# Patient Record
Sex: Female | Born: 1935
Health system: Southern US, Community
[De-identification: ages and names within clinical notes are randomized; demographics above are authoritative.]

## PROBLEM LIST (undated history)

## (undated) DIAGNOSIS — Z803 Family history of malignant neoplasm of breast: Secondary | ICD-10-CM

## (undated) DIAGNOSIS — C50919 Malignant neoplasm of unspecified site of unspecified female breast: Secondary | ICD-10-CM

## (undated) DIAGNOSIS — R011 Cardiac murmur, unspecified: Secondary | ICD-10-CM

## (undated) DIAGNOSIS — H269 Unspecified cataract: Secondary | ICD-10-CM

## (undated) DIAGNOSIS — K253 Acute gastric ulcer without hemorrhage or perforation: Secondary | ICD-10-CM

## (undated) DIAGNOSIS — M25569 Pain in unspecified knee: Secondary | ICD-10-CM

## (undated) DIAGNOSIS — K2960 Other gastritis without bleeding: Secondary | ICD-10-CM

## (undated) DIAGNOSIS — K649 Unspecified hemorrhoids: Secondary | ICD-10-CM

## (undated) DIAGNOSIS — Z800 Family history of malignant neoplasm of digestive organs: Secondary | ICD-10-CM

## (undated) HISTORY — DX: Family history of malignant neoplasm of breast: Z80.3

## (undated) HISTORY — DX: Family history of malignant neoplasm of digestive organs: Z80.0

## (undated) HISTORY — PX: EYE SURGERY: SHX253

## (undated) HISTORY — DX: Unspecified cataract: H26.9

---
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
## 2002-03-02 ENCOUNTER — Emergency Department (HOSPITAL_COMMUNITY): Admission: EM | Admit: 2002-03-02 | Discharge: 2002-03-02 | Payer: Self-pay | Admitting: Emergency Medicine

## 2002-03-02 ENCOUNTER — Encounter: Payer: Self-pay | Admitting: Emergency Medicine

## 2012-06-09 ENCOUNTER — Encounter (HOSPITAL_BASED_OUTPATIENT_CLINIC_OR_DEPARTMENT_OTHER): Payer: Self-pay | Admitting: Emergency Medicine

## 2012-06-09 ENCOUNTER — Emergency Department (HOSPITAL_BASED_OUTPATIENT_CLINIC_OR_DEPARTMENT_OTHER)
Admission: EM | Admit: 2012-06-09 | Discharge: 2012-06-09 | Disposition: A | Discharge status: Home / Self Care | Payer: Medicare Other | Attending: Emergency Medicine | Admitting: Emergency Medicine

## 2012-06-09 ENCOUNTER — Emergency Department (HOSPITAL_BASED_OUTPATIENT_CLINIC_OR_DEPARTMENT_OTHER): Payer: Medicare Other

## 2012-06-09 DIAGNOSIS — S60229A Contusion of unspecified hand, initial encounter: Secondary | ICD-10-CM | POA: Insufficient documentation

## 2012-06-09 DIAGNOSIS — W010XXA Fall on same level from slipping, tripping and stumbling without subsequent striking against object, initial encounter: Secondary | ICD-10-CM | POA: Insufficient documentation

## 2012-06-09 DIAGNOSIS — Y92009 Unspecified place in unspecified non-institutional (private) residence as the place of occurrence of the external cause: Secondary | ICD-10-CM | POA: Insufficient documentation

## 2012-06-09 HISTORY — DX: Pain in unspecified knee: M25.569

## 2012-06-09 MED ORDER — TETANUS-DIPHTH-ACELL PERTUSSIS 5-2.5-18.5 LF-MCG/0.5 IM SUSP
0.5000 mL | Freq: Once | INTRAMUSCULAR | Status: AC
  Administered 2012-06-09: 0.5 mL via INTRAMUSCULAR

## 2012-06-09 MED ORDER — HYDROCODONE-ACETAMINOPHEN 5-325 MG PO TABS
ORAL_TABLET | ORAL | Status: AC
  Administered 2012-06-09: 2 via ORAL
  Filled 2012-06-09: qty 2

## 2012-06-09 MED ORDER — HYDROCODONE-ACETAMINOPHEN 5-325 MG PO TABS: 2.0000 | ORAL_TABLET | ORAL | Status: AC | PRN

## 2012-06-09 MED ORDER — TETANUS-DIPHTH-ACELL PERTUSSIS 5-2.5-18.5 LF-MCG/0.5 IM SUSP
INTRAMUSCULAR | Status: AC
  Administered 2012-06-09: 0.5 mL via INTRAMUSCULAR
  Filled 2012-06-09: qty 0.5

## 2012-06-09 MED ORDER — HYDROCODONE-ACETAMINOPHEN 5-325 MG PO TABS
2.0000 | ORAL_TABLET | Freq: Once | ORAL | Status: AC
  Administered 2012-06-09: 2 via ORAL

## 2012-06-09 NOTE — ED Notes (Addendum)
Per EMS:  Pt fell in garden today.  Pt lost balance while standing in mud.  Pt fell face forward, hit face, caught herself with hands, no LOC.  Pt relates she had some numbness and tingling in lower extremities.  Husband helped to sitting position, numbness was relieved some.  Upon arrival of EMS:  Pt C&A x 4. Pt stated that she has burning sensation in her hands.  No neuro deficits noted.  Pt had some neck soreness, pt has c collar applied.

## 2012-06-09 NOTE — ED Provider Notes (Signed)
History     CSN: [id number]  Arrival date & time 06/09/12  1152   First MD Initiated Contact with Patient 06/09/12 1310      Chief Complaint  Patient presents with  . Fall    (Consider location/radiation/quality/duration/timing/severity/associated sxs/prior treatment) Patient is a 78 y.o. female presenting with fall. The history is provided by the patient. No language interpreter was used.  Fall The accident occurred 3 to 5 hours ago. Incident: pt slipped in mud in her garder. She fell from a height of 3 to 5 ft. She landed on dirt. There was no blood loss. Point of impact: bilat hands. The pain is present in the neck, right wrist and left wrist. The pain is at a severity of 5/10. The pain is moderate. She was not ambulatory at the scene. There was no entrapment after the fall. There was no drug use involved in the accident. The symptoms are aggravated by pressure on the injury. She has tried nothing for the symptoms.   Pt complains of pain in both forearms and hands.  Pt reports she felt numbness from neck down full body. Pt reports she felt like she could not move her extremities. Past Medical History  Diagnosis Date  . Knee pain     Past Surgical History  Procedure Date  . Eye surgery     No family history on file.  History  Substance Use Topics  . Smoking status: Never Smoker   . Smokeless tobacco: Not on file  . Alcohol Use: No    OB History    Grav Para Term Preterm Abortions TAB SAB Ect Mult Living                  Review of Systems  Musculoskeletal: Positive for myalgias. Negative for joint swelling.  All other systems reviewed and are negative.    Allergies  Sulfa antibiotics  Home Medications  No current outpatient prescriptions on file.  BP 157/56  Pulse 68  Temp 98 F (36.7 C) (Oral)  Resp 18  SpO2 96%  Physical Exam  Nursing note and vitals reviewed. Constitutional: She is oriented to person, place, and time. She appears well-developed and  well-nourished.  HENT:  Head: Normocephalic.  Right Ear: External ear normal.  Left Ear: External ear normal.  Eyes: Conjunctivae are normal. Pupils are equal, round, and reactive to light.  Neck: Normal range of motion. Neck supple.  Pulmonary/Chest: Effort normal and breath sounds normal.  Abdominal: Soft. Bowel sounds are normal.  Musculoskeletal: She exhibits tenderness.  Neurological: She is alert and oriented to person, place, and time. She has normal reflexes.  Skin: Skin is warm.  Psychiatric: She has a normal mood and affect.    ED Course  Procedures (including critical care time)  Labs Reviewed - No data to display No results found.   1. Contusion of hand(s)       MDM  xrays no fractures,  Pt removed from collar.   I advised pt to see her MD for recheck in 2-3 days.  Ice to areas of pain.   Pt given rx for hydrocodone        Lonia Skinner [place], [place] 06/09/12 1534  Lonia Skinner [place], [place] 06/09/12 1538

## 2012-06-09 NOTE — ED Notes (Signed)
Cervical collar removed per verbal order of Langston Masker.

## 2012-06-10 NOTE — ED Provider Notes (Signed)
Medical screening examination/treatment/procedure(s) were performed by non-physician practitioner and as supervising physician I was immediately available for consultation/collaboration.   Adriene Padula, MD 06/10/12 0815 

## 2014-01-27 ENCOUNTER — Ambulatory Visit (INDEPENDENT_AMBULATORY_CARE_PROVIDER_SITE_OTHER): Payer: Medicare Other | Admitting: Family Medicine

## 2014-01-27 VITALS — BP 142/80 | HR 84 | Temp 97.0°F | Resp 18 | Ht 61.0 in | Wt 170.2 lb

## 2014-01-27 DIAGNOSIS — H02823 Cysts of right eye, unspecified eyelid: Secondary | ICD-10-CM

## 2014-01-27 DIAGNOSIS — H44009 Unspecified purulent endophthalmitis, unspecified eye: Secondary | ICD-10-CM

## 2014-01-27 DIAGNOSIS — H02829 Cysts of unspecified eye, unspecified eyelid: Secondary | ICD-10-CM

## 2014-01-27 MED ORDER — OFLOXACIN 0.3 % OP SOLN: 2.0000 [drp] | Freq: Four times a day (QID) | OPHTHALMIC | Status: DC

## 2014-01-27 NOTE — Progress Notes (Signed)
Fax copy of OV for referral

## 2014-01-27 NOTE — Progress Notes (Signed)
Chief Complaint:  Chief Complaint  Patient presents with  . Eye Problem    right eye    HPI: Kathy Thornton is a 80 y.o. female who is here for  Right  Eye swelling x 1 week, she started having rednes on the lid and itchiness, but swelling has worsened, she has used otc eye cream. She though it was a stye and has used warm compresses and used visine drops 2-3 times a day. But the last 2-3 days days it started getting more swollen. She denies any fevers or chills. She has had some fuzziness in her right eye since this occurred. Has had some clear drainage. No facial rashes, HA, light sensitivity. No pain. She has had bilateral cataract surgery from 2012 with Dr Darleen Crocker  Past Medical History  Diagnosis Date  . Knee pain   . Cataract    Past Surgical History  Procedure Laterality Date  . Eye surgery     History   Social History  . Marital Status: Married    Spouse Name: N/A    Number of Children: N/A  . Years of Education: N/A   Social History Main Topics  . Smoking status: Never Smoker   . Smokeless tobacco: None  . Alcohol Use: No  . Drug Use: None  . Sexual Activity: None   Other Topics Concern  . None   Social History Narrative  . None   Family History  Problem Relation Age of Onset  . Heart disease Father   . Heart disease Brother    Allergies  Allergen Reactions  . Sulfa Antibiotics Hives   Prior to Admission medications   Medication Sig Start Date End Date Taking? Authorizing Provider  ibuprofen (ADVIL,MOTRIN) 200 MG tablet Take 400 mg by mouth every 6 (six) hours as needed. Patient used this medication for pain.   Yes Historical Provider, MD  aspirin 325 MG tablet Take 325 mg by mouth daily.    Historical Provider, MD     ROS: The patient denies fevers, chills, night sweats, unintentional weight loss, chest pain, palpitations, wheezing, dyspnea on exertion, nausea, vomiting, abdominal pain, dysuria, hematuria, melena, numbness, weakness,  or tingling.   All other systems have been reviewed and were otherwise negative with the exception of those mentioned in the HPI and as above.    PHYSICAL EXAM: Filed Vitals:   01/27/14 0931  BP: 142/80  Pulse: 84  Temp: 97 F (36.1 C)  Resp: 18   Filed Vitals:   01/27/14 0931  Height: 5\' 1"  (1.549 m)  Weight: 170 lb 3.2 oz (77.202 kg)   Body mass index is 32.18 kg/(m^2).  General: Alert, no acute distress HEENT:  Normocephalic, atraumatic, oropharynx patent. EOMI, PERRLA, +right upper eyelid swelling, Fundo exam nl. + 1.5 by 1.5 cm erythematous cyst. ? stye Cardiovascular:  Regular rate and rhythm, no rubs murmurs or gallops.  No Carotid bruits, radial pulse intact. No pedal edema.  Respiratory: Clear to auscultation bilaterally.  No wheezes, rales, or rhonchi.  No cyanosis, no use of accessory musculature GI: No organomegaly, abdomen is soft and non-tender, positive bowel sounds.  No masses. Skin: No rashes. Neurologic: Facial musculature symmetric. Psychiatric: Patient is appropriate throughout our interaction. Lymphatic: No cervical lymphadenopathy Musculoskeletal: Gait intact.   LABS: No results found for this or any previous visit.   EKG/XRAY:   Primary read interpreted by Dr. Marin Comment at [facility].   ASSESSMENT/PLAN: Encounter Diagnoses  Name Primary?  Marland Kitchen  Cyst of right eyelid Yes  . Eye infection    Urgent referral to Dr Talbert Forest Attempted to call but no one picked up in his office She would like to return to see him and not another optho Rx Ocuflox for ppx against infection If she does not hear from [facility] office in 1-2 days then she needs to let me know so I can do referral somewhere else F/u prn  Gross sideeffects, risk and benefits, and alternatives of medications d/w patient. Patient is aware that all medications have potential sideeffects and we are unable to predict every sideeffect or drug-drug interaction that may occur.  LE, [place], DO 01/27/2014 10:17  AM

## 2015-08-12 ENCOUNTER — Ambulatory Visit: Payer: Self-pay | Admitting: Family Medicine

## 2015-09-09 ENCOUNTER — Ambulatory Visit: Payer: Self-pay | Admitting: Family Medicine

## 2015-11-04 ENCOUNTER — Ambulatory Visit (INDEPENDENT_AMBULATORY_CARE_PROVIDER_SITE_OTHER): Payer: Medicare Other | Admitting: Family Medicine

## 2015-11-04 ENCOUNTER — Observation Stay (HOSPITAL_COMMUNITY)
Admission: EM | Admit: 2015-11-04 | Discharge: 2015-11-06 | Disposition: A | Discharge status: Home / Self Care | Payer: Medicare Other | Attending: Internal Medicine | Admitting: Internal Medicine

## 2015-11-04 ENCOUNTER — Ambulatory Visit (INDEPENDENT_AMBULATORY_CARE_PROVIDER_SITE_OTHER): Payer: Medicare Other

## 2015-11-04 ENCOUNTER — Encounter (HOSPITAL_COMMUNITY): Payer: Self-pay | Admitting: Emergency Medicine

## 2015-11-04 VITALS — BP 138/62 | HR 65 | Temp 98.6°F | Resp 16 | Ht 60.5 in | Wt 173.0 lb

## 2015-11-04 DIAGNOSIS — R3915 Urgency of urination: Secondary | ICD-10-CM | POA: Diagnosis not present

## 2015-11-04 DIAGNOSIS — R011 Cardiac murmur, unspecified: Secondary | ICD-10-CM | POA: Diagnosis not present

## 2015-11-04 DIAGNOSIS — N390 Urinary tract infection, site not specified: Secondary | ICD-10-CM | POA: Diagnosis not present

## 2015-11-04 DIAGNOSIS — K640 First degree hemorrhoids: Secondary | ICD-10-CM | POA: Diagnosis not present

## 2015-11-04 DIAGNOSIS — K449 Diaphragmatic hernia without obstruction or gangrene: Secondary | ICD-10-CM | POA: Diagnosis not present

## 2015-11-04 DIAGNOSIS — R351 Nocturia: Secondary | ICD-10-CM | POA: Diagnosis not present

## 2015-11-04 DIAGNOSIS — Z791 Long term (current) use of non-steroidal anti-inflammatories (NSAID): Secondary | ICD-10-CM | POA: Insufficient documentation

## 2015-11-04 DIAGNOSIS — M25562 Pain in left knee: Secondary | ICD-10-CM

## 2015-11-04 DIAGNOSIS — Z79899 Other long term (current) drug therapy: Secondary | ICD-10-CM | POA: Insufficient documentation

## 2015-11-04 DIAGNOSIS — H269 Unspecified cataract: Secondary | ICD-10-CM | POA: Diagnosis not present

## 2015-11-04 DIAGNOSIS — K259 Gastric ulcer, unspecified as acute or chronic, without hemorrhage or perforation: Secondary | ICD-10-CM | POA: Insufficient documentation

## 2015-11-04 DIAGNOSIS — D509 Iron deficiency anemia, unspecified: Secondary | ICD-10-CM | POA: Diagnosis not present

## 2015-11-04 DIAGNOSIS — Z130 Encounter for screening for diseases of the blood and blood-forming organs and certain disorders involving the immune mechanism: Secondary | ICD-10-CM

## 2015-11-04 DIAGNOSIS — M25561 Pain in right knee: Secondary | ICD-10-CM | POA: Diagnosis not present

## 2015-11-04 DIAGNOSIS — K2950 Unspecified chronic gastritis without bleeding: Secondary | ICD-10-CM | POA: Insufficient documentation

## 2015-11-04 DIAGNOSIS — Z23 Encounter for immunization: Secondary | ICD-10-CM | POA: Diagnosis not present

## 2015-11-04 DIAGNOSIS — K2980 Duodenitis without bleeding: Secondary | ICD-10-CM | POA: Insufficient documentation

## 2015-11-04 DIAGNOSIS — M170 Bilateral primary osteoarthritis of knee: Secondary | ICD-10-CM

## 2015-11-04 DIAGNOSIS — Z131 Encounter for screening for diabetes mellitus: Secondary | ICD-10-CM | POA: Diagnosis not present

## 2015-11-04 DIAGNOSIS — M129 Arthropathy, unspecified: Secondary | ICD-10-CM

## 2015-11-04 DIAGNOSIS — R739 Hyperglycemia, unspecified: Secondary | ICD-10-CM | POA: Diagnosis not present

## 2015-11-04 DIAGNOSIS — N3000 Acute cystitis without hematuria: Secondary | ICD-10-CM

## 2015-11-04 DIAGNOSIS — K2960 Other gastritis without bleeding: Secondary | ICD-10-CM | POA: Diagnosis present

## 2015-11-04 DIAGNOSIS — K649 Unspecified hemorrhoids: Secondary | ICD-10-CM | POA: Diagnosis present

## 2015-11-04 DIAGNOSIS — D649 Anemia, unspecified: Secondary | ICD-10-CM | POA: Diagnosis not present

## 2015-11-04 DIAGNOSIS — R600 Localized edema: Secondary | ICD-10-CM | POA: Insufficient documentation

## 2015-11-04 DIAGNOSIS — R0989 Other specified symptoms and signs involving the circulatory and respiratory systems: Secondary | ICD-10-CM | POA: Diagnosis present

## 2015-11-04 DIAGNOSIS — K253 Acute gastric ulcer without hemorrhage or perforation: Secondary | ICD-10-CM | POA: Diagnosis present

## 2015-11-04 HISTORY — DX: Other gastritis without bleeding: K29.60

## 2015-11-04 HISTORY — DX: Unspecified hemorrhoids: K64.9

## 2015-11-04 HISTORY — DX: Acute gastric ulcer without hemorrhage or perforation: K25.3

## 2015-11-04 LAB — CBC
HCT: 17.9 % — ABNORMAL LOW (ref 36.0–46.0)
Hemoglobin: 4.9 g/dL — CL (ref 12.0–15.0)
MCH: 17.9 pg — AB (ref 26.0–34.0)
MCHC: 27.4 g/dL — AB (ref 30.0–36.0)
MCV: 65.3 fL — AB (ref 78.0–100.0)
MPV: 9.5 fL (ref 8.6–12.4)
PLATELETS: 247 10*3/uL (ref 150–400)
RBC: 2.74 MIL/uL — ABNORMAL LOW (ref 3.87–5.11)
RDW: 19.3 % — ABNORMAL HIGH (ref 11.5–15.5)
WBC: 5.1 10*3/uL (ref 4.0–10.5)

## 2015-11-04 LAB — POCT URINALYSIS DIP (MANUAL ENTRY)
BILIRUBIN UA: NEGATIVE
BILIRUBIN UA: NEGATIVE
Glucose, UA: NEGATIVE
Nitrite, UA: POSITIVE — AB
PH UA: 5
PROTEIN UA: NEGATIVE
SPEC GRAV UA: 1.02
Urobilinogen, UA: 0.2

## 2015-11-04 LAB — COMPREHENSIVE METABOLIC PANEL
ALK PHOS: 80 U/L (ref 33–130)
ALT: 8 U/L (ref 6–29)
AST: 10 U/L (ref 10–35)
Albumin: 3.9 g/dL (ref 3.6–5.1)
BILIRUBIN TOTAL: 0.3 mg/dL (ref 0.2–1.2)
BUN: 16 mg/dL (ref 7–25)
CO2: 19 mmol/L — ABNORMAL LOW (ref 20–31)
CREATININE: 1.18 mg/dL — AB (ref 0.60–0.93)
Calcium: 8.7 mg/dL (ref 8.6–10.4)
Chloride: 107 mmol/L (ref 98–110)
Glucose, Bld: 87 mg/dL (ref 65–99)
Potassium: 4.2 mmol/L (ref 3.5–5.3)
SODIUM: 137 mmol/L (ref 135–146)
TOTAL PROTEIN: 6.7 g/dL (ref 6.1–8.1)

## 2015-11-04 LAB — POC MICROSCOPIC URINALYSIS (UMFC): Mucus: ABSENT

## 2015-11-04 LAB — GLUCOSE, POCT (MANUAL RESULT ENTRY): POC GLUCOSE: 128 mg/dL — AB (ref 70–99)

## 2015-11-04 MED ORDER — MELOXICAM 7.5 MG PO TABS: 7.5000 mg | ORAL_TABLET | Freq: Every day | ORAL | Status: DC

## 2015-11-04 MED ORDER — TRAMADOL HCL 50 MG PO TABS: 50.0000 mg | ORAL_TABLET | Freq: Three times a day (TID) | ORAL | Status: DC | PRN

## 2015-11-04 MED ORDER — CEPHALEXIN 500 MG PO CAPS: 500.0000 mg | ORAL_CAPSULE | Freq: Two times a day (BID) | ORAL | Status: DC

## 2015-11-04 NOTE — ED Notes (Signed)
Pt sent by MD Edilia Bo for low hemoglobin. Was told tonight that "she needed blood right now and could not wait until the morning." Denies bleeding from rectum or hematuria. Has had intermittent dizzy spells recently. Pt reports increased fatigue recently. No other c/c. A&Ox4.

## 2015-11-04 NOTE — Progress Notes (Addendum)
[facility] [address], [place] [address] [phone]  Date:  11/04/2015   Name:  Kathy Thornton   DOB:  20-Jan-1934   MRN:  [id number]  PCP:  No primary care provider on file.    Chief Complaint: Follow-up; Diabetes; knee pain; and urination   History of Present Illness:  Kathy Thornton is a 82 y.o. very pleasant female patient who presents with the following:  Here today with a few concerns. They are concerned that she could have hyperglycemia and would like [place] to check her glucose She has noted nocturia- over the last 3 months she has noted 1-2 episodes of nocturia nightly.  She feels like her urine does not come out enough- like she has to go again shortly after voiding.   No dysuria No hematuria.    Also she has noted some knee pain- both knees hurt, the right is worse than the left She also has some sciatica on the right.  Her knees have hurt "for a long time."   She feels certain that she has arthritis.  She uses a cane but is able to get around well still.    She does not take any medications.  She has been generally healthy and does not get regular medical care.    There are no active problems to display for this patient.   Past Medical History  Diagnosis Date  . Knee pain   . Cataract     Past Surgical History  Procedure Laterality Date  . Eye surgery      Social History  Substance Use Topics  . Smoking status: Never Smoker   . Smokeless tobacco: Not on file  . Alcohol Use: No    Family History  Problem Relation Age of Onset  . Heart disease Father   . Heart disease Brother     Allergies  Allergen Reactions  . Sulfa Antibiotics Hives    Medication list has been reviewed and updated.  Current Outpatient Prescriptions on File Prior to Visit  Medication Sig Dispense Refill  . ibuprofen (ADVIL,MOTRIN) 200 MG tablet Take 400 mg by mouth every 6 (six) hours as needed. Patient used this medication for pain.    Marland Kitchen aspirin  325 MG tablet Take 325 mg by mouth daily.     No current facility-administered medications on file prior to visit.    Review of Systems:  As per HPI- otherwise negative.   Physical Examination: Filed Vitals:   11/04/15 1312  BP: 138/62  Pulse: 65  Temp: 98.6 F (37 C)  Resp: 16   Filed Vitals:   11/04/15 1312  Height: 5' 0.5" (1.537 m)  Weight: 173 lb (78.472 kg)   Body mass index is 33.22 kg/(m^2). Ideal Body Weight: Weight in (lb) to have BMI = 25: 129.9  GEN: WDWN, NAD, Non-toxic, A & O x 3, overweight, looks well HEENT: Atraumatic, Normocephalic. Neck supple. No masses, No LAD.  Bilateral TM wnl, oropharynx normal.  PEERL,EOMI.   Ears and Nose: No external deformity. CV: RRR, No M/G/R. No JVD. No thrill. No extra heart sounds. PULM: CTA B, no wheezes, crackles, rhonchi. No retractions. No resp. distress. No accessory muscle use. EXTR: No c/c/e NEURO uses a cane, no visible limp but she walks somewhat slowly PSYCH: Normally interactive. Conversant. Not depressed or anxious appearing.  Calm demeanor.  Right knee: crepitus, knee is stable, no effusion.  Negative SLR Left knee:crepitus, stable, no effusion  [facility] reading (  PRIMARY) by  Dr. Lorelei Pont. Right knee: severe medial compartment OA, moderate OA in other compartments Left knee: severe OA in all compartments   Results for orders placed or performed in visit on 11/04/15  POCT glucose (manual entry)  Result Value Ref Range   POC Glucose 128 (A) 70 - 99 mg/dl  POCT urinalysis dipstick  Result Value Ref Range   Color, UA yellow yellow   Clarity, UA clear clear   Glucose, UA negative negative   Bilirubin, UA negative negative   Ketones, POC UA negative negative   Spec Grav, UA 1.020    Blood, UA small (A) negative   pH, UA 5.0    Protein Ur, POC negative negative   Urobilinogen, UA 0.2    Nitrite, UA Positive (A) Negative   Leukocytes, UA large (3+) (A) Negative  POCT Microscopic Urinalysis ([facility])  Result  Value Ref Range   WBC,UR,HPF,POC Moderate (A) None WBC/hpf   RBC,UR,HPF,POC Moderate (A) None RBC/hpf   Bacteria Few (A) None, Too numerous to count   Mucus Absent Absent   Epithelial Cells, UR Per Microscopy Few (A) None, Too numerous to count cells/hpf    Assessment and Plan: Arthralgia of both knees - Plan: DG Knee Complete 4 Views Right, DG Knee Complete 4 Views Left, meloxicam (MOBIC) 7.5 MG tablet, traMADol (ULTRAM) 50 MG tablet, Ambulatory referral to Orthopedic Surgery  Nocturia - Plan: Urine culture  Urinary urgency - Plan: POCT urinalysis dipstick, POCT Microscopic Urinalysis ([facility]), Urine culture, cephALEXin (KEFLEX) 500 MG capsule  Screening for diabetes mellitus - Plan: Comprehensive metabolic panel, POCT glucose (manual entry), Hemoglobin A1c, CANCELED: POCT glycosylated hemoglobin (Hb A1C)  Screening for deficiency anemia - Plan: CBC  Immunization due - Plan: Flu Vaccine QUAD 36+ mos IM, Pneumococcal conjugate vaccine 13-valent IM  Severe OA of her knees. She might be interested in surgery, ?synvisc.  Will refer to ortho No history of CV or GI problems- will rx mobic for prn use and tramadol for more severe pain DM seems unlikely but await A1c Treat for a UTI with keflex, await culture Immunizations and labs today  Signed Lamar Blinks, MD  Received a call from [facility] around 9:30 pm- critically low hg of 4.9. Called the only number that we have for pt- it is her home number.  No answer- LMOM that she needs to proceed to the ER for treatment right away.   Called twice more- no answer.  Then was able to reach her at approx 10pm- discussed. Her anemia is quite unexpected.  I recommended that she go to the ER now to have this rechecked and evaluated/ treated if real.  Advised her NOT to use any further mobic until this is sorted out.  She states understanding  Lab Results  Component Value Date   WBC 5.1 11/04/2015   HGB 4.9* 11/04/2015   HCT 17.9* 11/04/2015   MCV  65.3* 11/04/2015   PLT 247 11/04/2015   12/3-  Received her culture- she has been admitted for the last few days for anemia Her urine culture does not specify if her particular organism is sensitive to keflex- will change her to augmentin to be sure.  LMOM- stop the keflex and start augmentin for 5 days

## 2015-11-04 NOTE — Patient Instructions (Addendum)
I will be in touch with the rest of your labs. Your random glucose looks ok but I will check an A1c test to get more information and make sure that you do not have diabetes.  It does appear that you have a urinary tract infection which may be causing your urinary symptoms.   You have severe arthritis in your knees.  We will refer you to orthopedics to look at your knees In the meantime use the mobic as needed for everyday pain, and the tramadol as needed for more severe pain Try to keep moving- this will help you knees

## 2015-11-04 NOTE — ED Provider Notes (Signed)
CSN: [id number]     Arrival date & time 11/04/15  2254 History  By signing my name below, I, Meriel Pica, attest that this documentation has been prepared under the direction and in the presence of Everlene Balls, MD. Electronically Signed: Meriel Pica, ED Scribe. 11/04/2015. 11:23 PM.  Chief Complaint  Patient presents with  . Low Hemoglobin   . Sent by MD    The history is provided by the patient. No language interpreter was used.   HPI Comments: Kathy Thornton is a 81 y.o. female who presents to the Emergency Department sent by her PCP Dr. Lorelei Pont for evaluation of low hemoglobin that resulted at 4.9 from labs obtained at PCP visit today. Pt reports she was evaluated at Dr. Lillie Fragmin office today for a yearly physical when blood work was drawn and she received a call from her PCP approximately 1 hour ago to present to the ED for recheck of hg lab and possible treatment. Daughter also notes the pt was found to have a mild UTI but her A1C was within normal limits. Pt reports associated waxing and waning fatigue recently but has been able to go about her daily routine. She is not on blood thinning medication. Denies melena, hematochezia, hematuria, or any other associated symptoms. The pt has arthralgias in bilateral knees at baseline due to arthritis. Denies any recent SOB or light-headedness.   Past Medical History  Diagnosis Date  . Knee pain   . Cataract    Past Surgical History  Procedure Laterality Date  . Eye surgery     Family History  Problem Relation Age of Onset  . Heart disease Father   . Heart disease Brother    Social History  Substance Use Topics  . Smoking status: Never Smoker   . Smokeless tobacco: None  . Alcohol Use: No   OB History    No data available     Review of Systems  Constitutional: Positive for fatigue. Negative for fever.  Respiratory: Negative for shortness of breath.   Gastrointestinal: Negative for blood in stool.  Genitourinary:  Negative for hematuria.  Musculoskeletal: Positive for arthralgias ( B/L kness).  Neurological: Negative for light-headedness.  Hematological: Does not bruise/bleed easily.   10 Systems reviewed and all are negative for acute change except as noted in the HPI.  Allergies  Sulfa antibiotics  Home Medications   Prior to Admission medications   Medication Sig Start Date End Date Taking? Authorizing Provider  Aspirin-Acetaminophen-Caffeine (GOODY HEADACHE PO) Take 2 Packages by mouth 2 (two) times daily as needed (pain).   Yes Historical Provider, MD  cephALEXin (KEFLEX) 500 MG capsule Take 1 capsule (500 mg total) by mouth 2 (two) times daily. Patient taking differently: Take 500 mg by mouth 2 (two) times daily. For 7 days 11-30 to 12-7 11/04/15  Yes Jessica C Copland, MD  Cyanocobalamin (VITAMIN B 12 PO) Take 1 tablet by mouth daily.    Yes Historical Provider, MD  glucosamine-chondroitin 500-400 MG tablet Take 1 tablet by mouth daily.   Yes Historical Provider, MD  ibuprofen (ADVIL,MOTRIN) 200 MG tablet Take 400 mg by mouth every 6 (six) hours as needed for moderate pain.   Yes Historical Provider, MD  meloxicam (MOBIC) 7.5 MG tablet Take 1 tablet (7.5 mg total) by mouth daily. Use as needed for pain 11/04/15  Yes Gay Filler Copland, MD  Multiple Vitamins-Minerals (HAIR/SKIN/NAILS PO) Take 1 tablet by mouth 3 (three) times daily.   Yes Historical Provider, MD  traMADol (ULTRAM) 50 MG tablet Take 1 tablet (50 mg total) by mouth every 8 (eight) hours as needed. Use for more severe knee pain Patient taking differently: Take 50 mg by mouth every 8 (eight) hours as needed for moderate pain.  11/04/15   Gay Filler Copland, MD   BP 162/48 mmHg  Pulse 85  Temp(Src) 98.1 F (36.7 C) (Oral)  Resp 16  SpO2 100% Physical Exam  Constitutional: She is oriented to person, place, and time. She appears well-developed and well-nourished. No distress.  HENT:  Head: Normocephalic and atraumatic.  Nose:  Nose normal.  Mouth/Throat: Oropharynx is clear and moist. No oropharyngeal exudate.  Eyes: EOM are normal. Pupils are equal, round, and reactive to light. No scleral icterus.  Pale conjunctiva bilaterally.   Neck: Normal range of motion. Neck supple. No JVD present. No tracheal deviation present. No thyromegaly present.  Cardiovascular: Normal rate, regular rhythm and normal heart sounds.  Exam reveals no gallop and no friction rub.   No murmur heard. Pulmonary/Chest: Effort normal and breath sounds normal. No respiratory distress. She has no wheezes. She exhibits no tenderness.  Abdominal: Soft. Bowel sounds are normal. She exhibits no distension and no mass. There is no tenderness. There is no rebound and no guarding.  Genitourinary: Guaiac negative stool.  Hemoccult negative.   Musculoskeletal: Normal range of motion. She exhibits no edema or tenderness.  Lymphadenopathy:    She has no cervical adenopathy.  Neurological: She is alert and oriented to person, place, and time. No cranial nerve deficit. She exhibits normal muscle tone.  Skin: Skin is warm and dry. No rash noted. No erythema. No pallor.  Nursing note and vitals reviewed.   ED Course  Procedures  DIAGNOSTIC STUDIES: Oxygen Saturation is 100% on RA, normal by my interpretation.    COORDINATION OF CARE: 11:20 PM Discussed treatment plan which includes to order diagnostic labs with pt. Pt acknowledges and agrees to plan.  12:17 AM Labs resulted. Hemoglobin at 5.1. Will order blood transfusion and consult with medicine.   Labs Review Labs Reviewed  CBC WITH DIFFERENTIAL/PLATELET - Abnormal; Notable for the following:    RBC 2.82 (*)    Hemoglobin 5.1 (*)    HCT 19.5 (*)    MCV 69.1 (*)    MCH 18.1 (*)    MCHC 26.2 (*)    RDW 19.6 (*)    All other components within normal limits  COMPREHENSIVE METABOLIC PANEL  MAGNESIUM  PHOSPHORUS  IRON AND TIBC  RETICULOCYTES  VITAMIN B12  FOLATE  FERRITIN  POC OCCULT BLOOD,  ED  TYPE AND SCREEN  ABO/RH  PREPARE RBC (CROSSMATCH)    Imaging Review Dg Knee Complete 4 Views Left  11/04/2015  CLINICAL DATA:  Left knee pain and arthralgia.  No known injury. EXAM: LEFT KNEE - COMPLETE 4+ VIEW COMPARISON:  None. FINDINGS: There is no evidence of fracture, dislocation, or joint effusion. Severe tricompartmental osteoarthritis is seen. No other bone lesions identified. IMPRESSION: No acute findings. Severe tricompartmental osteoarthritis. Electronically Signed   By: Earle Gell M.D.   On: 11/04/2015 16:36   Dg Knee Complete 4 Views Right  11/04/2015  CLINICAL DATA:  Right knee pain. Right knee arthralgia. No known injury. EXAM: RIGHT KNEE - COMPLETE 4+ VIEW COMPARISON:  None. FINDINGS: There is no evidence of fracture, dislocation, or joint effusion. Severe tricompartmental osteoarthritis is seen. No other bone lesions identified. IMPRESSION: No acute findings. Severe tricompartmental osteoarthritis. Electronically Signed   By: Jenny Reichmann  Kris Hartmann M.D.   On: 11/04/2015 16:35   I have personally reviewed and evaluated these images and lab results as part of my medical decision-making.   MDM   Final diagnoses:  None   Patient presents to the ED for low hgb at PCP office.  It was repeated here and found to be 5.1.  2 units have been ordered and patient will be admitted to the hospitalist for further care.   CRITICAL CARE Performed by: Everlene Balls   Total critical care time: 40 minutes - anemia with transfusion  Critical care time was exclusive of separately billable procedures and treating other patients.  Critical care was necessary to treat or prevent imminent or life-threatening deterioration.  Critical care was time spent personally by me on the following activities: development of treatment plan with patient and/or surrogate as well as nursing, discussions with consultants, evaluation of patient's response to treatment, examination of patient, obtaining history from  patient or surrogate, ordering and performing treatments and interventions, ordering and review of laboratory studies, ordering and review of radiographic studies, pulse oximetry and re-evaluation of patient's condition.   I personally performed the services described in this documentation, which was scribed in my presence. The recorded information has been reviewed and is accurate.     Everlene Balls, MD 11/05/15 [phone]

## 2015-11-05 ENCOUNTER — Observation Stay (HOSPITAL_COMMUNITY): Payer: Medicare Other

## 2015-11-05 ENCOUNTER — Encounter (HOSPITAL_COMMUNITY): Payer: Self-pay | Admitting: Internal Medicine

## 2015-11-05 DIAGNOSIS — K259 Gastric ulcer, unspecified as acute or chronic, without hemorrhage or perforation: Secondary | ICD-10-CM | POA: Diagnosis not present

## 2015-11-05 DIAGNOSIS — R011 Cardiac murmur, unspecified: Secondary | ICD-10-CM | POA: Diagnosis present

## 2015-11-05 DIAGNOSIS — N390 Urinary tract infection, site not specified: Secondary | ICD-10-CM

## 2015-11-05 DIAGNOSIS — D509 Iron deficiency anemia, unspecified: Secondary | ICD-10-CM | POA: Diagnosis not present

## 2015-11-05 DIAGNOSIS — D649 Anemia, unspecified: Secondary | ICD-10-CM | POA: Diagnosis present

## 2015-11-05 DIAGNOSIS — K449 Diaphragmatic hernia without obstruction or gangrene: Secondary | ICD-10-CM | POA: Diagnosis not present

## 2015-11-05 DIAGNOSIS — K640 First degree hemorrhoids: Secondary | ICD-10-CM | POA: Diagnosis not present

## 2015-11-05 DIAGNOSIS — R600 Localized edema: Secondary | ICD-10-CM | POA: Diagnosis present

## 2015-11-05 DIAGNOSIS — R0989 Other specified symptoms and signs involving the circulatory and respiratory systems: Secondary | ICD-10-CM | POA: Diagnosis present

## 2015-11-05 LAB — CBC WITH DIFFERENTIAL/PLATELET
BASOS ABS: 0.1 10*3/uL (ref 0.0–0.1)
Basophils Absolute: 0.1 10*3/uL (ref 0.0–0.1)
Basophils Relative: 1 %
Basophils Relative: 1 %
EOS PCT: 3 %
Eosinophils Absolute: 0.2 10*3/uL (ref 0.0–0.7)
Eosinophils Absolute: 0.2 10*3/uL (ref 0.0–0.7)
Eosinophils Relative: 4 %
HEMATOCRIT: 19.5 % — AB (ref 36.0–46.0)
HEMATOCRIT: 26.9 % — AB (ref 36.0–46.0)
Hemoglobin: 5.1 g/dL — CL (ref 12.0–15.0)
Hemoglobin: 7.7 g/dL — ABNORMAL LOW (ref 12.0–15.0)
LYMPHS ABS: 1.8 10*3/uL (ref 0.7–4.0)
LYMPHS ABS: 1.1 10*3/uL (ref 0.7–4.0)
LYMPHS PCT: 30 %
LYMPHS PCT: 18 %
MCH: 18.1 pg — AB (ref 26.0–34.0)
MCH: 20.7 pg — ABNORMAL LOW (ref 26.0–34.0)
MCHC: 26.2 g/dL — AB (ref 30.0–36.0)
MCHC: 28.6 g/dL — ABNORMAL LOW (ref 30.0–36.0)
MCV: 69.1 fL — AB (ref 78.0–100.0)
MCV: 72.3 fL — ABNORMAL LOW (ref 78.0–100.0)
MONOS PCT: 9 %
MONOS PCT: 10 %
Monocytes Absolute: 0.5 10*3/uL (ref 0.1–1.0)
Monocytes Absolute: 0.6 10*3/uL (ref 0.1–1.0)
NEUTROS PCT: 67 %
Neutro Abs: 3.5 10*3/uL (ref 1.7–7.7)
Neutro Abs: 4.1 10*3/uL (ref 1.7–7.7)
Neutrophils Relative %: 57 %
Platelets: 222 10*3/uL (ref 150–400)
Platelets: 198 10*3/uL (ref 150–400)
RBC: 2.82 MIL/uL — AB (ref 3.87–5.11)
RBC: 3.72 MIL/uL — AB (ref 3.87–5.11)
RDW: 19.6 % — AB (ref 11.5–15.5)
RDW: 20.1 % — AB (ref 11.5–15.5)
WBC: 6.1 10*3/uL (ref 4.0–10.5)
WBC: 6.1 10*3/uL (ref 4.0–10.5)

## 2015-11-05 LAB — URINE MICROSCOPIC-ADD ON

## 2015-11-05 LAB — URINALYSIS, ROUTINE W REFLEX MICROSCOPIC
Bilirubin Urine: NEGATIVE
Glucose, UA: NEGATIVE mg/dL
KETONES UR: NEGATIVE mg/dL
NITRITE: NEGATIVE
PH: 5.5 (ref 5.0–8.0)
PROTEIN: NEGATIVE mg/dL
Specific Gravity, Urine: 1.007 (ref 1.005–1.030)

## 2015-11-05 LAB — RETICULOCYTES
RBC.: 2.59 MIL/uL — AB (ref 3.87–5.11)
RETIC CT PCT: 1.7 % (ref 0.4–3.1)
Retic Count, Absolute: 44 10*3/uL (ref 19.0–186.0)

## 2015-11-05 LAB — COMPREHENSIVE METABOLIC PANEL
ALBUMIN: 3.8 g/dL (ref 3.5–5.0)
ALT: 10 U/L — ABNORMAL LOW (ref 14–54)
ALT: 11 U/L — ABNORMAL LOW (ref 14–54)
ANION GAP: 6 (ref 5–15)
ANION GAP: 8 (ref 5–15)
AST: 14 U/L — AB (ref 15–41)
AST: 15 U/L (ref 15–41)
Albumin: 4 g/dL (ref 3.5–5.0)
Alkaline Phosphatase: 77 U/L (ref 38–126)
Alkaline Phosphatase: 84 U/L (ref 38–126)
BILIRUBIN TOTAL: 0.9 mg/dL (ref 0.3–1.2)
BUN: 15 mg/dL (ref 6–20)
BUN: 21 mg/dL — AB (ref 6–20)
CHLORIDE: 111 mmol/L (ref 101–111)
CO2: 20 mmol/L — AB (ref 22–32)
CO2: 22 mmol/L (ref 22–32)
Calcium: 8.8 mg/dL — ABNORMAL LOW (ref 8.9–10.3)
Calcium: 9 mg/dL (ref 8.9–10.3)
Chloride: 112 mmol/L — ABNORMAL HIGH (ref 101–111)
Creatinine, Ser: 1.12 mg/dL — ABNORMAL HIGH (ref 0.44–1.00)
Creatinine, Ser: 0.99 mg/dL (ref 0.44–1.00)
GFR calc Af Amer: 53 mL/min — ABNORMAL LOW (ref >60)
GFR calc non Af Amer: 45 mL/min — ABNORMAL LOW (ref >60)
GFR, EST NON AFRICAN AMERICAN: 53 mL/min — AB (ref >60)
GLUCOSE: 101 mg/dL — AB (ref 65–99)
Glucose, Bld: 101 mg/dL — ABNORMAL HIGH (ref 65–99)
POTASSIUM: 4 mmol/L (ref 3.5–5.1)
POTASSIUM: 4 mmol/L (ref 3.5–5.1)
SODIUM: 139 mmol/L (ref 135–145)
Sodium: 140 mmol/L (ref 135–145)
TOTAL PROTEIN: 6.8 g/dL (ref 6.5–8.1)
TOTAL PROTEIN: 7 g/dL (ref 6.5–8.1)
Total Bilirubin: 0.5 mg/dL (ref 0.3–1.2)

## 2015-11-05 LAB — VITAMIN B12: Vitamin B-12: 469 pg/mL (ref 180–914)

## 2015-11-05 LAB — PHOSPHORUS: Phosphorus: 2.9 mg/dL (ref 2.5–4.6)

## 2015-11-05 LAB — FERRITIN: Ferritin: 3 ng/mL — ABNORMAL LOW (ref 11–307)

## 2015-11-05 LAB — FOLATE: FOLATE: 21.1 ng/mL (ref >5.9)

## 2015-11-05 LAB — MAGNESIUM: Magnesium: 2.2 mg/dL (ref 1.7–2.4)

## 2015-11-05 LAB — IRON AND TIBC
IRON: 14 ug/dL — AB (ref 28–170)
SATURATION RATIOS: 3 % — AB (ref 10.4–31.8)
TIBC: 545 ug/dL — AB (ref 250–450)
UIBC: 531 ug/dL

## 2015-11-05 LAB — ABO/RH: ABO/RH(D): A POS

## 2015-11-05 LAB — PREPARE RBC (CROSSMATCH)

## 2015-11-05 MED ORDER — PANTOPRAZOLE SODIUM 40 MG IV SOLR
40.0000 mg | Freq: Two times a day (BID) | INTRAVENOUS | Status: DC
  Administered 2015-11-05 (×2): 40 mg via INTRAVENOUS
  Filled 2015-11-05 (×4): qty 40

## 2015-11-05 MED ORDER — CIPROFLOXACIN HCL 250 MG PO TABS
250.0000 mg | ORAL_TABLET | Freq: Two times a day (BID) | ORAL | Status: DC
  Administered 2015-11-05: 250 mg via ORAL
  Filled 2015-11-05 (×4): qty 1

## 2015-11-05 MED ORDER — TRAMADOL HCL 50 MG PO TABS: 50.0000 mg | ORAL_TABLET | Freq: Three times a day (TID) | ORAL | Status: DC | PRN

## 2015-11-05 MED ORDER — SODIUM CHLORIDE 0.9 % IV SOLN: 250.0000 mg | INTRAVENOUS | Status: DC

## 2015-11-05 MED ORDER — SODIUM CHLORIDE 0.9 % IV SOLN
INTRAVENOUS | Status: DC
  Administered 2015-11-05 (×2): via INTRAVENOUS

## 2015-11-05 MED ORDER — ACETAMINOPHEN 325 MG PO TABS
650.0000 mg | ORAL_TABLET | Freq: Once | ORAL | Status: AC
Start: 2015-11-05 — End: 2015-11-05
  Administered 2015-11-05: 650 mg via ORAL
  Filled 2015-11-05: qty 2

## 2015-11-05 MED ORDER — DIPHENHYDRAMINE HCL 25 MG PO CAPS: 25.0000 mg | ORAL_CAPSULE | Freq: Once | ORAL | Status: DC

## 2015-11-05 MED ORDER — SODIUM CHLORIDE 0.9 % IV SOLN
250.0000 mg | Freq: Once | INTRAVENOUS | Status: AC
  Administered 2015-11-05: 250 mg via INTRAVENOUS
  Filled 2015-11-05: qty 20

## 2015-11-05 MED ORDER — FUROSEMIDE 10 MG/ML IJ SOLN
20.0000 mg | Freq: Once | INTRAMUSCULAR | Status: AC
  Administered 2015-11-05: 20 mg via INTRAVENOUS
  Filled 2015-11-05: qty 2

## 2015-11-05 MED ORDER — DEXTROSE 5 % IV SOLN
1.0000 g | INTRAVENOUS | Status: DC
  Administered 2015-11-05: 1 g via INTRAVENOUS
  Filled 2015-11-05 (×2): qty 10

## 2015-11-05 MED ORDER — PEG-KCL-NACL-NASULF-NA ASC-C 100 G PO SOLR
0.5000 | Freq: Once | ORAL | Status: AC
  Administered 2015-11-06: 100 g via ORAL

## 2015-11-05 MED ORDER — ONDANSETRON HCL 4 MG PO TABS: 4.0000 mg | ORAL_TABLET | Freq: Four times a day (QID) | ORAL | Status: DC | PRN

## 2015-11-05 MED ORDER — PANTOPRAZOLE SODIUM 40 MG IV SOLR
40.0000 mg | INTRAVENOUS | Status: DC
  Administered 2015-11-05: 40 mg via INTRAVENOUS
  Filled 2015-11-05 (×2): qty 40

## 2015-11-05 MED ORDER — PEG-KCL-NACL-NASULF-NA ASC-C 100 G PO SOLR
0.5000 | Freq: Once | ORAL | Status: AC
  Administered 2015-11-05: 100 g via ORAL
  Filled 2015-11-05: qty 1

## 2015-11-05 MED ORDER — SODIUM CHLORIDE 0.9 % IV SOLN
Freq: Once | INTRAVENOUS | Status: AC
  Administered 2015-11-05: 13:00:00 via INTRAVENOUS

## 2015-11-05 MED ORDER — ONDANSETRON HCL 4 MG/2ML IJ SOLN: 4.0000 mg | Freq: Four times a day (QID) | INTRAMUSCULAR | Status: DC | PRN

## 2015-11-05 MED ORDER — PEG-KCL-NACL-NASULF-NA ASC-C 100 G PO SOLR: 1.0000 | Freq: Once | ORAL | Status: DC

## 2015-11-05 MED ORDER — SODIUM CHLORIDE 0.9 % IV SOLN
10.0000 mL/h | Freq: Once | INTRAVENOUS | Status: AC
  Administered 2015-11-05: 10 mL/h via INTRAVENOUS

## 2015-11-05 NOTE — Progress Notes (Signed)
I was seen and assessed patient and agree with Dr Olevia Bowens assessment and plan. Patient presented with symptomatic microcytic iron deficiency anemia. Hemoglobin on admission was 5.1 from 4.9 from PCPs office. Patient also noted to have a UTI. Anemia panel consistent with iron deficiency anemia with a iron level of 14 and ferritin of 3. Patient is status post 2 units packed red blood cells hemoglobin currently at 7.7. Will transfuse another 2 units of packed red blood cells. Will also place on IV iron. Will likely need oral iron supplementation on discharge. Consult with GI for further evaluation and management.

## 2015-11-05 NOTE — Anesthesia Preprocedure Evaluation (Addendum)
Anesthesia Evaluation  Patient identified by MRN, date of birth, ID band Patient awake    Reviewed: Allergy & Precautions, NPO status , Patient's Chart, lab work & pertinent test results  Airway Mallampati: II  TM Distance: >3 FB Neck ROM: Full    Dental   Pulmonary neg pulmonary ROS,    breath sounds clear to auscultation       Cardiovascular negative cardio ROS   Rhythm:Regular Rate:Normal     Neuro/Psych negative neurological ROS     GI/Hepatic negative GI ROS, Neg liver ROS,   Endo/Other  negative endocrine ROS  Renal/GU negative Renal ROS     Musculoskeletal  (+) Arthritis ,   Abdominal   Peds  Hematology  (+) anemia ,   Anesthesia Other Findings   Reproductive/Obstetrics                            Lab Results  Component Value Date   WBC 5.7 11/06/2015   HGB 10.3* 11/06/2015   HCT 33.8* 11/06/2015   MCV 76.1* 11/06/2015   PLT 182 11/06/2015   Lab Results  Component Value Date   CREATININE 1.03* 11/06/2015   BUN 13 11/06/2015   NA 142 11/06/2015   K 3.8 11/06/2015   CL 113* 11/06/2015   CO2 20* 11/06/2015    Anesthesia Physical Anesthesia Plan  ASA: III  Anesthesia Plan: MAC   Post-op Pain Management:    Induction: Intravenous  Airway Management Planned: Natural Airway and Simple Face Mask  Additional Equipment:   Intra-op Plan:   Post-operative Plan:   Informed Consent: I have reviewed the patients History and Physical, chart, labs and discussed the procedure including the risks, benefits and alternatives for the proposed anesthesia with the patient or authorized representative who has indicated his/her understanding and acceptance.     Plan Discussed with: CRNA  Anesthesia Plan Comments:         Anesthesia Quick Evaluation

## 2015-11-05 NOTE — Progress Notes (Signed)
MEDICATION RELATED CONSULT NOTE - INITIAL   Pharmacy Consult for IV Iron Indication: Iron deficiency anemia  Allergies  Allergen Reactions  . Sulfa Antibiotics Hives    Patient Measurements: Height: 5' 0.5" (153.7 cm) Weight: 173 lb (78.472 kg) IBW/kg (Calculated) : 46.65  Vital Signs: Temp: 98.1 F (36.7 C) (12/01 1447) Temp Source: Oral (12/01 1447) BP: 154/74 mmHg (12/01 1447) Pulse Rate: 67 (12/01 1447) Intake/Output from previous day: 11/30 0701 - 12/01 0700 In: 538.8 [P.O.:240; Blood:298.8] Out: -  Intake/Output from this shift: Total I/O In: 1048 [P.O.:600; Blood:448] Out: -   Labs:  Recent Labs  11/04/15 1347 11/04/15 2320 11/05/15 0026 11/05/15 1040  WBC 5.1 6.1  --  6.1  HGB 4.9* 5.1*  --  7.7*  HCT 17.9* 19.5*  --  26.9*  PLT 247 222  --  198  CREATININE 1.18*  --  1.12* 0.99  MG  --   --  2.2  --   PHOS  --   --  2.9  --   ALBUMIN 3.9  --  3.8 4.0  PROT 6.7  --  6.8 7.0  AST 10  --  14* 15  ALT 8  --  10* 11*  ALKPHOS 80  --  77 84  BILITOT 0.3  --  0.5 0.9   Estimated Creatinine Clearance: 43.2 mL/min (by C-G formula based on Cr of 0.99).   Microbiology: No results found for this or any previous visit (from the past 720 hour(s)).  Medical History: Past Medical History  Diagnosis Date  . Knee pain   . Cataract     Assessment: 47 y/oF with PMH of OA of knees who was found to have Hemoglobin of 4.9 g/dL at MD office and was therefore sent to the ED. Patient has been using NSAIDs in the form of Goody powder and ibuprofen regularly for quite some time at home. Pt was heme negative Patient received 2 units PRBCs and Hgb increased to 7.7. Now has order for 2 more units PRBCs. Iron panel shows low iron stores (see below). Pharmacy asked to assist with dosing of IV iron for this patient. GI consulted and plans for colonscopy/EGD tomorrow.    Ref. Range 11/05/2015 00:26  Iron Latest Ref Range: 28-170 ug/dL 14 (L)  UIBC Latest Units: ug/dL 531   TIBC Latest Ref Range: 250-450 ug/dL 545 (H)  Saturation Ratios Latest Ref Range: 10.4-31.8 % 3 (L)  Ferritin Latest Ref Range: 11-307 ng/mL 3 (L)  Folate Latest Ref Range: >5.9 ng/mL 21.1    Goal of Therapy:  Repletion of iron stores  Plan:   Ferric Gluconate 250 mg IV x 1. Repeat q48h up to a maximum of 4 doses (1g total).    Thank you for the consult.   Lindell Spar, PharmD, BCPS Pager: [phone] 11/05/2015 3:02 PM

## 2015-11-05 NOTE — Progress Notes (Signed)
  Echocardiogram 2D Echocardiogram has been performed.  Kathy Thornton 11/05/2015, 3:43 PM

## 2015-11-05 NOTE — Consult Note (Signed)
 Referring Provider: No ref. provider found Primary Care Physician:  COPLAND,JESSICA, MD Primary Gastroenterologist:  Unassigned   Reason for Consultation:  Symptomatic anemia  HPI: Kathy Thornton is a 81 y.o. female with a past medical history of osteoarthritis of the knees and cataracts who was referred by Dr. Jessica Copland to come to the emergency department last night after routine labs came back with the patient having a hemoglobin level of 4.9 g/dL. The patient states that she has been feeling more tired than usual over the past few months.  Never had colonoscopy in the past.  Was apparently scheduled with Dr. Mann on a couple of occasions some years ago but never had it performed and has now been discharged from the practice.  Was using Goodie powders, 2 or 3 daily, for several months until a few weeks ago when she discontinued those, but started using Ibuprofen instead, but not as frequently.    She denies any rectal bleeding or black stools.  Was actually heme negative in the ED.  Denies any GI complaints.  Has received 2 units PRBC's and Hgb is up to 7.7 grams so is going to receive 2 more.  Iron studies also very low with ferritin of 3 and % sat of 3%.   Past Medical History  Diagnosis Date  . Knee pain   . Cataract     Past Surgical History  Procedure Laterality Date  . Eye surgery      Prior to Admission medications   Medication Sig Start Date End Date Taking? Authorizing Provider  Aspirin-Acetaminophen-Caffeine (GOODY HEADACHE PO) Take 2 Packages by mouth 2 (two) times daily as needed (pain).   Yes Historical Provider, MD  cephALEXin (KEFLEX) 500 MG capsule Take 1 capsule (500 mg total) by mouth 2 (two) times daily. Patient taking differently: Take 500 mg by mouth 2 (two) times daily. For 7 days 11-30 to 12-7 11/04/15  Yes Jessica C Copland, MD  Cyanocobalamin (VITAMIN B 12 PO) Take 1 tablet by mouth daily.    Yes Historical Provider, MD  glucosamine-chondroitin  500-400 MG tablet Take 1 tablet by mouth daily.   Yes Historical Provider, MD  ibuprofen (ADVIL,MOTRIN) 200 MG tablet Take 400 mg by mouth every 6 (six) hours as needed for moderate pain.   Yes Historical Provider, MD  meloxicam (MOBIC) 7.5 MG tablet Take 1 tablet (7.5 mg total) by mouth daily. Use as needed for pain 11/04/15  Yes Jessica C Copland, MD  Multiple Vitamins-Minerals (HAIR/SKIN/NAILS PO) Take 1 tablet by mouth 3 (three) times daily.   Yes Historical Provider, MD  traMADol (ULTRAM) 50 MG tablet Take 1 tablet (50 mg total) by mouth every 8 (eight) hours as needed. Use for more severe knee pain Patient taking differently: Take 50 mg by mouth every 8 (eight) hours as needed for moderate pain.  11/04/15   Jessica C Copland, MD    Current Facility-Administered Medications  Medication Dose Route Frequency Provider Last Rate Last Dose  . 0.9 %  sodium chloride infusion   Intravenous Continuous David Manuel Ortiz, MD 25 mL/hr at 11/05/15 1104    . cefTRIAXone (ROCEPHIN) 1 g in dextrose 5 % 50 mL IVPB  1 g Intravenous Q24H Avionna Bower Thompson V, MD   1 g at 11/05/15 1108  . ondansetron (ZOFRAN) tablet 4 mg  4 mg Oral Q6H PRN David Manuel Ortiz, MD       Or  . ondansetron (ZOFRAN) injection 4 mg  4 mg Intravenous Q6H   PRN David Manuel Ortiz, MD      . pantoprazole (PROTONIX) injection 40 mg  40 mg Intravenous Q12H Marchia Diguglielmo Thompson V, MD   40 mg at 11/05/15 1112  . peg 3350 powder (MOVIPREP) kit 100 g  0.5 kit Oral Once Lyra Alaimo Thompson V, MD       And  . [START ON 11/06/2015] peg 3350 powder (MOVIPREP) kit 100 g  0.5 kit Oral Once Rosia Syme Thompson V, MD      . traMADol (ULTRAM) tablet 50 mg  50 mg Oral Q8H PRN David Manuel Ortiz, MD        Allergies as of 11/04/2015 - Review Complete 11/04/2015  Allergen Reaction Noted  . Sulfa antibiotics Hives 06/09/2012    Family History  Problem Relation Age of Onset  . Heart disease Father   . Heart disease Brother     Social History   Social History   . Marital Status: Married    Spouse Name: N/A  . Number of Children: N/A  . Years of Education: N/A   Occupational History  . Not on file.   Social History Main Topics  . Smoking status: Never Smoker   . Smokeless tobacco: Not on file  . Alcohol Use: No  . Drug Use: Not on file  . Sexual Activity: Not on file   Other Topics Concern  . Not on file   Social History Narrative    Review of Systems: Ten point ROS is O/W negative except as mentioned in HPI.  Physical Exam: Vital signs in last 24 hours: Temp:  [97.7 F (36.5 C)-98.7 F (37.1 C)] 98.5 F (36.9 C) (12/01 0918) Pulse Rate:  [62-85] 64 (12/01 0918) Resp:  [14-20] 16 (12/01 0918) BP: (105-162)/(45-64) 126/51 mmHg (12/01 0918) SpO2:  [100 %] 100 % (12/01 0918) Weight:  [173 lb (78.472 kg)] 173 lb (78.472 kg) (12/01 0120) Last BM Date: 11/04/15 General:  Alert, Well-developed, well-nourished, pleasant and cooperative in NAD Head:  Normocephalic and atraumatic. Eyes:  Sclera clear, no icterus.  Conjunctiva pink. Ears:  Normal auditory acuity. Mouth:  No deformity or lesions.   Lungs:  Clear throughout to auscultation.  No wheezes, crackles, or rhonchi.  Heart:  Regular rate and rhythm; no murmurs, clicks, rubs, or gallops. Abdomen:  Soft, non-distended.  BS present.  Non-tender.   Rectal:  Deferred.  Heme negative in ED.  Msk:  Symmetrical without gross deformities. Pulses:  Normal pulses noted. Extremities:  Without clubbing or edema. Neurologic:  Alert and  oriented x4;  grossly normal neurologically. Skin:  Intact without significant lesions or rashes. Psych:  Alert and cooperative. Normal mood and affect.  Intake/Output from previous day: 11/30 0701 - 12/01 0700 In: 538.8 [P.O.:240; Blood:298.8] Out: -  Intake/Output this shift: Total I/O In: 688 [P.O.:240; Blood:448] Out: -   Lab Results:  Recent Labs  11/04/15 1347 11/04/15 2320 11/05/15 1040  WBC 5.1 6.1 6.1  HGB 4.9* 5.1* 7.7*  HCT  17.9* 19.5* 26.9*  PLT 247 222 198   BMET  Recent Labs  11/04/15 1347 11/05/15 0026 11/05/15 1040  NA 137 139 140  K 4.2 4.0 4.0  CL 107 111 112*  CO2 19* 20* 22  GLUCOSE 87 101* 101*  BUN 16 21* 15  CREATININE 1.18* 1.12* 0.99  CALCIUM 8.7 8.8* 9.0   LFT  Recent Labs  11/05/15 1040  PROT 7.0  ALBUMIN 4.0  AST 15  ALT 11*  ALKPHOS 84  BILITOT 0.9   Studies/Results:   Dg Knee Complete 4 Views Left  11/04/2015  CLINICAL DATA:  Left knee pain and arthralgia.  No known injury. EXAM: LEFT KNEE - COMPLETE 4+ VIEW COMPARISON:  None. FINDINGS: There is no evidence of fracture, dislocation, or joint effusion. Severe tricompartmental osteoarthritis is seen. No other bone lesions identified. IMPRESSION: No acute findings. Severe tricompartmental osteoarthritis. Electronically Signed   By: John  Stahl M.D.   On: 11/04/2015 16:36   Dg Knee Complete 4 Views Right  11/04/2015  CLINICAL DATA:  Right knee pain. Right knee arthralgia. No known injury. EXAM: RIGHT KNEE - COMPLETE 4+ VIEW COMPARISON:  None. FINDINGS: There is no evidence of fracture, dislocation, or joint effusion. Severe tricompartmental osteoarthritis is seen. No other bone lesions identified. IMPRESSION: No acute findings. Severe tricompartmental osteoarthritis. Electronically Signed   By: John  Stahl M.D.   On: 11/04/2015 16:35   IMPRESSION/PLAN:  -Symptomatic IDA:  Hgb very low as well as iron studies.  Received 2 units PCBCs and is going to receive 2 more.  Will likely need iron supplements upon discharge and may want to consider iron infusion while she is here.  Uses NSAID's in the form of Goody powders and Ibuprofen regularly for quite some time.  She is heme negative with no signs of overt bleeding at any time according to patient's report.  Could do procedures as outpatient, however, there is question of compliance and adherence for follow-up by patient's daughter.  Will plan for inpatient EGD and colonoscopy at 8 AM on  12/2.  ZEHR, JESSICA D.  11/05/2015, 1:48 PM  Pager number [phone]     ________________________________________________________________________  [facility] MD note:  I personally examined the patient, reviewed the data and agree with the assessment and plan described above.  81 yo woman with microcytic, IDA without overt GI bleeding. Takes frequent NSAIDs and has never had colon cancer screening. Hb 7.7 after 2 units yesterday. She will be getting more blood and will prep for colonoscopy/EGD tomorrow AM.   Anaid Haney, MD [facility] Pager [phone]  

## 2015-11-05 NOTE — H&P (Signed)
[facility] History and Physical  Kathy Thornton [id number] DOB: February 22, 1934 DOA: 11/04/2015  Referring physician: Everlene Balls, MD PCP: Lamar Blinks, MD   Chief Complaint: Low blood count.  HPI: Kathy Thornton is a 81 y.o. female with a past medical history of osteoarthritis of the knees, cataracts who was referred by Dr. Janett Billow Copland to come to the emergency department after routine labs came back with the patient having a hemoglobin level of 4.9 g/dL. The patient states that she has been feeling more tired than usual over the past few months. She she noticed that about 8 months ago she started having postprandial abdominal pain, but this subsequently resolve on its own. She denies any recent symptoms, except for Friday after Thanksgiving when she had indigestion symptoms, followed by laxative induced diarrhea. This episode was also self-limited. She denies nausea, emesis, melena or hematochezia. She has occasional constipation.   She uses ibuprofen 200 mg, 2 tablets once a day, but not every day for knee pain. She uses on occasion Goody headaches packs which contain aspirin. About 2 or 3 months ago, there was a period when the patient was using 2 packs once or twice a day. She last used 2 packs earlier today, but had not used them in some time.   Patient also complains of nocturnal frequency and a urinalysis done today at the office show positive nitrites and leukocyte esterase. She denies fever, chills, dysuria, hematuria or oliguria.   Review of Systems:  Constitutional:  Positive fatigue.  No weight loss, night sweats, Fevers, chills.  HEENT:  No headaches, Difficulty swallowing,Tooth/dental problems,Sore throat,  No sneezing, itching, ear ache, nasal congestion, post nasal drip,  Cardio-vascular:  Positive for occasional pitting edema of the lower extremities No chest pain, Orthopnea, PND,  anasarca, dizziness, palpitations  GI: Positive indigestion after  Thanksgiving dinner last week, followed by laxative induced diarrhea on Friday. abdominal pain, nausea, vomiting,  change in bowel habits, loss of appetite  Resp:  Dyspnea with exertion, but denies cough, hemoptysis or wheezing. Skin:  no rash or lesions.  GU:  Positive nocturnal frequency in recent weeks. no dysuria, change in color of urine. No flank pain.  Musculoskeletal:  Daily bilateral knee arthralgias with mild decreased in range of motion. No back pain.  Psych:  No change in mood or affect. No depression or anxiety. No memory loss.   Past Medical History  Diagnosis Date  . Knee pain   . Cataract    Past Surgical History  Procedure Laterality Date  . Eye surgery     Social History:  reports that she has never smoked. She does not have any smokeless tobacco history on file. She reports that she does not drink alcohol. Her drug history is not on file.  Allergies  Allergen Reactions  . Sulfa Antibiotics Hives    Family History  Problem Relation Age of Onset  . Heart disease Father   . Heart disease Brother     Prior to Admission medications   Medication Sig Start Date End Date Taking? Authorizing Provider  Aspirin-Acetaminophen-Caffeine (GOODY HEADACHE PO) Take 2 Packages by mouth 2 (two) times daily as needed (pain).   Yes Historical Provider, MD  cephALEXin (KEFLEX) 500 MG capsule Take 1 capsule (500 mg total) by mouth 2 (two) times daily. Patient taking differently: Take 500 mg by mouth 2 (two) times daily. For 7 days 11-30 to 12-7 11/04/15  Yes Gay Filler Copland, MD  Cyanocobalamin (VITAMIN B 12 PO) Take  1 tablet by mouth daily.    Yes Historical Provider, MD  glucosamine-chondroitin 500-400 MG tablet Take 1 tablet by mouth daily.   Yes Historical Provider, MD  ibuprofen (ADVIL,MOTRIN) 200 MG tablet Take 400 mg by mouth every 6 (six) hours as needed for moderate pain.   Yes Historical Provider, MD  meloxicam (MOBIC) 7.5 MG tablet Take 1 tablet (7.5 mg total) by  mouth daily. Use as needed for pain 11/04/15  Yes Gay Filler Copland, MD  Multiple Vitamins-Minerals (HAIR/SKIN/NAILS PO) Take 1 tablet by mouth 3 (three) times daily.   Yes Historical Provider, MD  traMADol (ULTRAM) 50 MG tablet Take 1 tablet (50 mg total) by mouth every 8 (eight) hours as needed. Use for more severe knee pain Patient taking differently: Take 50 mg by mouth every 8 (eight) hours as needed for moderate pain.  11/04/15   Darreld Mclean, MD   Physical Exam: Filed Vitals:   11/04/15 2304  BP: 162/48  Pulse: 85  Temp: 98.1 F (36.7 C)  TempSrc: Oral  Resp: 16  SpO2: 100%    Wt Readings from Last 3 Encounters:  11/04/15 78.472 kg (173 lb)  01/27/14 77.202 kg (170 lb 3.2 oz)    General:  Appears calm and comfortable Eyes: PERRL, normal lids, irises, pale conjunctiva. ENT: grossly normal hearing, lips and oral mucosa are moist. Neck: no LAD, masses or thyromegaly Cardiovascular: RRR, positive diastolic murmur. 2+ bilateral LE edema. Telemetry: SR, no arrhythmias  Respiratory: CTA bilaterally, no w/r/r. Normal respiratory effort. Abdomen: Bowel sounds positive, soft, ntnd Skin: no rash or induration seen on limited exam Musculoskeletal: grossly normal tone BUE/BLE Psychiatric: grossly normal mood and affect, speech fluent and appropriate Neurologic: grossly non-focal.          Labs on Admission:  Basic Metabolic Panel:  Recent Labs Lab 11/04/15 1347  NA 137  K 4.2  CL 107  CO2 19*  GLUCOSE 87  BUN 16  CREATININE 1.18*  CALCIUM 8.7   Liver Function Tests:  Recent Labs Lab 11/04/15 1347  AST 10  ALT 8  ALKPHOS 80  BILITOT 0.3  PROT 6.7  ALBUMIN 3.9   CBC:  Recent Labs Lab 11/04/15 1347 11/04/15 2320  WBC 5.1 6.1  NEUTROABS  --  3.5  HGB 4.9* 5.1*  HCT 17.9* 19.5*  MCV 65.3* 69.1*  PLT 247 222    Radiological Exams on Admission: Dg Knee Complete 4 Views Left  11/04/2015  CLINICAL DATA:  Left knee pain and arthralgia.  No known  injury. EXAM: LEFT KNEE - COMPLETE 4+ VIEW COMPARISON:  None. FINDINGS: There is no evidence of fracture, dislocation, or joint effusion. Severe tricompartmental osteoarthritis is seen. No other bone lesions identified. IMPRESSION: No acute findings. Severe tricompartmental osteoarthritis. Electronically Signed   By: Earle Gell M.D.   On: 11/04/2015 16:36   Dg Knee Complete 4 Views Right  11/04/2015  CLINICAL DATA:  Right knee pain. Right knee arthralgia. No known injury. EXAM: RIGHT KNEE - COMPLETE 4+ VIEW COMPARISON:  None. FINDINGS: There is no evidence of fracture, dislocation, or joint effusion. Severe tricompartmental osteoarthritis is seen. No other bone lesions identified. IMPRESSION: No acute findings. Severe tricompartmental osteoarthritis. Electronically Signed   By: Earle Gell M.D.   On: 11/04/2015 16:35    EKG: Independently reviewed. Ordered status.   Assessment/Plan Principal Problem:   Symptomatic anemia Admit to MedSurg. Continue blood transfusion ordered by the emergency department. Follow-up post transfusion hematocrit and hemoglobin. Check anemia workup.  Consider GI consult as an evening or outpatient.  Active Problems:   Arthritis of both knees I advised the patient and her relatives against the use of NSAIDs. Continue acetazolamide and Phenergan and tramadol as needed for pain. The patient may benefit from orthopedic surgery evaluation as an outpatient at some point in the future.    UTI (lower urinary tract infection) Start ciprofloxacin 500 mg by mouth twice a day. Follow-up urine culture and sensitivity that is currently in process.    Heart murmur   Lower extremity edema   Widened pulse pressure Per patient, she does not have a previous history of a heart murmur. So this may be as a result of her anemia. She has occasional pitting edema of the lower extremities, which has been more frequent recently, and she attributes to standing or sitting for long  periods of time. Check echocardiogram and monitor blood pressure.    Code Status: Full code. DVT Prophylaxis: Mechanical with SCDs. Family Communication: Her husband and daughters were present in the room. Disposition Plan: Admit for blood transfusion and anemia workup.  Time spent: Over 70 minutes were spent during the process of this admission.  Reubin Milan [facility] Pager [phone].

## 2015-11-06 ENCOUNTER — Observation Stay (HOSPITAL_COMMUNITY): Payer: Medicare Other | Admitting: Anesthesiology

## 2015-11-06 ENCOUNTER — Encounter (HOSPITAL_COMMUNITY)
Admission: EM | Disposition: A | Discharge status: Home / Self Care | Payer: Self-pay | Source: Home / Self Care | Attending: Internal Medicine

## 2015-11-06 ENCOUNTER — Encounter (HOSPITAL_COMMUNITY): Payer: Self-pay | Admitting: Anesthesiology

## 2015-11-06 DIAGNOSIS — K253 Acute gastric ulcer without hemorrhage or perforation: Secondary | ICD-10-CM

## 2015-11-06 DIAGNOSIS — Z791 Long term (current) use of non-steroidal anti-inflammatories (NSAID): Secondary | ICD-10-CM | POA: Diagnosis not present

## 2015-11-06 DIAGNOSIS — K2900 Acute gastritis without bleeding: Secondary | ICD-10-CM

## 2015-11-06 DIAGNOSIS — K449 Diaphragmatic hernia without obstruction or gangrene: Secondary | ICD-10-CM | POA: Diagnosis not present

## 2015-11-06 DIAGNOSIS — D509 Iron deficiency anemia, unspecified: Principal | ICD-10-CM

## 2015-11-06 DIAGNOSIS — K649 Unspecified hemorrhoids: Secondary | ICD-10-CM

## 2015-11-06 DIAGNOSIS — K259 Gastric ulcer, unspecified as acute or chronic, without hemorrhage or perforation: Secondary | ICD-10-CM | POA: Diagnosis not present

## 2015-11-06 DIAGNOSIS — K2970 Gastritis, unspecified, without bleeding: Secondary | ICD-10-CM | POA: Diagnosis not present

## 2015-11-06 DIAGNOSIS — K2950 Unspecified chronic gastritis without bleeding: Secondary | ICD-10-CM | POA: Diagnosis not present

## 2015-11-06 DIAGNOSIS — K2980 Duodenitis without bleeding: Secondary | ICD-10-CM | POA: Diagnosis not present

## 2015-11-06 DIAGNOSIS — K2960 Other gastritis without bleeding: Secondary | ICD-10-CM | POA: Diagnosis present

## 2015-11-06 DIAGNOSIS — K640 First degree hemorrhoids: Secondary | ICD-10-CM | POA: Diagnosis not present

## 2015-11-06 DIAGNOSIS — D649 Anemia, unspecified: Secondary | ICD-10-CM | POA: Diagnosis not present

## 2015-11-06 DIAGNOSIS — R011 Cardiac murmur, unspecified: Secondary | ICD-10-CM

## 2015-11-06 HISTORY — PX: ESOPHAGOGASTRODUODENOSCOPY (EGD) WITH PROPOFOL: SHX5813

## 2015-11-06 HISTORY — DX: Acute gastric ulcer without hemorrhage or perforation: K25.3

## 2015-11-06 HISTORY — DX: Other gastritis without bleeding: K29.60

## 2015-11-06 HISTORY — PX: COLONOSCOPY WITH PROPOFOL: SHX5780

## 2015-11-06 HISTORY — DX: Unspecified hemorrhoids: K64.9

## 2015-11-06 LAB — TYPE AND SCREEN
ABO/RH(D): A POS
Antibody Screen: NEGATIVE
UNIT DIVISION: 0
UNIT DIVISION: 0
UNIT DIVISION: 0
Unit division: 0

## 2015-11-06 LAB — BASIC METABOLIC PANEL
Anion gap: 9 (ref 5–15)
BUN: 13 mg/dL (ref 6–20)
CO2: 20 mmol/L — ABNORMAL LOW (ref 22–32)
CREATININE: 1.03 mg/dL — AB (ref 0.44–1.00)
Calcium: 9.1 mg/dL (ref 8.9–10.3)
Chloride: 113 mmol/L — ABNORMAL HIGH (ref 101–111)
GFR calc Af Amer: 58 mL/min — ABNORMAL LOW (ref >60)
GFR, EST NON AFRICAN AMERICAN: 50 mL/min — AB (ref >60)
GLUCOSE: 93 mg/dL (ref 65–99)
Potassium: 3.8 mmol/L (ref 3.5–5.1)
SODIUM: 142 mmol/L (ref 135–145)

## 2015-11-06 LAB — CBC
HCT: 33.8 % — ABNORMAL LOW (ref 36.0–46.0)
Hemoglobin: 10.3 g/dL — ABNORMAL LOW (ref 12.0–15.0)
MCH: 23.2 pg — AB (ref 26.0–34.0)
MCHC: 30.5 g/dL (ref 30.0–36.0)
MCV: 76.1 fL — AB (ref 78.0–100.0)
PLATELETS: 182 10*3/uL (ref 150–400)
RBC: 4.44 MIL/uL (ref 3.87–5.11)
RDW: 19.8 % — AB (ref 11.5–15.5)
WBC: 5.7 10*3/uL (ref 4.0–10.5)

## 2015-11-06 LAB — URINE CULTURE: CULTURE: NO GROWTH

## 2015-11-06 LAB — HEMOGLOBIN A1C
Hgb A1c MFr Bld: 4.9 % (ref <5.7)
MEAN PLASMA GLUCOSE: 94 mg/dL (ref <117)

## 2015-11-06 SURGERY — ESOPHAGOGASTRODUODENOSCOPY (EGD) WITH PROPOFOL
Anesthesia: Monitor Anesthesia Care

## 2015-11-06 MED ORDER — PROPOFOL 10 MG/ML IV BOLUS
INTRAVENOUS | Status: DC | PRN
  Administered 2015-11-06: 80 mg via INTRAVENOUS

## 2015-11-06 MED ORDER — SODIUM CHLORIDE 0.9 % IV SOLN: INTRAVENOUS | Status: DC

## 2015-11-06 MED ORDER — POLYSACCHARIDE IRON COMPLEX 150 MG PO CAPS: 150.0000 mg | ORAL_CAPSULE | Freq: Every day | ORAL | Status: DC

## 2015-11-06 MED ORDER — PANTOPRAZOLE SODIUM 40 MG PO TBEC
40.0000 mg | DELAYED_RELEASE_TABLET | Freq: Every day | ORAL | Status: DC
  Administered 2015-11-06: 40 mg via ORAL
  Filled 2015-11-06 (×4): qty 1

## 2015-11-06 MED ORDER — POLYSACCHARIDE IRON COMPLEX 150 MG PO CAPS
150.0000 mg | ORAL_CAPSULE | Freq: Every day | ORAL | Status: DC
  Filled 2015-11-06: qty 1

## 2015-11-06 MED ORDER — PROPOFOL 10 MG/ML IV BOLUS
INTRAVENOUS | Status: AC
  Filled 2015-11-06: qty 40

## 2015-11-06 MED ORDER — PROPOFOL 500 MG/50ML IV EMUL
INTRAVENOUS | Status: DC | PRN
  Administered 2015-11-06: 100 ug/kg/min via INTRAVENOUS

## 2015-11-06 MED ORDER — LACTATED RINGERS IV SOLN
INTRAVENOUS | Status: DC
  Administered 2015-11-06: 08:00:00 via INTRAVENOUS

## 2015-11-06 MED ORDER — PANTOPRAZOLE SODIUM 40 MG PO TBEC: 40.0000 mg | DELAYED_RELEASE_TABLET | Freq: Every day | ORAL | Status: DC

## 2015-11-06 SURGICAL SUPPLY — 25 items

## 2015-11-06 NOTE — Interval H&P Note (Signed)
History and Physical Interval Note:  11/06/2015 8:00 AM  Kathy Thornton  has presented today for surgery, with the diagnosis of Iron deficiency anemia; NSAID use  The various methods of treatment have been discussed with the patient and family. After consideration of risks, benefits and other options for treatment, the patient has consented to  Procedure(s): ESOPHAGOGASTRODUODENOSCOPY (EGD) WITH PROPOFOL (N/A) COLONOSCOPY WITH PROPOFOL (N/A) as a surgical intervention .  The patient's history has been reviewed, patient examined, no change in status, stable for surgery.  I have reviewed the patient's chart and labs.  Questions were answered to the patient's satisfaction.     Pricilla Riffle. Fuller Plan

## 2015-11-06 NOTE — Anesthesia Postprocedure Evaluation (Signed)
Anesthesia Post Note  Patient: Kathy Thornton  Procedure(s) Performed: Procedure(s) (LRB): ESOPHAGOGASTRODUODENOSCOPY (EGD) WITH PROPOFOL (N/A) COLONOSCOPY WITH PROPOFOL (N/A)  Patient location during evaluation: PACU Anesthesia Type: MAC Level of consciousness: awake and alert Pain management: pain level controlled Vital Signs Assessment: post-procedure vital signs reviewed and stable Respiratory status: spontaneous breathing Cardiovascular status: blood pressure returned to baseline Postop Assessment: no signs of nausea or vomiting Anesthetic complications: no    Last Vitals:  Filed Vitals:   11/06/15 0915 11/06/15 1019  BP: 147/59 150/60  Pulse: 59 63  Temp: 36.7 C 36.4 C  Resp: 18 18    Last Pain:  Filed Vitals:   11/06/15 1019  PainSc: Asleep                 Tiajuana Amass

## 2015-11-06 NOTE — Op Note (Signed)
[place], [id number]   ENDOSCOPY PROCEDURE REPORT  PATIENT: Kathy, Thornton  MR#: [id number] BIRTHDATE: December 11, 1933 , 60  yrs. old GENDER: female ENDOSCOPIST: Ladene Artist, MD, [facility] REFERRED BY:  [facility], [facility] PROCEDURE DATE:  11/06/2015 PROCEDURE:  EGD w/ biopsy ASA CLASS:     Class II INDICATIONS:  iron deficiency anemia. MEDICATIONS: Monitored anesthesia care, Residual sedation present, and Per Anesthesia TOPICAL ANESTHETIC: none DESCRIPTION OF PROCEDURE: After the risks benefits and alternatives of the procedure were thoroughly explained, informed consent was obtained.  The Pentax Gastroscope [id number] endoscope was introduced through the mouth and advanced to the second portion of the duodenum , Without limitations.  The instrument was slowly withdrawn as the mucosa was fully examined.  STOMACH: Multiple cameron's erosions were found in the cardia.  A single non-bleeding, clean-based, round and shallow ulcer measuring 4 x 29mm in size was found in the gastric antrum.  Biopsies were taken at edge of the ulcer.   Moderate erosive gastritis  was found in the gastric body and gastric antrum.   The stomach otherwise appeared normal. DUODENUM: The duodenal mucosa showed no abnormalities in the bulb and 2nd part of the duodenum.  Cold forceps biopsies were taken in the bulb and second portion. ESOPHAGUS: The mucosa of the esophagus appeared normal.  Retroflexed views revealed a 6 cm hiatal hernia.     The scope was then withdrawn from the patient and the procedure completed.  COMPLICATIONS: There were no immediate complications.  ENDOSCOPIC IMPRESSION: 1.   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   Single ulcer in the gastric antrum; biopsies were taken 3.   Erosive gastritis in the gastric body and gastric antrum 4.   6 cm hiatal hernia  RECOMMENDATIONS: 1.  Await pathology results 2.  Avoid NSAIDS 3.  PPI qam long  term 4.  Anticipate long term blood loss from [facility] erosions. Fe replacement likely will be needed long term per PCP. 5.  GI follow up with Dr. Ardis Hughs as needed  eSigned:  Ladene Artist, MD, [facility] 11/06/2015 8:53 AM

## 2015-11-06 NOTE — Op Note (Signed)
[place], [id number]   COLONOSCOPY PROCEDURE REPORT  PATIENT: Kathy Thornton, Kathy Thornton  MR#: [id number] BIRTHDATE: [birth year] , 18  yrs. old GENDER: female ENDOSCOPIST: Ladene Artist, MD, [facility] REFERRED [id number], [facility] PROCEDURE DATE:  11/06/2015 PROCEDURE:   Colonoscopy, diagnostic First Screening Colonoscopy - Avg.  risk and is 50 yrs.  old or older - No.  Prior Negative Screening - Now for repeat screening. N/A  History of Adenoma - Now for follow-up colonoscopy & has been > or = to 3 yrs.  N/A  Polyps removed today? No Recommend repeat exam, <10 yrs? No ASA CLASS:   Class II INDICATIONS:iron deficiency anemia. MEDICATIONS: Monitored anesthesia care and Per Anesthesia DESCRIPTION OF PROCEDURE:   After the risks benefits and alternatives of the procedure were thoroughly explained, informed consent was obtained.  The digital rectal exam revealed no abnormalities of the rectum.   The Pentax Ped Colon [id number] endoscope was introduced through the anus and advanced to the cecum, which was identified by both the appendix and ileocecal valve. No adverse events experienced.   The quality of the prep was excellent.  (Suprep was used)  The instrument was then slowly withdrawn as the colon was fully examined. Estimated blood loss is zero unless otherwise noted in this procedure report.    COLON FINDINGS: A normal appearing cecum, ileocecal valve, and appendiceal orifice were identified.  The ascending, transverse, descending, sigmoid colon, and rectum appeared unremarkable. Retroflexed views revealed internal Grade I hemorrhoids. The time to cecum = 3.0 Withdrawal time = 15.2   The scope was withdrawn and the procedure completed. COMPLICATIONS: There were no immediate complications.  ENDOSCOPIC IMPRESSION: 1.  Normal colonoscopy 2.  Grade I internal hemorrhoids  RECOMMENDATIONS: Given your age, you will not need another colonoscopy for  colon cancer screening or polyp surveillance.  These types of tests usually stop around the age 36.  eSigned:  Ladene Artist, MD, [facility] 11/06/2015 8:42 AM

## 2015-11-06 NOTE — H&P (View-Only) (Signed)
Referring Provider: No ref. provider found Primary Care Physician:  Lamar Blinks, MD Primary Gastroenterologist:  Althia Forts   Reason for Consultation:  Symptomatic anemia  HPI: Kathy Thornton is a 81 y.o. female with a past medical history of osteoarthritis of the knees and cataracts who was referred by Dr. Janett Billow Copland to come to the emergency department last night after routine labs came back with the patient having a hemoglobin level of 4.9 g/dL. The patient states that she has been feeling more tired than usual over the past few months.  Never had colonoscopy in the past.  Was apparently scheduled with Dr. Collene Mares on a couple of occasions some years ago but never had it performed and has now been discharged from the practice.  Was using Goodie powders, 2 or 3 daily, for several months until a few weeks ago when she discontinued those, but started using Ibuprofen instead, but not as frequently.    She denies any rectal bleeding or black stools.  Was actually heme negative in the ED.  Denies any GI complaints.  Has received 2 units PRBC's and Hgb is up to 7.7 grams so is going to receive 2 more.  Iron studies also very low with ferritin of 3 and % sat of 3%.   Past Medical History  Diagnosis Date  . Knee pain   . Cataract     Past Surgical History  Procedure Laterality Date  . Eye surgery      Prior to Admission medications   Medication Sig Start Date End Date Taking? Authorizing Provider  Aspirin-Acetaminophen-Caffeine (GOODY HEADACHE PO) Take 2 Packages by mouth 2 (two) times daily as needed (pain).   Yes Historical Provider, MD  cephALEXin (KEFLEX) 500 MG capsule Take 1 capsule (500 mg total) by mouth 2 (two) times daily. Patient taking differently: Take 500 mg by mouth 2 (two) times daily. For 7 days 11-30 to 12-7 11/04/15  Yes Jessica C Copland, MD  Cyanocobalamin (VITAMIN B 12 PO) Take 1 tablet by mouth daily.    Yes Historical Provider, MD  glucosamine-chondroitin  500-400 MG tablet Take 1 tablet by mouth daily.   Yes Historical Provider, MD  ibuprofen (ADVIL,MOTRIN) 200 MG tablet Take 400 mg by mouth every 6 (six) hours as needed for moderate pain.   Yes Historical Provider, MD  meloxicam (MOBIC) 7.5 MG tablet Take 1 tablet (7.5 mg total) by mouth daily. Use as needed for pain 11/04/15  Yes Gay Filler Copland, MD  Multiple Vitamins-Minerals (HAIR/SKIN/NAILS PO) Take 1 tablet by mouth 3 (three) times daily.   Yes Historical Provider, MD  traMADol (ULTRAM) 50 MG tablet Take 1 tablet (50 mg total) by mouth every 8 (eight) hours as needed. Use for more severe knee pain Patient taking differently: Take 50 mg by mouth every 8 (eight) hours as needed for moderate pain.  11/04/15   Darreld Mclean, MD    Current Facility-Administered Medications  Medication Dose Route Frequency Provider Last Rate Last Dose  . 0.9 %  sodium chloride infusion   Intravenous Continuous Reubin Milan, MD 25 mL/hr at 11/05/15 1104    . cefTRIAXone (ROCEPHIN) 1 g in dextrose 5 % 50 mL IVPB  1 g Intravenous Q24H Eugenie Filler, MD   1 g at 11/05/15 1108  . ondansetron (ZOFRAN) tablet 4 mg  4 mg Oral Q6H PRN Reubin Milan, MD       Or  . ondansetron [facility]) injection 4 mg  4 mg Intravenous Q6H  PRN Reubin Milan, MD      . pantoprazole (PROTONIX) injection 40 mg  40 mg Intravenous Q12H Eugenie Filler, MD   40 mg at 11/05/15 1112  . peg 3350 powder (MOVIPREP) kit 100 g  0.5 kit Oral Once Eugenie Filler, MD       And  . Derrill Memo ON 11/06/2015] peg 3350 powder (MOVIPREP) kit 100 g  0.5 kit Oral Once Eugenie Filler, MD      . traMADol Veatrice Bourbon) tablet 50 mg  50 mg Oral Q8H PRN Reubin Milan, MD        Allergies as of 11/04/2015 - Review Complete 11/04/2015  Allergen Reaction Noted  . Sulfa antibiotics Hives 06/09/2012    Family History  Problem Relation Age of Onset  . Heart disease Father   . Heart disease Brother     Social History   Social History   . Marital Status: Married    Spouse Name: N/A  . Number of Children: N/A  . Years of Education: N/A   Occupational History  . Not on file.   Social History Main Topics  . Smoking status: Never Smoker   . Smokeless tobacco: Not on file  . Alcohol Use: No  . Drug Use: Not on file  . Sexual Activity: Not on file   Other Topics Concern  . Not on file   Social History Narrative    Review of Systems: Ten point ROS is O/W negative except as mentioned in HPI.  Physical Exam: Vital signs in last 24 hours: Temp:  [97.7 F (36.5 C)-98.7 F (37.1 C)] 98.5 F (36.9 C) (12/01 0918) Pulse Rate:  [62-85] 64 (12/01 0918) Resp:  [14-20] 16 (12/01 0918) BP: (105-162)/(45-64) 126/51 mmHg (12/01 0918) SpO2:  [100 %] 100 % (12/01 0918) Weight:  [173 lb (78.472 kg)] 173 lb (78.472 kg) (12/01 0120) Last BM Date: 11/04/15 General:  Alert, Well-developed, well-nourished, pleasant and cooperative in NAD Head:  Normocephalic and atraumatic. Eyes:  Sclera clear, no icterus.  Conjunctiva pink. Ears:  Normal auditory acuity. Mouth:  No deformity or lesions.   Lungs:  Clear throughout to auscultation.  No wheezes, crackles, or rhonchi.  Heart:  Regular rate and rhythm; no murmurs, clicks, rubs, or gallops. Abdomen:  Soft, non-distended.  BS present.  Non-tender.   Rectal:  Deferred.  Heme negative in ED.  Msk:  Symmetrical without gross deformities. Pulses:  Normal pulses noted. Extremities:  Without clubbing or edema. Neurologic:  Alert and  oriented x4;  grossly normal neurologically. Skin:  Intact without significant lesions or rashes. Psych:  Alert and cooperative. Normal mood and affect.  Intake/Output from previous day: 11/30 0701 - 12/01 0700 In: 538.8 [P.O.:240; Blood:298.8] Out: -  Intake/Output this shift: Total I/O In: 688 [P.O.:240; Blood:448] Out: -   Lab Results:  Recent Labs  11/04/15 1347 11/04/15 2320 11/05/15 1040  WBC 5.1 6.1 6.1  HGB 4.9* 5.1* 7.7*  HCT  17.9* 19.5* 26.9*  PLT 247 222 198   BMET  Recent Labs  11/04/15 1347 11/05/15 0026 11/05/15 1040  NA 137 139 140  K 4.2 4.0 4.0  CL 107 111 112*  CO2 19* 20* 22  GLUCOSE 87 101* 101*  BUN 16 21* 15  CREATININE 1.18* 1.12* 0.99  CALCIUM 8.7 8.8* 9.0   LFT  Recent Labs  11/05/15 1040  PROT 7.0  ALBUMIN 4.0  AST 15  ALT 11*  ALKPHOS 84  BILITOT 0.9   Studies/Results:  Dg Knee Complete 4 Views Left  11/04/2015  CLINICAL DATA:  Left knee pain and arthralgia.  No known injury. EXAM: LEFT KNEE - COMPLETE 4+ VIEW COMPARISON:  None. FINDINGS: There is no evidence of fracture, dislocation, or joint effusion. Severe tricompartmental osteoarthritis is seen. No other bone lesions identified. IMPRESSION: No acute findings. Severe tricompartmental osteoarthritis. Electronically Signed   By: Earle Gell M.D.   On: 11/04/2015 16:36   Dg Knee Complete 4 Views Right  11/04/2015  CLINICAL DATA:  Right knee pain. Right knee arthralgia. No known injury. EXAM: RIGHT KNEE - COMPLETE 4+ VIEW COMPARISON:  None. FINDINGS: There is no evidence of fracture, dislocation, or joint effusion. Severe tricompartmental osteoarthritis is seen. No other bone lesions identified. IMPRESSION: No acute findings. Severe tricompartmental osteoarthritis. Electronically Signed   By: Earle Gell M.D.   On: 11/04/2015 16:35   IMPRESSION/PLAN:  -Symptomatic IDA:  Hgb very low as well as iron studies.  Received 2 units PCBCs and is going to receive 2 more.  Will likely need iron supplements upon discharge and may want to consider iron infusion while she is here.  Uses NSAID's in the form of Goody powders and Ibuprofen regularly for quite some time.  She is heme negative with no signs of overt bleeding at any time according to patient's report.  Could do procedures as outpatient, however, there is question of compliance and adherence for follow-up by patient's daughter.  Will plan for inpatient EGD and colonoscopy at 8 AM on  12/2.  ZEHR, JESSICA D.  11/05/2015, 1:48 PM  Pager number [phone]     ________________________________________________________________________  Velora Heckler GI MD note:  I personally examined the patient, reviewed the data and agree with the assessment and plan described above.  81 yo woman with microcytic, IDA without overt GI bleeding. Takes frequent NSAIDs and has never had colon cancer screening. Hb 7.7 after 2 units yesterday. She will be getting more blood and will prep for colonoscopy/EGD tomorrow AM.   Owens Loffler, MD [facility] Gastroenterology Pager [phone]

## 2015-11-06 NOTE — Discharge Summary (Signed)
Physician Discharge Summary  Kathy Thornton [id number] DOB: 1934-05-16 DOA: 11/04/2015  PCP: Kathy Blinks, MD  Admit date: 11/04/2015 Discharge date: 11/06/2015  Time spent: 65 minutes  Recommendations for Outpatient Follow-up:  1. Follow-up with Thornton,JESSICA, MD in 1-2 weeks. On follow-up patient will benefit from a CBC done to follow-up on H&H. Patient has also been placed on oral iron supplementation.   Discharge Diagnoses:  Principal Problem:   Symptomatic anemia Active Problems:   Acute gastric ulcer   Anemia, iron deficiency   [name] lesion, acute: Per EGD 11/06/2015   Erosive gastritis: per EGD 11/05/2105   Arthritis of both knees   Heart murmur   UTI (lower urinary tract infection)   Lower extremity edema   Widened pulse pressure   Absolute anemia   Hemorrhoids; Grade 1 per colonoscopy 11/06/2015   Discharge Condition: Stable and improved  Diet recommendation: Regular  Filed Weights   11/05/15 0120 11/06/15 0723  Weight: 78.472 kg (173 lb) 78.472 kg (173 lb)    History of present illness:  Per Dr Kathy Thornton Kathy Thornton is a 81 y.o. female with a past medical history of osteoarthritis of the knees, cataracts who was referred by Kathy Thornton to come to the emergency department after routine labs came back with the patient having a hemoglobin level of 4.9 g/dL. The patient stated that she has been feeling more tired than usual over the past few months. She she noticed that about 8 months ago she started having postprandial abdominal pain, but this subsequently resolved on its own. She denied any recent symptoms, except for Friday after Thanksgiving when she had indigestion symptoms, followed by laxative induced diarrhea. This episode was also self-limited. She denied nausea, emesis, melena or hematochezia. She has occasional constipation.  She uses ibuprofen 200 mg, 2 tablets once a day, but not every day for knee pain. She uses on occasion Goody headaches  packs which contain aspirin. About 2 or 3 months ago, there was a period when the patient was using 2 packs once or twice a day. She last used 2 packs earlier on the day of admission, but had not used them in some time.   Patient also complained of nocturnal frequency and a urinalysis done on day of admission, at the office show positive nitrites and leukocyte esterase. She denied fever, chills, dysuria, hematuria or oliguria.    Hospital Course:  #1 symptomatic iron deficiency anemia Patient was admitted from PCPs office whereby labs that showed a hemoglobin of 4.9 with symptoms of fatigue and malaise ongoing for the past several months with some postprandial abdominal pain. Patient denied any melena or hematochezia or hematemesis. Patient was also noted to be on NSAIDs as needed for pain as well as Goody powders. Patient was seen in the ED, hemoglobin was noted to be 5.1. FOBT was negative. Patient was admitted to a MedSurg floor. Patient was typed and crossed and transfused a total of 4 units packed red blood cells. Anemia panel which was done showed an iron level of 14 with a ferritin of 3. Patient was also given some IV iron. Patient was also placed on PPI IV twice daily. GI was consulted. Patient was seen in consultation by Dr. Ardis Thornton. Patient subsequently underwent an upper endoscopy per Dr. Fuller Thornton which showed multiple [name] erosions in the cardia, single ulcer in the gastric antrum with biopsies taken, erosive gastritis in the gastric body and gastric antrum, 6 cm hiatal hernia. Colonoscopy which was done showed grade 1  internal hemorrhoids. Patient improved clinically posttransfusion. Patient will be discharged home in stable and improved condition on a PPI daily, patient is told to avoid NSAIDs. Pathology results will need to be followed up upon in the outpatient setting. Patient was discharged in stable and improved condition.  #2 urinary tract infection Urinalysis done at PCPs office was  consistent with a UTI. Urine cultures done at PCPs office preliminary result is greater than 100,000 gram-negative rods. Final cultures were pending. Patient was placed on IV Rocephin during the hospitalization with discharge back on oral Ceftin which was prescribed to her prior to admission. Outpatient follow-up.  #3 bilateral knee arthritis Patient was continued on tramadol as needed. Outpatient follow-up. Patient may benefit from orthopedic surgical evaluation as outpatient.  #4 heart murmur Patient was noted to have a heart murmur on admission which was felt likely to be secondary to problem #1. Patient remained chest pain-free. 2-D echo which was done had a EF of 60-65%, no wall motion abnormalities. Patient will follow-up with PCP as outpatient.  #5 gastric ulcer/Cameron erosions/erosive gastritis Per upper endoscopy 11/06/2015. See problem #1.  #6  Procedures:  4 units PRBC 11/05/2015  2-D echo 11/05/2015  Upper endoscopy per Dr. Fuller Thornton 11/06/2015  Colonoscopy per Dr. Fuller Thornton 11/06/2015  Plain films of bilateral knees 11/04/2015  Consultations:  Gastroenterology: Dr Kathy Thornton 11/06/2015  Discharge Exam: Filed Vitals:   11/06/15 0915 11/06/15 1019  BP: 147/59 150/60  Pulse: 59 63  Temp: 98.1 F (36.7 C) 97.6 F (36.4 C)  Resp: 18 18    General: NAD Cardiovascular: RRR Respiratory: CTAB  Discharge Instructions   Discharge Instructions    Diet general    Complete by:  As directed      Discharge instructions    Complete by:  As directed   Follow up with Thornton,JESSICA, MD in 1-2 weeks. NO NSAIDS, no GOODY'S POWDER.     Increase activity slowly    Complete by:  As directed           Current Discharge Medication List    START taking these medications   Details  iron polysaccharides (NIFEREX) 150 MG capsule Take 1 capsule (150 mg total) by mouth daily. Qty: 30 capsule, Refills: 2    pantoprazole (PROTONIX) 40 MG tablet Take 1 tablet (40 mg total) by mouth  daily at 6 (six) AM. Qty: 30 tablet, Refills: 2      CONTINUE these medications which have NOT CHANGED   Details  cephALEXin (KEFLEX) 500 MG capsule Take 1 capsule (500 mg total) by mouth 2 (two) times daily. Qty: 14 capsule, Refills: 0   Associated Diagnoses: Urinary urgency    Cyanocobalamin (VITAMIN B 12 PO) Take 1 tablet by mouth daily.     glucosamine-chondroitin 500-400 MG tablet Take 1 tablet by mouth daily.    Multiple Vitamins-Minerals (HAIR/SKIN/NAILS PO) Take 1 tablet by mouth 3 (three) times daily.    traMADol (ULTRAM) 50 MG tablet Take 1 tablet (50 mg total) by mouth every 8 (eight) hours as needed. Use for more severe knee pain Qty: 30 tablet, Refills: 0   Associated Diagnoses: Arthralgia of both knees      STOP taking these medications     Aspirin-Acetaminophen-Caffeine (GOODY HEADACHE PO)      ibuprofen (ADVIL,MOTRIN) 200 MG tablet      meloxicam (MOBIC) 7.5 MG tablet        Allergies  Allergen Reactions  . Sulfa Antibiotics Hives   Follow-up Information    Follow  up with Thornton,JESSICA, MD. Schedule an appointment as soon as possible for a visit in 2 weeks.   Specialty:  Family Medicine   Why:  f/u in 1-2 weeks   Contact information:   [place] [id number] [phone]        The results of significant diagnostics from this hospitalization (including imaging, microbiology, ancillary and laboratory) are listed below for reference.    Significant Diagnostic Studies: Dg Knee Complete 4 Views Left  11/04/2015  CLINICAL DATA:  Left knee pain and arthralgia.  No known injury. EXAM: LEFT KNEE - COMPLETE 4+ VIEW COMPARISON:  None. FINDINGS: There is no evidence of fracture, dislocation, or joint effusion. Severe tricompartmental osteoarthritis is seen. No other bone lesions identified. IMPRESSION: No acute findings. Severe tricompartmental osteoarthritis. Electronically Signed   By: Earle Gell M.D.   On: 11/04/2015 16:36   Dg Knee  Complete 4 Views Right  11/04/2015  CLINICAL DATA:  Right knee pain. Right knee arthralgia. No known injury. EXAM: RIGHT KNEE - COMPLETE 4+ VIEW COMPARISON:  None. FINDINGS: There is no evidence of fracture, dislocation, or joint effusion. Severe tricompartmental osteoarthritis is seen. No other bone lesions identified. IMPRESSION: No acute findings. Severe tricompartmental osteoarthritis. Electronically Signed   By: Earle Gell M.D.   On: 11/04/2015 16:35    Microbiology: Recent Results (from the past 240 hour(s))  Urine culture     Status: None (Preliminary result)   Collection Time: 11/04/15  1:47 PM  Result Value Ref Range Status   Colony Count >=100,000 COLONIES/ML  Preliminary   Preliminary Report Gram Negative Rods  Preliminary  Culture, Urine     Status: None   Collection Time: 11/05/15 11:09 AM  Result Value Ref Range Status   Specimen Description URINE, CLEAN CATCH  Final   Special Requests NONE  Final   Culture   Final    NO GROWTH 1 DAY Performed at [facility]    Report Status 11/06/2015 FINAL  Final     Labs: Basic Metabolic Panel:  Recent Labs Lab 11/04/15 1347 11/05/15 0026 11/05/15 1040 11/06/15 0150  NA 137 139 140 142  K 4.2 4.0 4.0 3.8  CL 107 111 112* 113*  CO2 19* 20* 22 20*  GLUCOSE 87 101* 101* 93  BUN 16 21* 15 13  CREATININE 1.18* 1.12* 0.99 1.03*  CALCIUM 8.7 8.8* 9.0 9.1  MG  --  2.2  --   --   PHOS  --  2.9  --   --    Liver Function Tests:  Recent Labs Lab 11/04/15 1347 11/05/15 0026 11/05/15 1040  AST 10 14* 15  ALT 8 10* 11*  ALKPHOS 80 77 84  BILITOT 0.3 0.5 0.9  PROT 6.7 6.8 7.0  ALBUMIN 3.9 3.8 4.0   No results for input(s): LIPASE, AMYLASE in the last 168 hours. No results for input(s): AMMONIA in the last 168 hours. CBC:  Recent Labs Lab 11/04/15 1347 11/04/15 2320 11/05/15 1040 11/06/15 0150  WBC 5.1 6.1 6.1 5.7  NEUTROABS  --  3.5 4.1  --   HGB 4.9* 5.1* 7.7* 10.3*  HCT 17.9* 19.5* 26.9* 33.8*  MCV  65.3* 69.1* 72.3* 76.1*  PLT 247 222 198 182   Cardiac Enzymes: No results for input(s): CKTOTAL, CKMB, CKMBINDEX, TROPONINI in the last 168 hours. BNP: BNP (last 3 results) No results for input(s): BNP in the last 8760 hours.  ProBNP (last 3 results) No results for input(s): PROBNP in  the last 8760 hours.  CBG: No results for input(s): GLUCAP in the last 168 hours.     SignedIrine Seal MD [facility] 11/06/2015, 2:30 PM

## 2015-11-06 NOTE — Transfer of Care (Signed)
Immediate Anesthesia Transfer of Care Note  Patient: Kathy Thornton  Procedure(s) Performed: Procedure(s): ESOPHAGOGASTRODUODENOSCOPY (EGD) WITH PROPOFOL (N/A) COLONOSCOPY WITH PROPOFOL (N/A)  Patient Location: PACU  Anesthesia Type:MAC  Level of Consciousness:  sedated, patient cooperative and responds to stimulation  Airway & Oxygen Therapy:Patient Spontanous Breathing and Patient connected to face mask oxgen  Post-op Assessment:  Report given to PACU RN and Post -op Vital signs reviewed and stable  Post vital signs:  Reviewed and stable  Last Vitals:  Filed Vitals:   11/06/15 0723 11/06/15 0842  BP: 173/48 97/32  Pulse: 65 61  Temp: 36.8 C 36.4 C  Resp: 15 18    Complications: No apparent anesthesia complications

## 2015-11-07 ENCOUNTER — Telehealth: Payer: Self-pay

## 2015-11-07 ENCOUNTER — Encounter: Payer: Self-pay | Admitting: Family Medicine

## 2015-11-07 LAB — URINE CULTURE: Colony Count: >=100000

## 2015-11-07 MED ORDER — AMOXICILLIN-POT CLAVULANATE 500-125 MG PO TABS: 1.0000 | ORAL_TABLET | Freq: Two times a day (BID) | ORAL | Status: DC

## 2015-11-07 NOTE — Addendum Note (Signed)
Addended by: Lamar Blinks C on: 11/07/2015 01:23 PM   Modules accepted: Orders, Medications

## 2015-11-07 NOTE — Telephone Encounter (Signed)
Patient just got out of the hospital and wants to ask Dr. Lorelei Pont which medications is she supposed to take.

## 2015-11-09 ENCOUNTER — Encounter: Payer: Self-pay | Admitting: Gastroenterology

## 2015-11-09 ENCOUNTER — Encounter (HOSPITAL_COMMUNITY): Payer: Self-pay | Admitting: Gastroenterology

## 2015-11-09 NOTE — Telephone Encounter (Signed)
Pt has to come in for hospital follow up. Left message for pt to call back.

## 2015-11-11 ENCOUNTER — Ambulatory Visit: Payer: Self-pay | Admitting: Family Medicine

## 2015-11-11 ENCOUNTER — Telehealth: Payer: Self-pay

## 2015-11-11 NOTE — Telephone Encounter (Signed)
Please give her a call back- I do not have appts on Monday the 12th but am glad to see her in the walk in.  Just ask for me and I will prioritize her.  Thanks!

## 2015-11-11 NOTE — Telephone Encounter (Addendum)
Pt states she had just gotten out of the hospital and was told to f/u with Dr Lorelei Pont and someone told the hospital to have her come in on the 12th, which is Monday and Dr Lorelei Pont doesn't have appt that day but didn't know if we can make one for her. Please call [phone]

## 2015-11-12 NOTE — Telephone Encounter (Signed)
Spoke with pt. And told her Dr. Lorelei Pont will be at the [facility] [address] on that day and she can just walk in and request to see her.

## 2015-11-13 NOTE — Telephone Encounter (Signed)
Pt advised.

## 2015-11-16 ENCOUNTER — Telehealth: Payer: Self-pay

## 2015-11-16 ENCOUNTER — Ambulatory Visit (INDEPENDENT_AMBULATORY_CARE_PROVIDER_SITE_OTHER): Payer: Medicare Other | Admitting: Family Medicine

## 2015-11-16 VITALS — BP 132/80 | HR 77 | Temp 98.0°F | Resp 17 | Ht 60.0 in | Wt 175.0 lb

## 2015-11-16 DIAGNOSIS — M25561 Pain in right knee: Secondary | ICD-10-CM | POA: Diagnosis not present

## 2015-11-16 DIAGNOSIS — M25562 Pain in left knee: Secondary | ICD-10-CM | POA: Diagnosis not present

## 2015-11-16 DIAGNOSIS — D500 Iron deficiency anemia secondary to blood loss (chronic): Secondary | ICD-10-CM | POA: Diagnosis not present

## 2015-11-16 LAB — POCT CBC
Granulocyte percent: 63.7 %G (ref 37–80)
HEMATOCRIT: 33 % — AB (ref 37.7–47.9)
Hemoglobin: 10.7 g/dL — AB (ref 12.2–16.2)
LYMPH, POC: 2.1 (ref 0.6–3.4)
MCH, POC: 24.2 pg — AB (ref 27–31.2)
MCHC: 32.3 g/dL (ref 31.8–35.4)
MCV: 75 fL — AB (ref 80–97)
MID (cbc): 0.4 (ref 0–0.9)
MPV: 7.6 fL (ref 0–99.8)
POC GRANULOCYTE: 4.5 (ref 2–6.9)
POC LYMPH %: 30 % (ref 10–50)
POC MID %: 6.3 % (ref 0–12)
Platelet Count, POC: 208 10*3/uL (ref 142–424)
RBC: 4.4 M/uL (ref 4.04–5.48)
RDW, POC: 27.2 %
WBC: 7 10*3/uL (ref 4.6–10.2)

## 2015-11-16 MED ORDER — TRAMADOL HCL 50 MG PO TABS: 50.0000 mg | ORAL_TABLET | Freq: Three times a day (TID) | ORAL | Status: DC | PRN

## 2015-11-16 NOTE — Patient Instructions (Addendum)
Give [facility] a call back to schedule an appt:  Address: [address], [place], [place] [address] Hours: Open today  8:30AM-5PM Phone: [phone]  Your hemoglobin is getting better!  Please come by for a lab visit only in about 1 month to recheck- I will order this for you For the time being use the tramadol and/ or tylenol as needed for your knees Remember to NOT take mobic, ibuprofen, aleve, or goody powder, or any other "NSAID" medication as they may irritate your ulcer.    As far as the ulcer, continue the protonix for a total of 8 weeks. I will ask Dr. Ardis Hughs if anything else is needed in this regard.

## 2015-11-16 NOTE — Progress Notes (Addendum)
[facility] [address], [place] [address] [phone]  Date:  11/16/2015   Name:  Kathy Thornton   DOB:  03-13-1934   MRN:  [id number]  PCP:  Kathy Blinks, MD    Chief Complaint: Follow-up   History of Present Illness:  Kathy Thornton is a 81 y.o. very pleasant female patient who presents with the following:  Here today for a hospital follow-up visit.  She was admitted 11/30 to 12/2 after routine labs showed severe anemia. She received 4 units of blood and was started on a PPI IV.  GI consulted and she underwent endoscopy and colonoscopy- showed a gastric ulcer, colonoscopy was negative except for hemorrhoiuds.  She was discharged home and asked to avoid NSAIDs, on PPI daily.  She also had a UTI and was treated with keflex upon discharge.  This was changed to augmentin when her cultrue came back,  She had a heart murmur while inpt but echo was normal.  throught due to anemia.   Patient Active Problem List   Diagnosis Date Noted  . [name] lesion, acute: Per EGD 11/06/2015 11/06/2015  . Erosive gastritis: per EGD 11/05/2105 11/06/2015  . Hemorrhoids; Grade 1 per colonoscopy 11/06/2015 11/06/2015  . Anemia, iron deficiency   . Acute gastric ulcer   . Anemia 11/05/2015  . Symptomatic anemia 11/05/2015  . Heart murmur 11/05/2015  . UTI (lower urinary tract infection) 11/05/2015  . Lower extremity edema 11/05/2015  . Widened pulse pressure 11/05/2015  . Absolute anemia   . Arthritis of both knees 11/04/2015    Past Medical History  Diagnosis Date  . Knee pain   . Cataract   . [name] lesion, acute: Per EGD 11/06/2015 11/06/2015  . Erosive gastritis: per EGD 11/05/2105 11/06/2015  . Hemorrhoids; Grade 1 per colonoscopy 11/06/2015 11/06/2015    Past Surgical History  Procedure Laterality Date  . Eye surgery    . Esophagogastroduodenoscopy (egd) with propofol N/A 11/06/2015    Procedure: ESOPHAGOGASTRODUODENOSCOPY (EGD) WITH PROPOFOL;  Surgeon: Ladene Artist, MD;  Location: [facility];  Service: Endoscopy;  Laterality: N/A;  . Colonoscopy with propofol N/A 11/06/2015    Procedure: COLONOSCOPY WITH PROPOFOL;  Surgeon: Ladene Artist, MD;  Location: [facility];  Service: Endoscopy;  Laterality: N/A;    Social History  Substance Use Topics  . Smoking status: Never Smoker   . Smokeless tobacco: None  . Alcohol Use: No    Family History  Problem Relation Age of Onset  . Heart disease Father   . Heart disease Brother     Allergies  Allergen Reactions  . Sulfa Antibiotics Hives    Medication list has been reviewed and updated.  Current Outpatient Prescriptions on File Prior to Visit  Medication Sig Dispense Refill  . Cyanocobalamin (VITAMIN B 12 PO) Take 1 tablet by mouth daily.     . iron polysaccharides (NIFEREX) 150 MG capsule Take 1 capsule (150 mg total) by mouth daily. 30 capsule 2  . Multiple Vitamins-Minerals (HAIR/SKIN/NAILS PO) Take 1 tablet by mouth 3 (three) times daily.    . pantoprazole (PROTONIX) 40 MG tablet Take 1 tablet (40 mg total) by mouth daily at 6 (six) AM. 30 tablet 2  . traMADol (ULTRAM) 50 MG tablet Take 1 tablet (50 mg total) by mouth every 8 (eight) hours as needed. Use for more severe knee pain (Patient taking differently: Take 50 mg by mouth every 8 (eight) hours as needed for moderate pain. )  30 tablet 0  . glucosamine-chondroitin 500-400 MG tablet Take 1 tablet by mouth daily.     No current facility-administered medications on file prior to visit.    Review of Systems:  As per HPI- otherwise negative.   Physical Examination: Filed Vitals:   11/16/15 1119  BP: 132/80  Pulse: 77  Temp: 98 F (36.7 C)  Resp: 17   Filed Vitals:   11/16/15 1119  Height: 5' (1.524 m)  Weight: 175 lb (79.379 kg)   Body mass index is 34.18 kg/(m^2). Ideal Body Weight: Weight in (lb) to have BMI = 25: 127.7  GEN: WDWN, NAD, Non-toxic, A & O x 3 HEENT: Atraumatic, Normocephalic. Neck supple. No  masses, No LAD. Ears and Nose: No external deformity. CV: RRR, No M/G/R. No JVD. No thrill. No extra heart sounds. PULM: CTA B, no wheezes, crackles, rhonchi. No retractions. No resp. distress. No accessory muscle use. ABD: S, NT, ND, +BS. No rebound. No HSM. EXTR: No c/c/e NEURO Normal gait.  PSYCH: Normally interactive. Conversant. Not depressed or anxious appearing.  Calm demeanor.   Wt Readings from Last 3 Encounters:  11/16/15 175 lb (79.379 kg)  11/06/15 173 lb (78.472 kg)  11/04/15 173 lb (78.472 kg)   Results for orders placed or performed in visit on 11/16/15  POCT CBC  Result Value Ref Range   WBC 7.0 4.6 - 10.2 K/uL   Lymph, poc 2.1 0.6 - 3.4   POC LYMPH PERCENT 30.0 10 - 50 %L   MID (cbc) 0.4 0 - 0.9   POC MID % 6.3 0 - 12 %M   POC Granulocyte 4.5 2 - 6.9   Granulocyte percent 63.7 37 - 80 %G   RBC 4.40 4.04 - 5.48 M/uL   Hemoglobin 10.7 (A) 12.2 - 16.2 g/dL   HCT, POC 33.0 (A) 37.7 - 47.9 %   MCV 75.0 (A) 80 - 97 fL   MCH, POC 24.2 (A) 27 - 31.2 pg   MCHC 32.3 31.8 - 35.4 g/dL   RDW, POC 27.2 %   Platelet Count, POC 208 142 - 424 K/uL   MPV 7.6 0 - 99.8 fL    Assessment and Plan: Iron deficiency anemia due to chronic blood loss - Plan: CBC  Arthralgia of both knees - Plan: traMADol (ULTRAM) 50 MG tablet, POCT CBC  Her hemoglobin is trending up Refilled her tramadol -she does not need this yet but she cannot take any NSAIDs for her end stage knee arthritis so she will keep this on hand See patient instructions for more details.   Plan repeat CBC in one month Send staff message to her GI doc concerning follow-up palns   Signed Kathy Blinks, MD  Received note back from Dr. Ardis Thornton- he would like pt to stay on daily protonix after she finishes 8 weeks of BID and his office will contact the pt regarding follow-up.  Called pt and relayed following info  Kathy Thornton,  Thanks for the note. I think she should stay on PPI once daily after the 2 months of BID  (because of the cameron's erosions in hiatal hernia). Avoid NSAIDs. Iron supplement once daily. I'll have my office get in touch with her for follow up in 2-3 months as well. Thanks

## 2015-11-16 NOTE — Telephone Encounter (Signed)
-----   Message from Milus Banister, MD sent at 11/16/2015 12:18 PM EST ----- Keane Scrape, Thanks for the note.  I think she should stay on PPI once daily after the 2 months of BID (because of the cameron's erosions in hiatal hernia).  Avoid NSAIDs.  Iron supplement once daily.  I'll have my office get in touch with her for follow up in 2-3 months as well.  Thanks  Bear Stearns, She needs rov in 2-3 months.  Thanks    ----- Message -----    From: Darreld Mclean, MD    Sent: 11/16/2015  12:04 PM      To: Milus Banister, MD  Salley Scarlet Linna Hoff- you may remember Kathy Thornton from her hospital stay for anemia/ NSAID ulcer.  I saw her back today and she is feeling great, her Hg is going up and is at 10.7 currently.  I have instructed her to continue BID protonix for 8 weeks and then stop.  They were not aware of any follow-up visit with you.  Let me know if there is you want me to have her continue the protonix longer or if she needs to see your office in follow-up.    Warm regards, Jess Copland

## 2015-11-17 NOTE — Telephone Encounter (Signed)
Reminder in system to call pt and set up f/u in 2-3 months.

## 2015-11-30 ENCOUNTER — Telehealth: Payer: Self-pay

## 2015-11-30 MED ORDER — PANTOPRAZOLE SODIUM 40 MG PO TBEC: 40.0000 mg | DELAYED_RELEASE_TABLET | Freq: Two times a day (BID) | ORAL | Status: DC

## 2015-11-30 NOTE — Telephone Encounter (Signed)
Rx sent in

## 2015-11-30 NOTE — Telephone Encounter (Signed)
Received note back from Dr. Ardis Hughs- he would like pt to stay on daily protonix after she finishes 8 weeks of BID and his office will contact the pt regarding follow-up. Called pt and relayed following info  Jess,  Thanks for the note. I think she should stay on PPI once daily after the 2 months of BID (because of the cameron's erosions in hiatal hernia). Avoid NSAIDs. Iron supplement once daily. I'll have my office get in touch with her for follow up in 2-3 months as well. Thanks

## 2015-11-30 NOTE — Telephone Encounter (Signed)
Based on the notes at the pt's visit 11/16/15 w/ Dr. Lorelei Pont it looks like the pt was told to take two Protonix pills a day for two months. However,the current Rx that she has still says to take one pill a day. Pt will run out of the Protonix soon and will need a refill with the instructions to say that she needs to take two of these pills a day.

## 2015-12-01 NOTE — Telephone Encounter (Signed)
Advised daughter 

## 2015-12-17 ENCOUNTER — Other Ambulatory Visit (INDEPENDENT_AMBULATORY_CARE_PROVIDER_SITE_OTHER): Payer: Medicare Other

## 2015-12-17 ENCOUNTER — Other Ambulatory Visit: Payer: Self-pay | Admitting: Family Medicine

## 2015-12-17 ENCOUNTER — Telehealth: Payer: Self-pay | Admitting: Family Medicine

## 2015-12-17 DIAGNOSIS — M25562 Pain in left knee: Principal | ICD-10-CM

## 2015-12-17 DIAGNOSIS — M25561 Pain in right knee: Secondary | ICD-10-CM

## 2015-12-17 DIAGNOSIS — D500 Iron deficiency anemia secondary to blood loss (chronic): Secondary | ICD-10-CM | POA: Diagnosis not present

## 2015-12-17 DIAGNOSIS — D509 Iron deficiency anemia, unspecified: Secondary | ICD-10-CM

## 2015-12-17 LAB — CBC
HEMATOCRIT: 35.6 % — AB (ref 36.0–46.0)
Hemoglobin: 11.5 g/dL — ABNORMAL LOW (ref 12.0–15.0)
MCH: 27.1 pg (ref 26.0–34.0)
MCHC: 32.3 g/dL (ref 30.0–36.0)
MCV: 83.8 fL (ref 78.0–100.0)
MPV: 9 fL (ref 8.6–12.4)
Platelets: 189 10*3/uL (ref 150–400)
RBC: 4.25 MIL/uL (ref 3.87–5.11)
RDW: 27.5 % — ABNORMAL HIGH (ref 11.5–15.5)
WBC: 5.9 10*3/uL (ref 4.0–10.5)

## 2015-12-17 MED ORDER — TRAMADOL HCL 50 MG PO TABS: 50.0000 mg | ORAL_TABLET | Freq: Three times a day (TID) | ORAL | Status: DC | PRN

## 2015-12-17 NOTE — Telephone Encounter (Signed)
Patient request a refill for Tramadol 50 mg. Please call patient at [phone] or [phone].

## 2015-12-17 NOTE — Progress Notes (Signed)
CBC continues to improve,  Called to let her know. She is taking iron still, and is feeling well overall.   She will come by for a CBC in another month. She has about 15 days of her iron left and will take it until gone

## 2015-12-25 ENCOUNTER — Encounter: Payer: Self-pay | Admitting: Family Medicine

## 2015-12-30 ENCOUNTER — Encounter: Payer: Self-pay | Admitting: Family Medicine

## 2016-01-07 ENCOUNTER — Telehealth: Payer: Self-pay

## 2016-01-07 NOTE — Telephone Encounter (Signed)
Pt was told by dr copland that when she was ready for a refill on protonix to call her first that she may be able to stop the medication and that is what she is needing to know to stop meds or refill it  Best number [phone]

## 2016-01-08 ENCOUNTER — Telehealth: Payer: Self-pay

## 2016-01-08 MED ORDER — PANTOPRAZOLE SODIUM 40 MG PO TBEC: 40.0000 mg | DELAYED_RELEASE_TABLET | Freq: Every day | ORAL | Status: DC

## 2016-01-08 NOTE — Telephone Encounter (Signed)
Patient is due for a refill not sure to have it refilled.  On protonix.   Please call with answer [phone]

## 2016-01-08 NOTE — Telephone Encounter (Signed)
According to OV notes from last visit in Dec, Dr Lorelei Pont wrote this: "I think she should stay on PPI once daily after the 2 months of BID (because of the cameron's erosions in hiatal hernia). Avoid NSAIDs". I will send in a new Rx for QD dosing per instr's. Called daughter and explained. She agreed and will also bring pt back in for check up before Dr Lorelei Pont leaves, week of 2/13.

## 2016-01-08 NOTE — Telephone Encounter (Signed)
Pt's daughter called due to insurance denying coverage/// Prior auth needed to exceed qty. limit   Original Order:  pantoprazole (PROTONIX) 40 MG tablet [id number]         Pharmacy:  [id number] - [place], [place]        [phone] or [phone] VM ok on both (daughter)

## 2016-01-10 NOTE — Telephone Encounter (Signed)
Daughter is waiting for the feedback on patients prescription.  [phone]

## 2016-01-11 NOTE — Telephone Encounter (Signed)
Called her daughter back- she only had to be on the protonix BID for 8 weeks following her hospital stay.  She is now on once a day.  I think that this is resolved but it not they will get a box of OTC prilosec to tide her over until insurance will cover the protonix on the 10th

## 2016-01-19 ENCOUNTER — Ambulatory Visit (INDEPENDENT_AMBULATORY_CARE_PROVIDER_SITE_OTHER): Payer: Medicare Other | Admitting: Family Medicine

## 2016-01-19 VITALS — BP 148/80 | HR 86 | Temp 98.0°F | Resp 16 | Ht 60.0 in | Wt 165.0 lb

## 2016-01-19 DIAGNOSIS — K253 Acute gastric ulcer without hemorrhage or perforation: Secondary | ICD-10-CM | POA: Diagnosis not present

## 2016-01-19 DIAGNOSIS — D509 Iron deficiency anemia, unspecified: Secondary | ICD-10-CM | POA: Diagnosis not present

## 2016-01-19 LAB — CBC
HCT: 35.4 % — ABNORMAL LOW (ref 36.0–46.0)
HEMOGLOBIN: 11.6 g/dL — AB (ref 12.0–15.0)
MCH: 29.2 pg (ref 26.0–34.0)
MCHC: 32.8 g/dL (ref 30.0–36.0)
MCV: 89.2 fL (ref 78.0–100.0)
MPV: 8.8 fL (ref 8.6–12.4)
Platelets: 214 10*3/uL (ref 150–400)
RBC: 3.97 MIL/uL (ref 3.87–5.11)
RDW: 19.4 % — ABNORMAL HIGH (ref 11.5–15.5)
WBC: 10 10*3/uL (ref 4.0–10.5)

## 2016-01-19 LAB — COMPREHENSIVE METABOLIC PANEL
ALBUMIN: 3.7 g/dL (ref 3.6–5.1)
ALT: 8 U/L (ref 6–29)
AST: 12 U/L (ref 10–35)
Alkaline Phosphatase: 89 U/L (ref 33–130)
BILIRUBIN TOTAL: 0.4 mg/dL (ref 0.2–1.2)
BUN: 10 mg/dL (ref 7–25)
CO2: 24 mmol/L (ref 20–31)
CREATININE: 1.11 mg/dL — AB (ref 0.60–0.93)
Calcium: 9.3 mg/dL (ref 8.6–10.4)
Chloride: 109 mmol/L (ref 98–110)
Glucose, Bld: 97 mg/dL (ref 65–99)
Potassium: 4.2 mmol/L (ref 3.5–5.3)
SODIUM: 140 mmol/L (ref 135–146)
TOTAL PROTEIN: 6.7 g/dL (ref 6.1–8.1)

## 2016-01-19 NOTE — Progress Notes (Addendum)
[facility] [address] [place] [address] [phone]  Date:  01/19/2016   Name:  Kathy Thornton   DOB:  [birth year]   MRN:  [id number]  PCP:  Lamar Blinks, MD    Chief Complaint: Follow-up   History of Present Illness:  Kathy Thornton is a 82 y.o. very pleasant female patient who presents with the following:  Here today to recheck anemia that was due to a gastric ulcer s/p hospital stay from 11/30 to 11/06/2015.  At her last visit 2 months ago her hg was recovering.  Nadir of 4.9, she was transfused and had a hg of 11.5 at last check a month ago  She has been feeling better overall- she did have a GI bug of some sort over the last week.  She has noted diarrhea but not vomiting.  Decreased appetite but able to take po.  She feels like she is recovering from this today.  She always has dark stools since she has been on oral iron but no change in her stools and no red blood noted.   She is taking protonix once a day for now- completed 8 weeks of BID therapy  She is not aware of a follow-up visit with GI.   Chart shows that she has an appt inApril  Patient Active Problem List   Diagnosis Date Noted  . [name] lesion, acute: Per EGD 11/06/2015 11/06/2015  . Erosive gastritis: per EGD 11/05/2105 11/06/2015  . Hemorrhoids; Grade 1 per colonoscopy 11/06/2015 11/06/2015  . Anemia, iron deficiency   . Acute gastric ulcer   . Anemia 11/05/2015  . Symptomatic anemia 11/05/2015  . Heart murmur 11/05/2015  . UTI (lower urinary tract infection) 11/05/2015  . Lower extremity edema 11/05/2015  . Widened pulse pressure 11/05/2015  . Absolute anemia   . Arthritis of both knees 11/04/2015    Past Medical History  Diagnosis Date  . Knee pain   . Cataract   . [name] lesion, acute: Per EGD 11/06/2015 11/06/2015  . Erosive gastritis: per EGD 11/05/2105 11/06/2015  . Hemorrhoids; Grade 1 per colonoscopy 11/06/2015 11/06/2015    Past Surgical History  Procedure  Laterality Date  . Eye surgery    . Esophagogastroduodenoscopy (egd) with propofol N/A 11/06/2015    Procedure: ESOPHAGOGASTRODUODENOSCOPY (EGD) WITH PROPOFOL;  Surgeon: Ladene Artist, MD;  Location: [facility];  Service: Endoscopy;  Laterality: N/A;  . Colonoscopy with propofol N/A 11/06/2015    Procedure: COLONOSCOPY WITH PROPOFOL;  Surgeon: Ladene Artist, MD;  Location: [facility];  Service: Endoscopy;  Laterality: N/A;    Social History  Substance Use Topics  . Smoking status: Never Smoker   . Smokeless tobacco: None  . Alcohol Use: No    Family History  Problem Relation Age of Onset  . Heart disease Father   . Heart disease Brother     Allergies  Allergen Reactions  . Sulfa Antibiotics Hives    Medication list has been reviewed and updated.  Current Outpatient Prescriptions on File Prior to Visit  Medication Sig Dispense Refill  . glucosamine-chondroitin 500-400 MG tablet Take 1 tablet by mouth daily.    . iron polysaccharides (NIFEREX) 150 MG capsule Take 1 capsule (150 mg total) by mouth daily. 30 capsule 2  . Multiple Vitamins-Minerals (HAIR/SKIN/NAILS PO) Take 1 tablet by mouth 3 (three) times daily.    . pantoprazole (PROTONIX) 40 MG tablet Take 1 tablet (40 mg total) by mouth daily. 90 tablet 1  .  traMADol (ULTRAM) 50 MG tablet Take 1 tablet (50 mg total) by mouth every 8 (eight) hours as needed for moderate pain. 30 tablet 1  . Cyanocobalamin (VITAMIN B 12 PO) Take 1 tablet by mouth daily. Reported on 01/19/2016     No current facility-administered medications on file prior to visit.    Review of Systems:  As per HPI- otherwise negative.   Physical Examination: Filed Vitals:   01/19/16 1017  BP: 169/84  Pulse: 86  Temp: 98 F (36.7 C)  Resp: 16   Filed Vitals:   01/19/16 1017  Height: 5' (1.524 m)  Weight: 165 lb (74.844 kg)   Body mass index is 32.22 kg/(m^2). Ideal Body Weight: Weight in (lb) to have BMI = 25: 127.7  GEN: WDWN, NAD,  Non-toxic, A & O x 3, overweight, looks well HEENT: Atraumatic, Normocephalic. Neck supple. No masses, No LAD.  Bilateral TM wnl, oropharynx normal.  PEERL,EOMI.   Ears and Nose: No external deformity. CV: RRR, No M/G/R. No JVD. No thrill. No extra heart sounds. PULM: CTA B, no wheezes, crackles, rhonchi. No retractions. No resp. distress. No accessory muscle use. ABD: S, NT, ND, +BS. No rebound. No HSM.  Benign belly EXTR: No c/c/e NEURO Normal gait.  PSYCH: Normally interactive. Conversant. Not depressed or anxious appearing.  Calm demeanor.    Assessment and Plan: Acute gastric ulcer - Plan: CBC, Comprehensive metabolic panel  Anemia, iron deficiency - Plan: CBC, Comprehensive metabolic panel  Await her labs today and will be in touch with her.  She was recently ill with diarrhea but this seems to be resolving today.  She will let me know if her sx return   Signed Lamar Blinks, MD  Received her labs and gave her a call, LMOM- it now appears that her iron stores are back to normal as her MCV is normal again.  She can finish out any iron that she has on hand but does not have to refill.  Renal function is about the same.  Can plan to have repeat labs when she sees GI in April- OW no acute follow-up needed.   Continue once a day protonix  Results for orders placed or performed in visit on 01/19/16  CBC  Result Value Ref Range   WBC 10.0 4.0 - 10.5 K/uL   RBC 3.97 3.87 - 5.11 MIL/uL   Hemoglobin 11.6 (L) 12.0 - 15.0 g/dL   HCT 35.4 (L) 36.0 - 46.0 %   MCV 89.2 78.0 - 100.0 fL   MCH 29.2 26.0 - 34.0 pg   MCHC 32.8 30.0 - 36.0 g/dL   RDW 19.4 (H) 11.5 - 15.5 %   Platelets 214 150 - 400 K/uL   MPV 8.8 8.6 - 12.4 fL  Comprehensive metabolic panel  Result Value Ref Range   Sodium 140 135 - 146 mmol/L   Potassium 4.2 3.5 - 5.3 mmol/L   Chloride 109 98 - 110 mmol/L   CO2 24 20 - 31 mmol/L   Glucose, Bld 97 65 - 99 mg/dL   BUN 10 7 - 25 mg/dL   Creat 1.11 (H) 0.60 - 0.93 mg/dL    Total Bilirubin 0.4 0.2 - 1.2 mg/dL   Alkaline Phosphatase 89 33 - 130 U/L   AST 12 10 - 35 U/L   ALT 8 6 - 29 U/L   Total Protein 6.7 6.1 - 8.1 g/dL   Albumin 3.7 3.6 - 5.1 g/dL   Calcium 9.3 8.6 - 10.4 mg/dL

## 2016-01-19 NOTE — Patient Instructions (Addendum)
It would be my pleasure to continue to see you as a patient at my new office, starting the last week of February.  If you prefer to remain at [facility] one of my partners will be happy to see you here.   [facility] [address], [place], [place] [address] Phone: [phone]  I will be in touch with your labs from today and will be sure that you have a follow-up with the gastroenterologist Let me know if your diarrhea comes back!

## 2016-01-21 ENCOUNTER — Encounter: Payer: Self-pay | Admitting: Family Medicine

## 2016-01-25 DIAGNOSIS — M25562 Pain in left knee: Secondary | ICD-10-CM | POA: Diagnosis not present

## 2016-01-25 DIAGNOSIS — M545 Low back pain: Secondary | ICD-10-CM | POA: Diagnosis not present

## 2016-01-25 DIAGNOSIS — M25561 Pain in right knee: Secondary | ICD-10-CM | POA: Diagnosis not present

## 2016-01-25 DIAGNOSIS — M25551 Pain in right hip: Secondary | ICD-10-CM | POA: Diagnosis not present

## 2016-01-29 DIAGNOSIS — M545 Low back pain: Secondary | ICD-10-CM | POA: Diagnosis not present

## 2016-02-07 ENCOUNTER — Emergency Department (HOSPITAL_COMMUNITY): Payer: Medicare Other

## 2016-02-07 ENCOUNTER — Emergency Department (HOSPITAL_COMMUNITY)
Admission: EM | Admit: 2016-02-07 | Discharge: 2016-02-07 | Disposition: A | Discharge status: Home / Self Care | Payer: Medicare Other | Attending: Emergency Medicine | Admitting: Emergency Medicine

## 2016-02-07 ENCOUNTER — Encounter (HOSPITAL_COMMUNITY): Payer: Self-pay | Admitting: *Deleted

## 2016-02-07 DIAGNOSIS — Z8719 Personal history of other diseases of the digestive system: Secondary | ICD-10-CM | POA: Diagnosis not present

## 2016-02-07 DIAGNOSIS — R531 Weakness: Secondary | ICD-10-CM | POA: Diagnosis not present

## 2016-02-07 DIAGNOSIS — B349 Viral infection, unspecified: Secondary | ICD-10-CM | POA: Diagnosis not present

## 2016-02-07 DIAGNOSIS — Z79899 Other long term (current) drug therapy: Secondary | ICD-10-CM | POA: Insufficient documentation

## 2016-02-07 DIAGNOSIS — R059 Cough, unspecified: Secondary | ICD-10-CM

## 2016-02-07 DIAGNOSIS — R05 Cough: Secondary | ICD-10-CM

## 2016-02-07 DIAGNOSIS — Z8669 Personal history of other diseases of the nervous system and sense organs: Secondary | ICD-10-CM | POA: Insufficient documentation

## 2016-02-07 DIAGNOSIS — R404 Transient alteration of awareness: Secondary | ICD-10-CM | POA: Diagnosis not present

## 2016-02-07 DIAGNOSIS — R197 Diarrhea, unspecified: Secondary | ICD-10-CM | POA: Diagnosis not present

## 2016-02-07 LAB — CBC WITH DIFFERENTIAL/PLATELET
Basophils Absolute: 0 10*3/uL (ref 0.0–0.1)
Basophils Relative: 0 %
EOS PCT: 0 %
Eosinophils Absolute: 0 10*3/uL (ref 0.0–0.7)
HCT: 37.3 % (ref 36.0–46.0)
Hemoglobin: 12.3 g/dL (ref 12.0–15.0)
LYMPHS ABS: 1.6 10*3/uL (ref 0.7–4.0)
Lymphocytes Relative: 19 %
MCH: 29.8 pg (ref 26.0–34.0)
MCHC: 33 g/dL (ref 30.0–36.0)
MCV: 90.3 fL (ref 78.0–100.0)
MONO ABS: 0.4 10*3/uL (ref 0.1–1.0)
Monocytes Relative: 5 %
Neutro Abs: 6.2 10*3/uL (ref 1.7–7.7)
Neutrophils Relative %: 76 %
PLATELETS: 158 10*3/uL (ref 150–400)
RBC: 4.13 MIL/uL (ref 3.87–5.11)
RDW: 15.3 % (ref 11.5–15.5)
WBC: 8.2 10*3/uL (ref 4.0–10.5)

## 2016-02-07 LAB — COMPREHENSIVE METABOLIC PANEL
ALT: 11 U/L — ABNORMAL LOW (ref 14–54)
AST: 17 U/L (ref 15–41)
Albumin: 3.3 g/dL — ABNORMAL LOW (ref 3.5–5.0)
Alkaline Phosphatase: 67 U/L (ref 38–126)
Anion gap: 11 (ref 5–15)
BUN: 14 mg/dL (ref 6–20)
CHLORIDE: 109 mmol/L (ref 101–111)
CO2: 22 mmol/L (ref 22–32)
Calcium: 8.5 mg/dL — ABNORMAL LOW (ref 8.9–10.3)
Creatinine, Ser: 1.1 mg/dL — ABNORMAL HIGH (ref 0.44–1.00)
GFR, EST AFRICAN AMERICAN: 54 mL/min — AB (ref >60)
GFR, EST NON AFRICAN AMERICAN: 46 mL/min — AB (ref >60)
Glucose, Bld: 99 mg/dL (ref 65–99)
POTASSIUM: 3.6 mmol/L (ref 3.5–5.1)
Sodium: 142 mmol/L (ref 135–145)
Total Bilirubin: 0.3 mg/dL (ref 0.3–1.2)
Total Protein: 6.4 g/dL — ABNORMAL LOW (ref 6.5–8.1)

## 2016-02-07 LAB — INFLUENZA PANEL BY PCR (TYPE A & B)
H1N1FLUPCR: NOT DETECTED
INFLAPCR: POSITIVE — AB
INFLBPCR: NEGATIVE

## 2016-02-07 LAB — URINALYSIS, ROUTINE W REFLEX MICROSCOPIC
BILIRUBIN URINE: NEGATIVE
Glucose, UA: NEGATIVE mg/dL
HGB URINE DIPSTICK: NEGATIVE
KETONES UR: 15 mg/dL — AB
Leukocytes, UA: NEGATIVE
Nitrite: NEGATIVE
PROTEIN: NEGATIVE mg/dL
Specific Gravity, Urine: 1.016 (ref 1.005–1.030)
pH: 6 (ref 5.0–8.0)

## 2016-02-07 LAB — LIPASE, BLOOD: LIPASE: 26 U/L (ref 11–51)

## 2016-02-07 MED ORDER — DEXAMETHASONE SODIUM PHOSPHATE 10 MG/ML IJ SOLN
10.0000 mg | Freq: Once | INTRAMUSCULAR | Status: AC
  Administered 2016-02-07: 10 mg via INTRAVENOUS
  Filled 2016-02-07: qty 1

## 2016-02-07 MED ORDER — SODIUM CHLORIDE 0.9 % IV BOLUS (SEPSIS)
500.0000 mL | Freq: Once | INTRAVENOUS | Status: AC
Start: 2016-02-07 — End: 2016-02-07
  Administered 2016-02-07: 500 mL via INTRAVENOUS

## 2016-02-07 MED ORDER — ALBUTEROL SULFATE HFA 108 (90 BASE) MCG/ACT IN AERS: 1.0000 | INHALATION_SPRAY | Freq: Four times a day (QID) | RESPIRATORY_TRACT | Status: DC | PRN

## 2016-02-07 MED ORDER — ALBUTEROL SULFATE (2.5 MG/3ML) 0.083% IN NEBU
5.0000 mg | INHALATION_SOLUTION | Freq: Once | RESPIRATORY_TRACT | Status: AC
  Administered 2016-02-07: 5 mg via RESPIRATORY_TRACT
  Filled 2016-02-07: qty 6

## 2016-02-07 MED ORDER — SODIUM CHLORIDE 0.9 % IV BOLUS (SEPSIS)
1000.0000 mL | Freq: Once | INTRAVENOUS | Status: AC
  Administered 2016-02-07: 1000 mL via INTRAVENOUS

## 2016-02-07 MED ORDER — BENZONATATE 100 MG PO CAPS: 100.0000 mg | ORAL_CAPSULE | Freq: Three times a day (TID) | ORAL | Status: DC

## 2016-02-07 MED ORDER — IPRATROPIUM-ALBUTEROL 0.5-2.5 (3) MG/3ML IN SOLN
3.0000 mL | Freq: Once | RESPIRATORY_TRACT | Status: AC
  Administered 2016-02-07: 3 mL via RESPIRATORY_TRACT
  Filled 2016-02-07: qty 3

## 2016-02-07 MED ORDER — GUAIFENESIN ER 600 MG PO TB12: 600.0000 mg | ORAL_TABLET | Freq: Two times a day (BID) | ORAL | Status: DC | PRN

## 2016-02-07 MED ORDER — PREDNISONE 20 MG PO TABS: 40.0000 mg | ORAL_TABLET | Freq: Every day | ORAL | Status: DC

## 2016-02-07 NOTE — ED Notes (Signed)
Patient was alert, oriented and stable upon discharge. RN went over AVS and patient had no further questions.  

## 2016-02-07 NOTE — ED Provider Notes (Signed)
CSN: [id number]     Arrival date & time 02/07/16  1017 History   First MD Initiated Contact with Patient 02/07/16 1032     Chief Complaint  Patient presents with  . Weakness     (Consider location/radiation/quality/duration/timing/severity/associated sxs/prior Treatment) HPI   Kathy Thornton Is a 82 year old female brought in by her daughter and husband for cough and weakness. She has a past history of a recent GI bleed for which she was hospitalized in December 2016 receiving 4 units of blood. According to the family this is due to erosive ulcers. The chart states that this is due to [facility] lesions and erosive gastritis. The patient states that she has been feeling very poorly over the past 2 weeks. She has had a productive cough, wheezing. She denies a history of wheezing, asthma, COPD or heart failure. She does have a previous history of pneumonia and states that this feels similar. She has had very poor appetite and oral intake. She is currently taking iron supplementation and has been using Pepto-Bismol because she has had about 2 weeks of diarrhea including watery stools. She is unsure if this is melena versus effect of the medication she has been taking. She states that she feels extremely weak and is having difficulty walking. She has lightheadedness. She has a painful cough. She is unsure. She's been running fevers. She denies peripheral edema.  Past Medical History  Diagnosis Date  . Knee pain   . Cataract   . [name] lesion, acute: Per EGD 11/06/2015 11/06/2015  . Erosive gastritis: per EGD 11/05/2105 11/06/2015  . Hemorrhoids; Grade 1 per colonoscopy 11/06/2015 11/06/2015   Past Surgical History  Procedure Laterality Date  . Eye surgery    . Esophagogastroduodenoscopy (egd) with propofol N/A 11/06/2015    Procedure: ESOPHAGOGASTRODUODENOSCOPY (EGD) WITH PROPOFOL;  Surgeon: Ladene Artist, MD;  Location: [facility];  Service: Endoscopy;  Laterality: N/A;  . Colonoscopy with  propofol N/A 11/06/2015    Procedure: COLONOSCOPY WITH PROPOFOL;  Surgeon: Ladene Artist, MD;  Location: [facility];  Service: Endoscopy;  Laterality: N/A;   Family History  Problem Relation Age of Onset  . Heart disease Father   . Heart disease Brother    Social History  Substance Use Topics  . Smoking status: Never Smoker   . Smokeless tobacco: None  . Alcohol Use: No   OB History    No data available     Review of Systems  Ten systems reviewed and are negative for acute change, except as noted in the HPI.    Allergies  Sulfa antibiotics  Home Medications   Prior to Admission medications   Medication Sig Start Date End Date Taking? Authorizing Provider  Cyanocobalamin (VITAMIN B 12 PO) Take 1 tablet by mouth daily. Reported on 01/19/2016    Historical Provider, MD  glucosamine-chondroitin 500-400 MG tablet Take 1 tablet by mouth daily.    Historical Provider, MD  iron polysaccharides (NIFEREX) 150 MG capsule Take 1 capsule (150 mg total) by mouth daily. 11/06/15   Eugenie Filler, MD  Multiple Vitamins-Minerals (HAIR/SKIN/NAILS PO) Take 1 tablet by mouth 3 (three) times daily.    Historical Provider, MD  pantoprazole (PROTONIX) 40 MG tablet Take 1 tablet (40 mg total) by mouth daily. 01/08/16   Gay Filler Copland, MD  traMADol (ULTRAM) 50 MG tablet Take 1 tablet (50 mg total) by mouth every 8 (eight) hours as needed for moderate pain. 12/17/15   Darreld Mclean, MD  BP 125/65 mmHg  Pulse 67  Temp(Src) 97.6 F (36.4 C) (Oral)  Resp 15  SpO2 100% Physical Exam  Constitutional: She is oriented to person, place, and time. She appears well-developed and well-nourished. No distress.  HENT:  Head: Normocephalic and atraumatic.  Eyes: Conjunctivae are normal. No scleral icterus.  Neck: Normal range of motion.  Cardiovascular: Normal rate, regular rhythm, normal heart sounds and intact distal pulses.  Exam reveals no gallop and no friction rub.   No murmur heard. No  peripheral edema  Pulmonary/Chest: Effort normal. No respiratory distress.    Abdominal: Soft. Bowel sounds are normal. She exhibits no distension and no mass. There is no tenderness. There is no guarding.  Neurological: She is alert and oriented to person, place, and time.  Skin: Skin is warm and dry. She is not diaphoretic.  Nursing note and vitals reviewed.   ED Course  Procedures (including critical care time) Labs Review Labs Reviewed  C DIFFICILE QUICK SCREEN W PCR REFLEX  CBC WITH DIFFERENTIAL/PLATELET  COMPREHENSIVE METABOLIC PANEL  LIPASE, BLOOD    Imaging Review No results found. I have personally reviewed and evaluated these images and lab results as part of my medical decision-making.   EKG Interpretation None      MDM   Final diagnoses:  None    11:24 AM BP 125/65 mmHg  Pulse 67  Temp(Src) 97.6 F (36.4 C) (Oral)  Resp 15  SpO2 100% Patient with wkaness, cough, diarrhea. Labs pending.    4:02 PM  Patient given decadron, albuterol. Improved breathing, Awainting UA, If negative, ambulate with 02 Needs ambulation with 02. Discussed with Delrae Rend PA-C who will assume care.  Medications  sodium chloride 0.9 % bolus 500 mL (0 mLs Intravenous Stopped 02/07/16 1427)  ipratropium-albuterol (DUONEB) 0.5-2.5 (3) MG/3ML nebulizer solution 3 mL (3 mLs Nebulization Given 02/07/16 1122)  sodium chloride 0.9 % bolus 1,000 mL (0 mLs Intravenous Stopped 02/07/16 1556)  dexamethasone (DECADRON) injection 10 mg (10 mg Intravenous Given 02/07/16 1434)  albuterol (PROVENTIL) (2.5 MG/3ML) 0.083% nebulizer solution 5 mg (5 mg Nebulization Given 02/07/16 1434)      Margarita Mail, PA-C 02/07/16 1604  Dorie Rank, MD 02/11/16 [phone]

## 2016-02-07 NOTE — Discharge Instructions (Signed)
You were seen in the ER today for evaluation of cough, congestion, weakness. Your labs including bloodwork, urine test, and chest x-ray were normal. I suspect you have a viral bronchitis. I will give you several prescriptions to help with your cough and wheezing. Please take as directed. Please call your primary care provider to schedule a follow up appointment this week. Return to the ER for new or worsening symptoms.

## 2016-02-07 NOTE — ED Provider Notes (Signed)
Care assumed from [facility]. Kathy Thornton is an 82 y.o. female who presents with two weeks of URI symptoms including productive cough, feeling generally weak. She does have a history of chronic diarrhea and had a GI bleed in 11/2015 but no new GI symptoms since. Her workup thus far has been unrevealing. She does have some diffuse wheezing which persists after one duoneb and one 5mg  albuterol neb, though pt subjectively feels better. She is afebrile and has had no hypoxia. UA is pending. At this time, given negative workup, pending unremarkable UA plan is to ambulate pt and if she maintains appropriate SpO2 discharge home with supportive meds.  In the room pt is resting comfortably with no increased WOB. SHe does have diffuse bilateral coarse breath sounds and end expiratory wheezing. No hypoxia. VSS.    Physical Exam  BP 112/58 mmHg  Pulse 75  Temp(Src) 97.6 F (36.4 C) (Oral)  Resp 14  SpO2 100%  Physical Exam  Constitutional: She is oriented to person, place, and time. No distress.  HENT:  Head: Atraumatic.  Right Ear: External ear normal.  Left Ear: External ear normal.  Nose: Nose normal.  Eyes: Conjunctivae are normal. No scleral icterus.  Neck: Normal range of motion. Neck supple.  Cardiovascular: Normal rate and regular rhythm.   Pulmonary/Chest: Effort normal. No respiratory distress. She exhibits no tenderness.  Bilateral coarse breath sounds, rhonchi, and end expiratory wheezing.  Abdominal: Soft. She exhibits no distension. There is no tenderness.  Neurological: She is alert and oriented to person, place, and time.  Skin: Skin is warm and dry. She is not diaphoretic.  Psychiatric: She has a normal mood and affect. Her behavior is normal.  Nursing note and vitals reviewed.  Filed Vitals:   02/07/16 1500 02/07/16 1530 02/07/16 1626 02/07/16 1627  BP: 128/50 132/57 140/65   Pulse: 80 81 80   Temp:      TempSrc:      Resp: 19 21 25    SpO2: 99% 97% 98% 98%      ED Course  Procedures  Results for orders placed or performed during the hospital encounter of 02/07/16  CBC with Differential  Result Value Ref Range   WBC 8.2 4.0 - 10.5 K/uL   RBC 4.13 3.87 - 5.11 MIL/uL   Hemoglobin 12.3 12.0 - 15.0 g/dL   HCT 37.3 36.0 - 46.0 %   MCV 90.3 78.0 - 100.0 fL   MCH 29.8 26.0 - 34.0 pg   MCHC 33.0 30.0 - 36.0 g/dL   RDW 15.3 11.5 - 15.5 %   Platelets 158 150 - 400 K/uL   Neutrophils Relative % 76 %   Lymphocytes Relative 19 %   Monocytes Relative 5 %   Eosinophils Relative 0 %   Basophils Relative 0 %   Neutro Abs 6.2 1.7 - 7.7 K/uL   Lymphs Abs 1.6 0.7 - 4.0 K/uL   Monocytes Absolute 0.4 0.1 - 1.0 K/uL   Eosinophils Absolute 0.0 0.0 - 0.7 K/uL   Basophils Absolute 0.0 0.0 - 0.1 K/uL   Smear Review MORPHOLOGY UNREMARKABLE   Comprehensive metabolic panel  Result Value Ref Range   Sodium 142 135 - 145 mmol/L   Potassium 3.6 3.5 - 5.1 mmol/L   Chloride 109 101 - 111 mmol/L   CO2 22 22 - 32 mmol/L   Glucose, Bld 99 65 - 99 mg/dL   BUN 14 6 - 20 mg/dL   Creatinine, Ser 1.10 (H) 0.44 - 1.00  mg/dL   Calcium 8.5 (L) 8.9 - 10.3 mg/dL   Total Protein 6.4 (L) 6.5 - 8.1 g/dL   Albumin 3.3 (L) 3.5 - 5.0 g/dL   AST 17 15 - 41 U/L   ALT 11 (L) 14 - 54 U/L   Alkaline Phosphatase 67 38 - 126 U/L   Total Bilirubin 0.3 0.3 - 1.2 mg/dL   GFR calc non Af Amer 46 (L) >60 mL/min   GFR calc Af Amer 54 (L) >60 mL/min   Anion gap 11 5 - 15  Lipase, blood  Result Value Ref Range   Lipase 26 11 - 51 U/L  Urinalysis, Routine w reflex microscopic (not at [facility])  Result Value Ref Range   Color, Urine YELLOW YELLOW   APPearance CLOUDY (A) CLEAR   Specific Gravity, Urine 1.016 1.005 - 1.030   pH 6.0 5.0 - 8.0   Glucose, UA NEGATIVE NEGATIVE mg/dL   Hgb urine dipstick NEGATIVE NEGATIVE   Bilirubin Urine NEGATIVE NEGATIVE   Ketones, ur 15 (A) NEGATIVE mg/dL   Protein, ur NEGATIVE NEGATIVE mg/dL   Nitrite NEGATIVE NEGATIVE   Leukocytes, UA NEGATIVE  NEGATIVE   Dg Chest 2 View  02/07/2016  CLINICAL DATA:  Wheezing and weakness. Nonproductive cough and diarrhea EXAM: CHEST  2 VIEW COMPARISON:  None. FINDINGS: The heart size and mediastinal contours are within normal limits. Both lungs are clear. The visualized skeletal structures are unremarkable. IMPRESSION: No active cardiopulmonary disease. Electronically Signed   By: Kerby Moors M.D.   On: 02/07/2016 11:25      MDM Pt was able to ambulate while maintaining SpO2 98-99% on RA. Her UA does have some ketones but is otherwise negative. She is stable for discharge with outpatient f/u. I discussed the plan and lab findings with pt. She is in agreement. Rx given for steroids, inhaler, and mucinex. Instructed close PCP f/u this week. ER return precautions given.      Anne Ng, PA-C 02/07/16 [place], DO 02/07/16 2320

## 2016-02-07 NOTE — ED Notes (Signed)
EMS called to home.  Found patient with multiple complaints of weakness, nonproductive cough, diarrhea. That has been on going from 1 to 3 weeks.

## 2016-02-08 ENCOUNTER — Telehealth: Payer: Self-pay | Admitting: Family Medicine

## 2016-02-08 DIAGNOSIS — M25561 Pain in right knee: Secondary | ICD-10-CM

## 2016-02-08 DIAGNOSIS — M25562 Pain in left knee: Principal | ICD-10-CM

## 2016-02-08 MED ORDER — TRAMADOL HCL 50 MG PO TABS: 50.0000 mg | ORAL_TABLET | Freq: Three times a day (TID) | ORAL | Status: DC | PRN

## 2016-02-08 NOTE — Telephone Encounter (Signed)
Pharmacy: [id number] - [place], [place] - [address] AT [address]  Reason for call: Pt needing refill on tramadol. She has 3 left. She said Raliegh Ip doctor told her to f/u with PCP for refills. Pt takes 1-2/day.

## 2016-02-11 ENCOUNTER — Telehealth: Payer: Self-pay

## 2016-02-11 NOTE — Telephone Encounter (Signed)
Per chart--pt went to the ER.

## 2016-02-11 NOTE — Telephone Encounter (Addendum)
[facility] note received via fax  Call:   Date: 02/07/16 Time: 9:22:43am   Caller: Knute Neu (daughter) Return number:  [phone]  Nurse: Bertell Maria, RN  Chief Complaint:  Fever (non urgent symptoms) (>Three months)  Reason for call:  Caller says her mom recently had a hiatal hernia.  She recently has had problems with diarrhea and had a cold.  This am she woke up with fever as well.  She has started taking pepto bismol the past few days.  Daughter is not with mom yet so not able to fully assess but she plan to take her to the urgent care now.  No further questions or need to triage  Related visit to physician within the last 2 weeks:  No   Disposition:  Clinical Call; referrals- [facility]

## 2016-02-17 ENCOUNTER — Ambulatory Visit (INDEPENDENT_AMBULATORY_CARE_PROVIDER_SITE_OTHER): Payer: Medicare Other | Admitting: Family Medicine

## 2016-02-17 ENCOUNTER — Encounter: Payer: Self-pay | Admitting: Family Medicine

## 2016-02-17 VITALS — BP 148/78 | HR 91 | Ht 60.0 in | Wt 160.0 lb

## 2016-02-17 DIAGNOSIS — J069 Acute upper respiratory infection, unspecified: Secondary | ICD-10-CM | POA: Diagnosis not present

## 2016-02-17 DIAGNOSIS — Z8711 Personal history of peptic ulcer disease: Secondary | ICD-10-CM

## 2016-02-17 DIAGNOSIS — R062 Wheezing: Secondary | ICD-10-CM

## 2016-02-17 DIAGNOSIS — Z8719 Personal history of other diseases of the digestive system: Secondary | ICD-10-CM

## 2016-02-17 NOTE — Patient Instructions (Signed)
I am so glad to see you doing well!  Be sure to see your GI doctor next month Use the inhaler as needed for wheezing- let me know if you are not continuing to get better Let's plan to recheck in 6 months

## 2016-02-17 NOTE — Progress Notes (Signed)
[facility] [address] [place], [place] [address] [phone]  Date:  02/17/2016   Name:  Kathy Thornton   DOB:  11/08/1934   MRN:  [id number]  PCP:  Lamar Blinks, MD    Chief Complaint: Hospitalization Follow-up   History of Present Illness:  Kathy Thornton is a 82 y.o. very pleasant female patient who presents with the following:  Here today for a hospital follow-up; she was seen in the ER (not admitted0 on 3/5 with viral URI, cough, weakness.  She was sent home with albuterol, prednisone, tesalon and mucinex. Negative CXR.  She is doing "a whole lot better."  Breathing is mostly back to normal but she will wheeze some in the afternoons. She finished the prednisone after 2 days of treatment She is using the tessalon perles and mucinex still, is using the albuterol at times  She does not have asthma and does not generally wheeze She did have cortisone shots in her knees and this has helped her.  She is getting around a lot better now  She did have a bleeding gastric ulcer in December of 2016 that led to severe anemia but has been getting better in this regard. She is no longer on iron and her recent CBC was normal She will see her GI doctor next month for a follow-up  Patient Active Problem List   Diagnosis Date Noted  . [name] lesion, acute: Per EGD 11/06/2015 11/06/2015  . Erosive gastritis: per EGD 11/05/2105 11/06/2015  . Hemorrhoids; Grade 1 per colonoscopy 11/06/2015 11/06/2015  . Anemia, iron deficiency   . Acute gastric ulcer   . Anemia 11/05/2015  . Symptomatic anemia 11/05/2015  . Heart murmur 11/05/2015  . UTI (lower urinary tract infection) 11/05/2015  . Lower extremity edema 11/05/2015  . Widened pulse pressure 11/05/2015  . Absolute anemia   . Arthritis of both knees 11/04/2015    Past Medical History  Diagnosis Date  . Knee pain   . Cataract   . [name] lesion, acute: Per EGD 11/06/2015  11/06/2015  . Erosive gastritis: per EGD 11/05/2105 11/06/2015  . Hemorrhoids; Grade 1 per colonoscopy 11/06/2015 11/06/2015    Past Surgical History  Procedure Laterality Date  . Eye surgery    . Esophagogastroduodenoscopy (egd) with propofol N/A 11/06/2015    Procedure: ESOPHAGOGASTRODUODENOSCOPY (EGD) WITH PROPOFOL;  Surgeon: Ladene Artist, MD;  Location: [facility];  Service: Endoscopy;  Laterality: N/A;  . Colonoscopy with propofol N/A 11/06/2015    Procedure: COLONOSCOPY WITH PROPOFOL;  Surgeon: Ladene Artist, MD;  Location: [facility];  Service: Endoscopy;  Laterality: N/A;    Social History  Substance Use Topics  . Smoking status: Never Smoker   . Smokeless tobacco: None  . Alcohol Use: No    Family History  Problem Relation Age of Onset  . Heart disease Father   . Heart disease Brother     Allergies  Allergen Reactions  . Sulfa Antibiotics Hives    Medication list has been reviewed and updated.  Current Outpatient Prescriptions on File Prior to Visit  Medication Sig Dispense Refill  . acetaminophen (TYLENOL) 500 MG tablet Take 1,000 mg by mouth every 6 (six) hours as needed for mild pain, moderate pain or headache.    . albuterol (PROVENTIL HFA;VENTOLIN HFA) 108 (90 Base) MCG/ACT inhaler Inhale 1-2 puffs into the lungs every 6 (six) hours as needed for wheezing or shortness of breath. 1  Inhaler 0  . benzonatate (TESSALON) 100 MG capsule Take 1 capsule (100 mg total) by mouth every 8 (eight) hours. 21 capsule 0  . Cyanocobalamin (VITAMIN B 12 PO) Take 1 tablet by mouth daily. Reported on 01/19/2016    . guaiFENesin (MUCINEX) 600 MG 12 hr tablet Take 1 tablet (600 mg total) by mouth 2 (two) times daily as needed for to loosen phlegm. 20 tablet 0  . pantoprazole (PROTONIX) 40 MG tablet Take 1 tablet (40 mg total) by mouth daily. 90 tablet 1  . PRESCRIPTION MEDICATION Cortisone injections in both knees    . traMADol (ULTRAM) 50 MG tablet Take 1 tablet (50 mg total) by  mouth every 8 (eight) hours as needed for moderate pain. 30 tablet 1   No current facility-administered medications on file prior to visit.    Review of Systems:  As per HPI- otherwise negative.   Physical Examination: Filed Vitals:   02/17/16 1339  BP: 148/78  Pulse: 91   Filed Vitals:   02/17/16 1339  Height: 5' (1.524 m)  Weight: 160 lb (72.576 kg)   Body mass index is 31.25 kg/(m^2). Ideal Body Weight: Weight in (lb) to have BMI = 25: 127.7  GEN: WDWN, NAD, Non-toxic, A & O x 3, looks well HEENT: Atraumatic, Normocephalic. Neck supple. No masses, No LAD.  Bilateral TM wnl, oropharynx normal.  PEERL,EOMI.   Ears and Nose: No external deformity. CV: RRR, No M/G/R. No JVD. No thrill. No extra heart sounds. PULM: CTA B, no crackles, rhonchi. Minimal wheezing bilaterally. No retractions. No resp. distress. No accessory muscle use. ABD: S, NT, ND, +BS. No rebound. No HSM. EXTR: No c/c/e NEURO Normal gait for pt- does favor her knees  PSYCH: Normally interactive. Conversant. Not depressed or anxious appearing.  Calm demeanor.    Assessment and Plan: Wheezing  Viral URI  History of gastric ulcer  Here today to follow-up from ER visit She is doing much better- still wheezing a bit but she notes improvement She will use the albuterol as needed- will let me know if not continuing to improve Her Hg is back to normal- will see GI next month. She will continue her protonix for now Plan to follow-up here in about 6 months   Signed Lamar Blinks, MD

## 2016-02-28 NOTE — Telephone Encounter (Signed)
completed

## 2016-02-29 DIAGNOSIS — M545 Low back pain: Secondary | ICD-10-CM | POA: Diagnosis not present

## 2016-03-15 DIAGNOSIS — M4806 Spinal stenosis, lumbar region: Secondary | ICD-10-CM | POA: Diagnosis not present

## 2016-03-15 DIAGNOSIS — M25551 Pain in right hip: Secondary | ICD-10-CM | POA: Diagnosis not present

## 2016-03-15 DIAGNOSIS — M545 Low back pain: Secondary | ICD-10-CM | POA: Diagnosis not present

## 2016-03-15 DIAGNOSIS — M5136 Other intervertebral disc degeneration, lumbar region: Secondary | ICD-10-CM | POA: Diagnosis not present

## 2016-03-17 DIAGNOSIS — M25551 Pain in right hip: Secondary | ICD-10-CM | POA: Diagnosis not present

## 2016-03-21 ENCOUNTER — Encounter: Payer: Self-pay | Admitting: Gastroenterology

## 2016-03-21 ENCOUNTER — Ambulatory Visit (INDEPENDENT_AMBULATORY_CARE_PROVIDER_SITE_OTHER): Payer: Medicare Other | Admitting: Gastroenterology

## 2016-03-21 VITALS — BP 178/78 | HR 84 | Ht 60.0 in | Wt 160.0 lb

## 2016-03-21 DIAGNOSIS — K259 Gastric ulcer, unspecified as acute or chronic, without hemorrhage or perforation: Secondary | ICD-10-CM

## 2016-03-21 NOTE — Progress Notes (Signed)
Review of pertinent gastrointestinal problems: 1. IDA: presented 11/2015, workup EGD and colonoscopy.  EGD showed small gastric ulcer, Cameron's erosions and hiatal hernia. Colonoscopy was essentially normal without polyps. She had been taking frequent NSAIDs including Goody powders nearly daily. Biopsies of the stomach showed no H. pylori  HPI: This is a   very pleasant 82 year old woman who is here with her daughter today.  Chief complaint is history of gastric ulcer, anemia  Has been feeling overall well except for arthritis pains.  Stopped iron.    She has completely avoided NSAID type meds.  One pill protonix once daily.  She has had no overt bleeding since leaving the hospital.  EGD Dr. Fuller Plan 11/2015: 1. 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 Single ulcer in the gastric antrum; biopsies were taken (no h. Pylori) 3. Erosive gastritis in the gastric body and gastric antrum 4. 6 cm hiatal hernia  Colonoscopy Dr. Fuller Plan 11/2015; hemorrhoids o/w normal  Hb 7.7 at low, currently 12.3    Past Medical History  Diagnosis Date  . Knee pain   . Cataract   . Kathy Thornton lesion, acute: Per EGD 11/06/2015 11/06/2015  . Erosive gastritis: per EGD 11/05/2105 11/06/2015  . Hemorrhoids; Grade 1 per colonoscopy 11/06/2015 11/06/2015    Past Surgical History  Procedure Laterality Date  . Eye surgery    . Esophagogastroduodenoscopy (egd) with propofol N/A 11/06/2015    Procedure: ESOPHAGOGASTRODUODENOSCOPY (EGD) WITH PROPOFOL;  Surgeon: Ladene Artist, MD;  Location: [facility];  Service: Endoscopy;  Laterality: N/A;  . Colonoscopy with propofol N/A 11/06/2015    Procedure: COLONOSCOPY WITH PROPOFOL;  Surgeon: Ladene Artist, MD;  Location: [facility];  Service: Endoscopy;  Laterality: N/A;    Current Outpatient Prescriptions  Medication Sig Dispense Refill  . acetaminophen (TYLENOL) 500 MG tablet Take 1,000 mg by mouth every 6 (six) hours as needed for mild pain, moderate  pain or headache.    . pantoprazole (PROTONIX) 40 MG tablet Take 1 tablet (40 mg total) by mouth daily. 90 tablet 1  . PRESCRIPTION MEDICATION Cortisone injections in both knees    . traMADol (ULTRAM) 50 MG tablet Take 1 tablet (50 mg total) by mouth every 8 (eight) hours as needed for moderate pain. 30 tablet 1   No current facility-administered medications for this visit.    Allergies as of 03/21/2016 - Review Complete 03/21/2016  Allergen Reaction Noted  . Sulfa antibiotics Hives 06/09/2012    Family History  Problem Relation Age of Onset  . Heart disease Father   . Heart disease Brother     Social History   Social History  . Marital Status: Married    Spouse Name: N/A  . Number of Children: N/A  . Years of Education: N/A   Occupational History  . Not on file.   Social History Main Topics  . Smoking status: Never Smoker   . Smokeless tobacco: Not on file  . Alcohol Use: No  . Drug Use: No  . Sexual Activity: Not on file   Other Topics Concern  . Not on file   Social History Narrative     Physical Exam: BP 178/78 mmHg  Pulse 84  Ht 5' (1.524 m)  Wt 160 lb (72.576 kg)  BMI 31.25 kg/m2 Constitutional: generally well-appearing Psychiatric: alert and oriented x3 Abdomen: soft, nontender, nondistended, no obvious ascites, no peritoneal signs, normal bowel sounds   Assessment and plan: 82 y.o. female with History of gastric ulcer, hiatal hernia with Cameron's  erosions  Likely the gastric ulcer was NSAID related. Hiatal hernia with Cameron's erosions and this gastric ulcer were likely the cause of her chronic iron deficiency anemia. Her anemia has completely resolved after iron replacement therapy. She is on proton pump inhibitor once daily and she is avoiding NSAIDs. I told her she should repeat the upper endoscopy as is standard recommendation to check for gastric ulcer healing. We will arrange for that to be done at her soonest convenience. If her stomach looks  perfectly normal without gastritis I will likely recommend she back down to 20 mg Protonix once daily, indefinitely.   Owens Loffler, MD [facility] 03/21/2016, 1:43 PM

## 2016-03-21 NOTE — Patient Instructions (Signed)
You will be set up for an upper endoscopy to check for gastric ulcer healing. Stay on protonix one pill once daily.

## 2016-03-22 ENCOUNTER — Encounter: Payer: Self-pay | Admitting: Gastroenterology

## 2016-03-22 ENCOUNTER — Ambulatory Visit (AMBULATORY_SURGERY_CENTER): Payer: Medicare Other | Admitting: Gastroenterology

## 2016-03-22 VITALS — BP 127/67 | HR 61 | Temp 99.6°F | Resp 12 | Ht 60.0 in | Wt 160.0 lb

## 2016-03-22 DIAGNOSIS — K259 Gastric ulcer, unspecified as acute or chronic, without hemorrhage or perforation: Secondary | ICD-10-CM | POA: Diagnosis not present

## 2016-03-22 DIAGNOSIS — K219 Gastro-esophageal reflux disease without esophagitis: Secondary | ICD-10-CM | POA: Diagnosis not present

## 2016-03-22 MED ORDER — SODIUM CHLORIDE 0.9 % IV SOLN: 500.0000 mL | INTRAVENOUS | Status: DC

## 2016-03-22 NOTE — Op Note (Signed)
[place] Patient Name: Kathy Thornton Procedure Date: 03/22/2016 3:19 PM MRN: [id number] Endoscopist: Milus Banister , MD Age: 82 Date of Birth: 16-Jun-1934 Gender: Female Procedure:                Upper GI endoscopy Indications:              Follow-up of peptic ulcer (11/2015 EGD showed small                            gastric ulcer in antrum, Cameron's erosions and                            hiatal hernia. + NSAIDs) Medicines:                Monitored Anesthesia Care Procedure:                Pre-Anesthesia Assessment:                           - Prior to the procedure, a History and Physical                            was performed, and patient medications and                            allergies were reviewed. The patient's tolerance of                            previous anesthesia was also reviewed. The risks                            and benefits of the procedure and the sedation                            options and risks were discussed with the patient.                            All questions were answered, and informed consent                            was obtained. Prior Anticoagulants: The patient has                            taken no previous anticoagulant or antiplatelet                            agents. ASA Grade Assessment: II - A patient with                            mild systemic disease. After reviewing the risks                            and benefits, the patient was deemed in  satisfactory condition to undergo the procedure.                           After obtaining informed consent, the endoscope was                            passed under direct vision. Throughout the                            procedure, the patient's blood pressure, pulse, and                            oxygen saturations were monitored continuously. The                            Model GIF-[id number] scope was introduced            through the mouth, and advanced to the second part                            of duodenum. The upper GI endoscopy was                            accomplished without difficulty. The patient                            tolerated the procedure well. Scope In: Scope Out: Findings:                 A large hiatal hernia was present. The previously                            noted Cameron's erosions have resolved. The                            previously noted gastric ulcer has completely                            healed.                           The exam was otherwise without abnormality. Complications:            No immediate complications. Estimated blood loss:                            None. Estimated Blood Loss:     Estimated blood loss: none. Impression:               - Large hiatal hernia.                           - The examination was otherwise normal. The                            previously noted Cameron's erosions have resolved.  The previously noted gastric ulcer has completely                            healed.                           - No specimens collected. Recommendation:           - Patient has a contact number available for                            emergencies. The signs and symptoms of potential                            delayed complications were discussed with the                            patient. Return to normal activities tomorrow.                            Written discharge instructions were provided to the                            patient.                           - Resume previous diet.                           - Continue present medications. Please continue                            once daily protonix.                           - No repeat upper endoscopy. Milus Banister, MD 03/22/2016 3:31:59 PM This report has been signed electronically.

## 2016-03-22 NOTE — Progress Notes (Signed)
Report to PACU, RN, vss, BBS= Clear.  

## 2016-03-22 NOTE — Patient Instructions (Addendum)
YOU HAD AN ENDOSCOPIC PROCEDURE TODAY AT [facility]:   Refer to the procedure report that was given to you for any specific questions about what was found during the examination.  If the procedure report does not answer your questions, please call your gastroenterologist to clarify.  If you requested that your care partner not be given the details of your procedure findings, then the procedure report has been included in a sealed envelope for you to review at your convenience later.  YOU SHOULD EXPECT: Some feelings of bloating in the abdomen. Passage of more gas than usual.  Walking can help get rid of the air that was put into your GI tract during the procedure and reduce the bloating.   Please Note:  You might notice some irritation and congestion in your nose or some drainage.  This is from the oxygen used during your procedure.  There is no need for concern and it should clear up in a day or so.  SYMPTOMS TO REPORT IMMEDIATELY:   Following upper endoscopy (EGD)  Vomiting of blood or coffee ground material  New chest pain or pain under the shoulder blades  Painful or persistently difficult swallowing  New shortness of breath  Fever of 100F or higher  Black, tarry-looking stools  For urgent or emergent issues, a gastroenterologist can be reached at any hour by calling [phone].   DIET: Your first meal following the procedure should be a small meal and then it is ok to progress to your normal diet. Heavy or fried foods are harder to digest and may make you feel nauseous or bloated.  Likewise, meals heavy in dairy and vegetables can increase bloating.  Drink plenty of fluids but you should avoid alcoholic beverages for 24 hours.  ACTIVITY:  You should plan to take it easy for the rest of today and you should NOT DRIVE or use heavy machinery until tomorrow (because of the sedation medicines used during the test).    FOLLOW UP: Our staff will call the number listed on  your records the next business day following your procedure to check on you and address any questions or concerns that you may have regarding the information given to you following your procedure. If we do not reach you, we will leave a message.  However, if you are feeling well and you are not experiencing any problems, there is no need to return our call.  We will assume that you have returned to your regular daily activities without incident.  If any biopsies were taken you will be contacted by phone or by letter within the next 1-3 weeks.  Please call us at [phone] if you have not heard about the biopsies in 3 weeks.    SIGNATURES/CONFIDENTIALITY: You and/or your care partner have signed paperwork which will be entered into your electronic medical record.  These signatures attest to the fact that that the information above on your After Visit Summary has been reviewed and is understood.  Full responsibility of the confidentiality of this discharge information lies with you and/or your care-partner.  Be sure to take your medication for reflux every day.  Thank-you for choosing [facility] for your medical needs today.

## 2016-03-23 ENCOUNTER — Telehealth: Payer: Self-pay

## 2016-03-23 NOTE — Telephone Encounter (Signed)
Left a message at [phone] for the pt to call us back if any questions or concerns. maw

## 2016-04-05 ENCOUNTER — Telehealth: Payer: Self-pay | Admitting: Family Medicine

## 2016-04-05 DIAGNOSIS — M25561 Pain in right knee: Secondary | ICD-10-CM

## 2016-04-05 DIAGNOSIS — M25562 Pain in left knee: Principal | ICD-10-CM

## 2016-04-05 MED ORDER — TRAMADOL HCL 50 MG PO TABS: 50.0000 mg | ORAL_TABLET | Freq: Three times a day (TID) | ORAL | Status: DC | PRN

## 2016-04-05 NOTE — Telephone Encounter (Signed)
Caller name:Self  Can be reached: [phone]  Pharmacy:  [id number] - [place], [place] [phone] (Phone) [phone] (Fax)       Reason for call: Refill traMADol (ULTRAM) 50 MG tablet [id number]

## 2016-04-18 DIAGNOSIS — M25551 Pain in right hip: Secondary | ICD-10-CM | POA: Diagnosis not present

## 2016-04-18 DIAGNOSIS — M545 Low back pain: Secondary | ICD-10-CM | POA: Diagnosis not present

## 2016-05-23 DIAGNOSIS — M170 Bilateral primary osteoarthritis of knee: Secondary | ICD-10-CM | POA: Diagnosis not present

## 2016-06-08 ENCOUNTER — Other Ambulatory Visit: Payer: Self-pay | Admitting: Family Medicine

## 2016-07-15 ENCOUNTER — Other Ambulatory Visit: Payer: Self-pay | Admitting: Family Medicine

## 2016-07-15 NOTE — Telephone Encounter (Signed)
Received refill request for traMADol (ULTRAM) 50 MG tablet. Pt last office visit: 02/17/16 and last refill was 06/09/16. Is it ok to refill? Please advise. Thanks

## 2016-07-16 ENCOUNTER — Other Ambulatory Visit: Payer: Self-pay | Admitting: Family Medicine

## 2016-07-16 NOTE — Telephone Encounter (Signed)
OK to refill

## 2016-07-18 NOTE — Telephone Encounter (Signed)
Rx faxed to pharmacy  

## 2016-08-16 ENCOUNTER — Telehealth: Payer: Self-pay | Admitting: *Deleted

## 2016-08-16 MED ORDER — TRAMADOL HCL 50 MG PO TABS: ORAL_TABLET | ORAL | 0 refills | Status: DC

## 2016-08-16 NOTE — Telephone Encounter (Signed)
I filled this last month for the patient.  I will defer further refills to Dr. Lorelei Pont.

## 2016-08-16 NOTE — Telephone Encounter (Signed)
Received fax from [facility] requesting refill of tramadol, 50mg , #30.  Last RX: 07/15/16 Last OV: 02/17/16 Next OV: due now UDS: none on file  Please advise in Dr Copland's absence today?

## 2016-08-22 ENCOUNTER — Encounter: Payer: Self-pay | Admitting: Family Medicine

## 2016-08-22 ENCOUNTER — Ambulatory Visit (INDEPENDENT_AMBULATORY_CARE_PROVIDER_SITE_OTHER): Payer: Medicare Other | Admitting: Family Medicine

## 2016-08-22 VITALS — BP 132/82 | HR 65 | Temp 97.8°F | Ht 60.0 in | Wt 155.4 lb

## 2016-08-22 DIAGNOSIS — Z23 Encounter for immunization: Secondary | ICD-10-CM | POA: Diagnosis not present

## 2016-08-22 DIAGNOSIS — H1030 Unspecified acute conjunctivitis, unspecified eye: Secondary | ICD-10-CM

## 2016-08-22 DIAGNOSIS — Z131 Encounter for screening for diabetes mellitus: Secondary | ICD-10-CM

## 2016-08-22 DIAGNOSIS — Z79899 Other long term (current) drug therapy: Secondary | ICD-10-CM | POA: Diagnosis not present

## 2016-08-22 DIAGNOSIS — Z13220 Encounter for screening for lipoid disorders: Secondary | ICD-10-CM

## 2016-08-22 MED ORDER — POLYMYXIN B-TRIMETHOPRIM 10000-0.1 UNIT/ML-% OP SOLN: 1.0000 [drp] | OPHTHALMIC | 0 refills | Status: DC

## 2016-08-22 NOTE — Progress Notes (Signed)
[place] at [facility] [address] [place], [place] [address] [phone]  Date:  08/22/2016   Name:  Kathy Thornton   DOB:  Mar 26, 1934   MRN:  [id number]  PCP:  Lamar Blinks, MD    Chief Complaint: Follow-up (Pt here for 6 month f/u, flu vaccine and lab work. ) and Burning Eyes (c/o right eye redness, itching and burning x 2-3 days. )   History of Present Illness:  Kathy Thornton is a 82 y.o. very pleasant female patient who presents with the following:  Here today to follow-up. She had an ulcer earlier this year,but recovered well She has noted a problem with her right eye being red, weepy, itchy. She has noted this issue for 3-4 days. The left eye is ok  She has crusting in the morning which she has to wipe with a tissue The eye is "a little bit sore" but not painful She does not wear any corrective lenses since she had her eye surgery - she did have lenses implanted and cataracts removed Vision is ok, unchanged.    She got shots in her knees and is able to exercise more easily now.  No sneeze or cough Her stomach is feeling good  Anemia from her ulcer was complelty resolved in March She still takes a tramadol once a day for her knees, but by doing this she is able to walk 1-2 hours a day  BP Readings from Last 3 Encounters:  08/22/16 132/82  03/22/16 127/67  03/21/16 (!) 178/78   Wt Readings from Last 3 Encounters:  08/22/16 155 lb 6.4 oz (70.5 kg)  03/22/16 160 lb (72.6 kg)  03/21/16 160 lb (72.6 kg)    Patient Active Problem List   Diagnosis Date Noted  . [name] lesion, acute: Per EGD 11/06/2015 11/06/2015  . Erosive gastritis: per EGD 11/05/2105 11/06/2015  . Hemorrhoids; Grade 1 per colonoscopy 11/06/2015 11/06/2015  . Anemia, iron deficiency   . Acute gastric ulcer   . Anemia 11/05/2015  . Symptomatic anemia 11/05/2015  . Heart murmur 11/05/2015  . UTI (lower urinary tract infection) 11/05/2015  .  Lower extremity edema 11/05/2015  . Widened pulse pressure 11/05/2015  . Absolute anemia   . Arthritis of both knees 11/04/2015    Past Medical History:  Diagnosis Date  . [name] lesion, acute: Per EGD 11/06/2015 11/06/2015  . Cataract   . Erosive gastritis: per EGD 11/05/2105 11/06/2015  . Hemorrhoids; Grade 1 per colonoscopy 11/06/2015 11/06/2015  . Knee pain     Past Surgical History:  Procedure Laterality Date  . COLONOSCOPY WITH PROPOFOL N/A 11/06/2015   Procedure: COLONOSCOPY WITH PROPOFOL;  Surgeon: Ladene Artist, MD;  Location: [facility];  Service: Endoscopy;  Laterality: N/A;  . ESOPHAGOGASTRODUODENOSCOPY (EGD) WITH PROPOFOL N/A 11/06/2015   Procedure: ESOPHAGOGASTRODUODENOSCOPY (EGD) WITH PROPOFOL;  Surgeon: Ladene Artist, MD;  Location: [facility];  Service: Endoscopy;  Laterality: N/A;  . EYE SURGERY      Social History  Substance Use Topics  . Smoking status: Never Smoker  . Smokeless tobacco: Not on file  . Alcohol use No    Family History  Problem Relation Age of Onset  . Heart disease Father   . Heart disease Brother     Allergies  Allergen Reactions  . Sulfa Antibiotics Hives    Medication list has been reviewed and updated.  Current Outpatient Prescriptions on File Prior to Visit  Medication  Sig Dispense Refill  . acetaminophen (TYLENOL) 500 MG tablet Take 1,000 mg by mouth every 6 (six) hours as needed for mild pain, moderate pain or headache.    . pantoprazole (PROTONIX) 40 MG tablet TAKE 1 TABLET BY MOUTH DAILY 90 tablet 0  . PRESCRIPTION MEDICATION Cortisone injections in both knees    . traMADol (ULTRAM) 50 MG tablet TAKE 1 TABLET BY MOUTH EVERY 8 HOURS AS NEEDED FOR MODERATE PAIN 30 tablet 0   No current facility-administered medications on file prior to visit.     Review of Systems:  As per HPI- otherwise negative.   Physical Examination: Vitals:   08/22/16 1522  BP: 132/82  Pulse: 65  Temp: 97.8 F (36.6 C)   Vitals:    08/22/16 1522  Weight: 155 lb 6.4 oz (70.5 kg)  Height: 5' (1.524 m)   Body mass index is 30.35 kg/m. Ideal Body Weight: Weight in (lb) to have BMI = 25: 127.7  GEN: WDWN, NAD, Non-toxic, A & O x 3, looks well HEENT: Atraumatic, Normocephalic. Neck supple. No masses, No LAD.  Bilateral TM wnl, oropharynx normal.  PEERL,EOMI.   The right eye is injected and conjunctiva are red- c/w pinkeye  Ears and Nose: No external deformity. CV: RRR, No M/G/R. No JVD. No thrill. No extra heart sounds. PULM: CTA B, no wheezes, crackles, rhonchi. No retractions. No resp. distress. No accessory muscle use. EXTR: No c/c/e NEURO Normal gait.  PSYCH: Normally interactive. Conversant. Not depressed or anxious appearing.  Calm demeanor.    Assessment and Plan: Acute conjunctivitis - Plan: trimethoprim-polymyxin b (POLYTRIM) ophthalmic solution  Screening for diabetes mellitus - Plan: Comprehensive metabolic panel  Screening for hyperlipidemia - Plan: Lipid panel  Medication management - Plan: CBC  Encounter for immunization - Plan: Flu vaccine HIGH DOSE PF  Here today for a follow-up visit Flu shot Also found to have pink-eye; will treat as above Asked to let me know if her eye is not feeling better in the next couple of days Will plan further follow- up pending labs.   Signed Lamar Blinks, MD

## 2016-08-22 NOTE — Patient Instructions (Addendum)
It was very nice to see you today- please go to lab for a blood draw and then you are all set to go home Use the eye drops as directed- let me know if your eye is not getting better in the next couple of days. I will be in touch with your labs asap.  Please come and see me in about 6 months

## 2016-08-23 ENCOUNTER — Encounter: Payer: Self-pay | Admitting: Family Medicine

## 2016-08-23 LAB — CBC
HCT: 36.8 % (ref 36.0–46.0)
Hemoglobin: 12.1 g/dL (ref 12.0–15.0)
MCHC: 32.8 g/dL (ref 30.0–36.0)
MCV: 87.3 fl (ref 78.0–100.0)
PLATELETS: 204 10*3/uL (ref 150.0–400.0)
RBC: 4.21 Mil/uL (ref 3.87–5.11)
RDW: 15.1 % (ref 11.5–15.5)
WBC: 6.9 10*3/uL (ref 4.0–10.5)

## 2016-08-23 LAB — COMPREHENSIVE METABOLIC PANEL
ALT: 9 U/L (ref 0–35)
AST: 15 U/L (ref 0–37)
Albumin: 4.2 g/dL (ref 3.5–5.2)
Alkaline Phosphatase: 111 U/L (ref 39–117)
BILIRUBIN TOTAL: 0.5 mg/dL (ref 0.2–1.2)
BUN: 20 mg/dL (ref 6–23)
CHLORIDE: 106 meq/L (ref 96–112)
CO2: 29 meq/L (ref 19–32)
CREATININE: 1 mg/dL (ref 0.40–1.20)
Calcium: 9.5 mg/dL (ref 8.4–10.5)
GFR: 56.62 mL/min — ABNORMAL LOW (ref >60.00)
GLUCOSE: 80 mg/dL (ref 70–99)
Potassium: 4.6 mEq/L (ref 3.5–5.1)
SODIUM: 141 meq/L (ref 135–145)
Total Protein: 7.2 g/dL (ref 6.0–8.3)

## 2016-08-23 LAB — LIPID PANEL
Cholesterol: 152 mg/dL (ref 0–200)
HDL: 66.6 mg/dL (ref >39.00)
LDL CALC: 77 mg/dL (ref 0–99)
NONHDL: 84.91
Total CHOL/HDL Ratio: 2
Triglycerides: 42 mg/dL (ref 0.0–149.0)
VLDL: 8.4 mg/dL (ref 0.0–40.0)

## 2016-09-06 DIAGNOSIS — M170 Bilateral primary osteoarthritis of knee: Secondary | ICD-10-CM | POA: Diagnosis not present

## 2016-09-13 ENCOUNTER — Other Ambulatory Visit: Payer: Self-pay | Admitting: Family Medicine

## 2016-09-13 MED ORDER — TRAMADOL HCL 50 MG PO TABS: ORAL_TABLET | ORAL | 1 refills | Status: DC

## 2016-09-13 NOTE — Telephone Encounter (Signed)
Patient's daughter is calling requesting a refill of patient's traMADol (ULTRAM) 50 MG tablet   Caller:Cynthia  Patient relation: Daughter Dance movement psychotherapist Phone: [phone] Pharmacy:  [facility], [place]

## 2016-09-29 NOTE — Telephone Encounter (Signed)
Rx phoned in by PCP on 09/13/16.  KP

## 2016-10-12 ENCOUNTER — Other Ambulatory Visit: Payer: Self-pay | Admitting: Emergency Medicine

## 2016-10-12 ENCOUNTER — Other Ambulatory Visit: Payer: Self-pay | Admitting: Family Medicine

## 2016-10-12 MED ORDER — PANTOPRAZOLE SODIUM 40 MG PO TBEC: 40.0000 mg | DELAYED_RELEASE_TABLET | Freq: Every day | ORAL | 0 refills | Status: DC

## 2016-11-11 ENCOUNTER — Other Ambulatory Visit: Payer: Self-pay | Admitting: Family Medicine

## 2016-11-11 MED ORDER — TRAMADOL HCL 50 MG PO TABS: ORAL_TABLET | ORAL | 1 refills | Status: DC

## 2016-11-11 NOTE — Telephone Encounter (Signed)
Patient's daughter called requesting a refill of patient's traMADol (ULTRAM) 50 MG tablet  Please advise.   Pharmacy: [facility] [place], [place]

## 2016-12-01 ENCOUNTER — Telehealth: Payer: Self-pay | Admitting: Family Medicine

## 2016-12-01 NOTE — Telephone Encounter (Signed)
Daughter will call back tomorrow to schedule 6 month follow up for March and medicare wellness appointment prior to follow up

## 2017-01-04 DIAGNOSIS — M25551 Pain in right hip: Secondary | ICD-10-CM | POA: Diagnosis not present

## 2017-01-08 ENCOUNTER — Other Ambulatory Visit: Payer: Self-pay | Admitting: Family Medicine

## 2017-01-10 ENCOUNTER — Telehealth: Payer: Self-pay | Admitting: Family Medicine

## 2017-01-10 NOTE — Telephone Encounter (Signed)
Attempted to call patient to schedule awv. Patient did not answer. Will attempt to call patient again.  °

## 2017-01-16 ENCOUNTER — Other Ambulatory Visit: Payer: Self-pay | Admitting: Family Medicine

## 2017-01-16 ENCOUNTER — Telehealth: Payer: Self-pay | Admitting: Family Medicine

## 2017-01-16 NOTE — Telephone Encounter (Signed)
If you approve this refill I can call it in. Please advise.

## 2017-01-16 NOTE — Telephone Encounter (Signed)
Caller name:Cynthia Relation to [phone] Call back number: [phone] Pharmacy: [facility] [address]  Reason for call: pt is needing rx traMADol (ULTRAM) 50 MG tablet

## 2017-01-16 NOTE — Telephone Encounter (Signed)
Yes, that is fine.  #30 with 1 refill please. Thank you!

## 2017-01-17 ENCOUNTER — Other Ambulatory Visit: Payer: Self-pay | Admitting: Emergency Medicine

## 2017-01-17 MED ORDER — TRAMADOL HCL 50 MG PO TABS: ORAL_TABLET | ORAL | 1 refills | Status: DC

## 2017-01-17 NOTE — Telephone Encounter (Signed)
Refill called in. 

## 2017-02-03 ENCOUNTER — Ambulatory Visit: Payer: Self-pay | Admitting: Family

## 2017-02-10 ENCOUNTER — Telehealth: Payer: Self-pay | Admitting: *Deleted

## 2017-02-10 NOTE — Telephone Encounter (Signed)
Daughter states she will call back

## 2017-02-15 ENCOUNTER — Ambulatory Visit: Payer: Self-pay | Admitting: Family Medicine

## 2017-02-27 DIAGNOSIS — M170 Bilateral primary osteoarthritis of knee: Secondary | ICD-10-CM | POA: Diagnosis not present

## 2017-03-23 ENCOUNTER — Ambulatory Visit (INDEPENDENT_AMBULATORY_CARE_PROVIDER_SITE_OTHER): Payer: Medicare Other | Admitting: Family Medicine

## 2017-03-23 ENCOUNTER — Encounter: Payer: Self-pay | Admitting: Family Medicine

## 2017-03-23 VITALS — BP 160/80 | HR 70 | Temp 97.7°F | Wt 148.0 lb

## 2017-03-23 DIAGNOSIS — Z0000 Encounter for general adult medical examination without abnormal findings: Secondary | ICD-10-CM | POA: Diagnosis not present

## 2017-03-23 DIAGNOSIS — R030 Elevated blood-pressure reading, without diagnosis of hypertension: Secondary | ICD-10-CM | POA: Diagnosis not present

## 2017-03-23 DIAGNOSIS — Z8719 Personal history of other diseases of the digestive system: Secondary | ICD-10-CM

## 2017-03-23 DIAGNOSIS — Z8711 Personal history of peptic ulcer disease: Secondary | ICD-10-CM

## 2017-03-23 DIAGNOSIS — M170 Bilateral primary osteoarthritis of knee: Secondary | ICD-10-CM | POA: Diagnosis not present

## 2017-03-23 MED ORDER — TRAMADOL HCL 50 MG PO TABS: ORAL_TABLET | ORAL | 3 refills | Status: DC

## 2017-03-23 NOTE — Progress Notes (Addendum)
Subjective:   Kathy Thornton is a 83 y.o. female who presents for an Initial Medicare Annual Wellness Visit.  Review of Systems    No ROS.  Medicare Wellness Visit.  Cardiac Risk Factors include: advanced age (>57men, >29 women) Sleep patterns:  Sleeps about 8 hrs per night. Wakes once to urinate. Feels rested. Home Safety/Smoke Alarms:  Feels safe in home. Smoke alarms in place.  Living environment; residence and Firearm Safety: Lives at home with husband in 1 story home. Guns safely stored. Seat Belt Safety/Bike Helmet: Wears seat belt.   Counseling:   Eye Exam- Lenses surgically placed for 4 yrs .Dr . Tommy Rainwater. Dental-Upper and lower dentures. Gum care discussed.  Female:   Pap-  No longer due to age.     Mammo- Pt states no longer due to age.      Dexa scan-   declines     CCS- Last 11/06/15: Normal     Objective:    Today's Vitals   03/23/17 1534 03/23/17 1544 03/23/17 1600  BP: (!) 178/82 (!) 162/82 (!) 160/80  Pulse: 70    Temp: 97.7 F (36.5 C)    TempSrc: Oral    SpO2: 98%    Weight: 148 lb (67.1 kg)     Body mass index is 28.9 kg/m.   Current Medications (verified) Outpatient Encounter Prescriptions as of 03/23/2017  Medication Sig  . Biotin w/ Vitamins C & E (HAIR/SKIN/NAILS PO) Take 2 tablets by mouth daily.  . pantoprazole (PROTONIX) 40 MG tablet TAKE 1 TABLET(40 MG) BY MOUTH DAILY  . PRESCRIPTION MEDICATION Cortisone injections in both knees  . traMADol (ULTRAM) 50 MG tablet TAKE 1 TABLET BY MOUTH EVERY 8 HOURS AS NEEDED FOR MODERATE PAIN  . vitamin B-12 (CYANOCOBALAMIN) 500 MCG tablet Take 1,000 mcg by mouth daily.  Marland Kitchen VITAMIN D, CHOLECALCIFEROL, PO Take 500 mg by mouth daily.  . [DISCONTINUED] traMADol (ULTRAM) 50 MG tablet TAKE 1 TABLET BY MOUTH EVERY 8 HOURS AS NEEDED FOR MODERATE PAIN  . [DISCONTINUED] acetaminophen (TYLENOL) 500 MG tablet Take 1,000 mg by mouth every 6 (six) hours as needed for mild pain, moderate pain or headache.  .  [DISCONTINUED] Biotin w/ Vitamins C & E (HAIR/SKIN/NAILS PO) Take by mouth.  . [DISCONTINUED] Cyanocobalamin (VITAMIN B-12 PO) Take 1 tablet by mouth daily.  . [DISCONTINUED] Niacin (VITAMIN B-3 PO) Take 1 tablet by mouth daily.  . [DISCONTINUED] trimethoprim-polymyxin b (POLYTRIM) ophthalmic solution Place 1 drop into the right eye every 4 (four) hours. Use for 5- 7 days (Patient not taking: Reported on 03/23/2017)   No facility-administered encounter medications on file as of 03/23/2017.     Allergies (verified) Sulfa antibiotics   History: Past Medical History:  Diagnosis Date  . [name] lesion, acute: Per EGD 11/06/2015 11/06/2015  . Cataract   . Erosive gastritis: per EGD 11/05/2105 11/06/2015  . Hemorrhoids; Grade 1 per colonoscopy 11/06/2015 11/06/2015  . Knee pain    Past Surgical History:  Procedure Laterality Date  . COLONOSCOPY WITH PROPOFOL N/A 11/06/2015   Procedure: COLONOSCOPY WITH PROPOFOL;  Surgeon: Ladene Artist, MD;  Location: [facility];  Service: Endoscopy;  Laterality: N/A;  . ESOPHAGOGASTRODUODENOSCOPY (EGD) WITH PROPOFOL N/A 11/06/2015   Procedure: ESOPHAGOGASTRODUODENOSCOPY (EGD) WITH PROPOFOL;  Surgeon: Ladene Artist, MD;  Location: [facility];  Service: Endoscopy;  Laterality: N/A;  . EYE SURGERY     Family History  Problem Relation Age of Onset  . Heart disease Father   . Heart  disease Brother    Social History   Occupational History  . Not on file.   Social History Main Topics  . Smoking status: Never Smoker  . Smokeless tobacco: Never Used  . Alcohol use No  . Drug use: No  . Sexual activity: Not on file    Tobacco Counseling Counseling given: Not Answered   Activities of Daily Living In your present state of health, do you have any difficulty performing the following activities: 03/23/2017  Hearing? N  Vision? N  Difficulty concentrating or making decisions? N  Walking or climbing stairs? Y  Dressing or bathing? N  Doing errands,  shopping? N  Preparing Food and eating ? N  Using the Toilet? N  In the past six months, have you accidently leaked urine? Y  Do you have problems with loss of bowel control? N  Managing your Medications? N  Managing your Finances? N  Housekeeping or managing your Housekeeping? N  Some recent data might be hidden    Immunizations and Health Maintenance Immunization History  Administered Date(s) Administered  . Influenza, High Dose Seasonal PF 08/22/2016  . Influenza,inj,Quad PF,36+ Mos 11/04/2015  . Pneumococcal Conjugate-13 11/04/2015  . Tdap 06/09/2012   Health Maintenance Due  Topic Date Due  . DEXA SCAN  02/10/2001  . PNA vac Low Risk Adult (2 of 2 - PPSV23) 11/03/2016    Patient Care Team: Darreld Mclean, MD as PCP - General (Family Medicine)  Indicate any recent Medical Services you may have received from other than [facility] providers in the past year (date may be approximate).     Assessment:   This is a routine wellness examination for [place]. Physical assessment deferred to PCP.   Hearing/Vision screen No exam data present  Dietary issues and exercise activities discussed: Current Exercise Habits: Home exercise routine, Type of exercise: walking, Time (Minutes): 45, Intensity: Mild   Diet (meal preparation, eat out, water intake, caffeinated beverages, dairy products, fruits and vegetables): in general, a "healthy" diet  , well balanced, on average, 3 meals per day  Goals    . Increase physical activity      Depression Screen PHQ 2/9 Scores 03/23/2017 01/19/2016 11/16/2015 11/04/2015  PHQ - 2 Score 0 0 0 0    Fall Risk Fall Risk  03/23/2017 11/04/2015  Falls in the past year? No No    Cognitive Function: MMSE - Mini Mental State Exam 03/23/2017  Orientation to time 5  Orientation to Place 5  Registration 3  Attention/ Calculation 2  Recall 2  Language- name 2 objects 2  Language- repeat 1  Language- follow 3 step command 3  Language- read & follow  direction 1  Write a sentence 1  Copy design 1  Total score 26        Screening Tests Health Maintenance  Topic Date Due  . DEXA SCAN  02/10/2001  . PNA vac Low Risk Adult (2 of 2 - PPSV23) 11/03/2016  . INFLUENZA VACCINE  07/05/2017  . TETANUS/TDAP  06/09/2022      Plan:     Follow up with PCP as directed  During the course of the visit, [place] was educated and counseled about the following appropriate screening and preventive services:   Vaccines to include Pneumoccal, Influenza, Td, HCV  Cardiovascular disease screening  Colorectal cancer screening  Bone density screening  Diabetes screening  Glaucoma screening  Mammography/PAP  Nutrition counseling  Patient Instructions (the written plan) were given to the patient.  Shela Nevin, [place]   03/23/2017    I have reviewed the above noted by Ms. Vevelyn Royals and agree with her documentation Denny Peon MD

## 2017-03-23 NOTE — Progress Notes (Signed)
[place] at [facility] [address] [place], [place] [address] [phone]  Date:  03/23/2017   Name:  Kathy Thornton   DOB:  May 30, 1934   MRN:  [id number]  PCP:  Lamar Blinks, MD    Chief Complaint: Follow-up (Pt here for 6 month follow up. Need refill on Tramadol/)   History of Present Illness:  Kathy Thornton is a 83 y.o. very pleasant female patient who presents with the following:  Here today for a recheck visit- last seen by myself in September Here today with [name].  She would also like to do her MWE today with RN staff- we will arrange this for her She had a stomach ulcer last year but this is now resolved.  She has been released by GI and is symptoms free  Lab Results  Component Value Date   WBC 6.9 08/22/2016   HGB 12.1 08/22/2016   HCT 36.8 08/22/2016   MCV 87.3 08/22/2016   PLT 204.0 08/22/2016   BP Readings from Last 3 Encounters:  03/23/17 (!) 162/82  08/22/16 132/82  03/22/16 127/67   Her BP has always been normal she her never been on medication for her BP However she reports that she saw ortho about a month ago and her BP was high at that visit- she thinks SBP was 168 at that visit as well  Her father did have HTN in his older years.   She takes her tramadol about once a day for knee pain, esp when her periodic cortisone shots are wearing off  Otherwise she is feeling fine and has no complaints She has lost a few pounds intentionally and would like to lose a few more Normal appetite, no nausea, vomiting, diarrhea, unusual HA, CP or SOB Patient Active Problem List   Diagnosis Date Noted  . [name] lesion, acute: Per EGD 11/06/2015 11/06/2015  . Erosive gastritis: per EGD 11/05/2105 11/06/2015  . Hemorrhoids; Grade 1 per colonoscopy 11/06/2015 11/06/2015  . Anemia, iron deficiency   . Acute gastric ulcer   . Anemia 11/05/2015  . Symptomatic anemia 11/05/2015  . Heart murmur  11/05/2015  . UTI (lower urinary tract infection) 11/05/2015  . Lower extremity edema 11/05/2015  . Widened pulse pressure 11/05/2015  . Absolute anemia   . Arthritis of both knees 11/04/2015    Past Medical History:  Diagnosis Date  . [name] lesion, acute: Per EGD 11/06/2015 11/06/2015  . Cataract   . Erosive gastritis: per EGD 11/05/2105 11/06/2015  . Hemorrhoids; Grade 1 per colonoscopy 11/06/2015 11/06/2015  . Knee pain     Past Surgical History:  Procedure Laterality Date  . COLONOSCOPY WITH PROPOFOL N/A 11/06/2015   Procedure: COLONOSCOPY WITH PROPOFOL;  Surgeon: Ladene Artist, MD;  Location: [facility];  Service: Endoscopy;  Laterality: N/A;  . ESOPHAGOGASTRODUODENOSCOPY (EGD) WITH PROPOFOL N/A 11/06/2015   Procedure: ESOPHAGOGASTRODUODENOSCOPY (EGD) WITH PROPOFOL;  Surgeon: Ladene Artist, MD;  Location: [facility];  Service: Endoscopy;  Laterality: N/A;  . EYE SURGERY      Social History  Substance Use Topics  . Smoking status: Never Smoker  . Smokeless tobacco: Not on file  . Alcohol use No    Family History  Problem Relation Age of Onset  . Heart disease Father   . Heart disease Brother     Allergies  Allergen Reactions  . Sulfa Antibiotics Hives    Medication list has been reviewed and updated.  Current Outpatient Prescriptions on File Prior to Visit  Medication Sig Dispense Refill  . pantoprazole (PROTONIX) 40 MG tablet TAKE 1 TABLET(40 MG) BY MOUTH DAILY 90 tablet 0  . PRESCRIPTION MEDICATION Cortisone injections in both knees    . traMADol (ULTRAM) 50 MG tablet TAKE 1 TABLET BY MOUTH EVERY 8 HOURS AS NEEDED FOR MODERATE PAIN 30 tablet 1   No current facility-administered medications on file prior to visit.     Review of Systems:  As per HPI- otherwise negative. Wt Readings from Last 3 Encounters:  03/23/17 148 lb (67.1 kg)  08/22/16 155 lb 6.4 oz (70.5 kg)  03/22/16 160 lb (72.6 kg)    Physical Examination: Vitals:   03/23/17 1534  BP:  (!) 178/82  Pulse: 70  Temp: 97.7 F (36.5 C)   Vitals:   03/23/17 1534  Weight: 148 lb (67.1 kg)   Body mass index is 28.9 kg/m. Ideal Body Weight:    GEN: WDWN, NAD, Non-toxic, A & O x 3, looks well and younger than age 1: Atraumatic, Normocephalic. Neck supple. No masses, No LAD. Ears and Nose: No external deformity. CV: RRR, No M/G/R. No JVD. No thrill. No extra heart sounds. PULM: CTA B, no wheezes, crackles, rhonchi. No retractions. No resp. distress. No accessory muscle use. ABD: S, NT, ND, +BS. No rebound. No HSM. EXTR: No c/c/e NEURO Normal gait.  PSYCH: Normally interactive. Conversant. Not depressed or anxious appearing.  Calm demeanor.    Assessment and Plan: Primary osteoarthritis of both knees - Plan: traMADol (ULTRAM) 50 MG tablet  Elevated BP without diagnosis of hypertension  History of gastric ulcer  Here today for a periodic recheck and MWE Refilled tramadol for prn use for her joint pains- she avoids NSAIDs since her ulcer They will monitor her BP and will keep me posted; for the time being she declines to start medication She does not have the date but feels pretty sure that she got her pneumovax in years past  I have reviewed the note by RN staff today and agree with documentation  Signed Lamar Blinks, MD

## 2017-03-23 NOTE — Progress Notes (Signed)
Pre visit review using our clinic review tool, if applicable. No additional management support is needed unless otherwise documented below in the visit note. 

## 2017-03-23 NOTE — Patient Instructions (Addendum)
It was very nice to see you today!  You are looking great- keep up the good work  Your BP is higher than usual today.  Please check this at home a few times over the next several weeks.  If you are often running higher than 140/90 please alert me Otherwise let's visit in September for your labs  You might also consider getting the Shingrix vaccine- new shingles shot, at your drug store

## 2017-04-14 ENCOUNTER — Other Ambulatory Visit: Payer: Self-pay | Admitting: Family Medicine

## 2017-06-26 DIAGNOSIS — M170 Bilateral primary osteoarthritis of knee: Secondary | ICD-10-CM | POA: Diagnosis not present

## 2017-07-12 ENCOUNTER — Other Ambulatory Visit: Payer: Self-pay | Admitting: Family Medicine

## 2017-07-25 ENCOUNTER — Other Ambulatory Visit: Payer: Self-pay | Admitting: Family Medicine

## 2017-07-25 ENCOUNTER — Telehealth: Payer: Self-pay | Admitting: Family Medicine

## 2017-07-25 DIAGNOSIS — M170 Bilateral primary osteoarthritis of knee: Secondary | ICD-10-CM

## 2017-07-25 MED ORDER — TRAMADOL HCL 50 MG PO TABS: ORAL_TABLET | ORAL | 3 refills | Status: DC

## 2017-07-25 NOTE — Telephone Encounter (Signed)
Caller name:Weichert, Melia Hopes Relation to QH:[id number]  Call back number: [phone]   Reason for call:  Daughter requesting a refill traMADol (ULTRAM) 50 MG tablet

## 2017-07-25 NOTE — Telephone Encounter (Signed)
Pt is requesting refill on Tramadol 50mg . Please advise.

## 2017-09-11 DIAGNOSIS — M170 Bilateral primary osteoarthritis of knee: Secondary | ICD-10-CM | POA: Diagnosis not present

## 2017-10-02 NOTE — Progress Notes (Addendum)
[place] at [facility] [address] [place], [place] [address] [phone]  Date:  10/04/2017   Name:  Kathy Thornton   DOB:  [birth year]   MRN:  [id number]  PCP:  Darreld Mclean, MD    Chief Complaint: Follow-up (Pt here for 6 month f/u visit. Fu vaccine given today. )   History of Present Illness:  Kathy Thornton is a 83 y.o. very pleasant female patient who presents with the following:  Here today for a 6 month follow-up visit We have been watching her BP-   Here today for a recheck visit- last seen by myself in September Here today with her daughter [name].  She would also like to do her MWE today with RN staff- we will arrange this for her She had a stomach ulcer last year but this is now resolved.  She has been released by GI and is symptoms free  Recent Labs       Lab Results  Component Value Date   WBC 6.9 08/22/2016   HGB 12.1 08/22/2016   HCT 36.8 08/22/2016   MCV 87.3 08/22/2016   PLT 204.0 08/22/2016        BP Readings from Last 3 Encounters:  03/23/17 (!) 162/82  08/22/16 132/82  03/22/16 127/67   Her BP has always been normal she her never been on medication for her BP However she reports that she saw ortho about a month ago and her BP was high at that visit- she thinks SBP was 168 at that visit as well  Her father did have HTN in his older years.   She takes her tramadol about once a day for knee pain, esp when her periodic cortisone shots are wearing off  Otherwise she is feeling fine and has no complaints She has lost a few pounds intentionally and would like to lose a few more Normal appetite, no nausea, vomiting, diarrhea, unusual HA, CP or SOB  Flu: done today Pnuemovax: will do today Dexa: will set up - Wednesday pm is the best day Mammo: will set up Labs- due today, she is fasting today  She got shots in her bilateral knees about 3 weeks for her knee pain er [facility]  ortho She is using tramadol about once a day, rarely she will take 2 in a day if she is walking a whole lot She has one more refill on her current rx  Wt Readings from Last 3 Encounters:  10/04/17 146 lb 6.4 oz (66.4 kg)  03/23/17 148 lb (67.1 kg)  08/22/16 155 lb 6.4 oz (70.5 kg)   She has lost a bit of weight but has been watching her diet and trying to lose  She is monitoring her BP at home- will most often run 130/65-75, and can go down to 120/60s sometimes as well  Here today with her daughter [name] who contributes to the history   Patient Active Problem List   Diagnosis Date Noted  . [name] lesion, acute: Per EGD 11/06/2015 11/06/2015  . Erosive gastritis: per EGD 11/05/2105 11/06/2015  . Hemorrhoids; Grade 1 per colonoscopy 11/06/2015 11/06/2015  . Anemia, iron deficiency   . Acute gastric ulcer   . Anemia 11/05/2015  . Symptomatic anemia 11/05/2015  . Heart murmur 11/05/2015  . UTI (lower urinary tract infection) 11/05/2015  . Lower extremity edema 11/05/2015  . Arthritis of both knees 11/04/2015    Past Medical History:  Diagnosis  Date  . [name] lesion, acute: Per EGD 11/06/2015 11/06/2015  . Cataract   . Erosive gastritis: per EGD 11/05/2105 11/06/2015  . Hemorrhoids; Grade 1 per colonoscopy 11/06/2015 11/06/2015  . Knee pain     Past Surgical History:  Procedure Laterality Date  . COLONOSCOPY WITH PROPOFOL N/A 11/06/2015   Procedure: COLONOSCOPY WITH PROPOFOL;  Surgeon: Ladene Artist, MD;  Location: [facility];  Service: Endoscopy;  Laterality: N/A;  . ESOPHAGOGASTRODUODENOSCOPY (EGD) WITH PROPOFOL N/A 11/06/2015   Procedure: ESOPHAGOGASTRODUODENOSCOPY (EGD) WITH PROPOFOL;  Surgeon: Ladene Artist, MD;  Location: [facility];  Service: Endoscopy;  Laterality: N/A;  . EYE SURGERY      Social History  Substance Use Topics  . Smoking status: Never Smoker  . Smokeless tobacco: Never Used  . Alcohol use No    Family History  Problem Relation Age of Onset   . Heart disease Father   . Heart disease Brother     Allergies  Allergen Reactions  . Sulfa Antibiotics Hives    Medication list has been reviewed and updated.  Current Outpatient Prescriptions on File Prior to Visit  Medication Sig Dispense Refill  . pantoprazole (PROTONIX) 40 MG tablet TAKE 1 TABLET(40 MG) BY MOUTH DAILY 90 tablet 0  . PRESCRIPTION MEDICATION Cortisone injections in both knees    . traMADol (ULTRAM) 50 MG tablet TAKE 1 TABLET BY MOUTH EVERY 8 HOURS AS NEEDED FOR MODERATE PAIN 30 tablet 3  . vitamin B-12 (CYANOCOBALAMIN) 500 MCG tablet Take 1,000 mcg by mouth daily.    Marland Kitchen VITAMIN D, CHOLECALCIFEROL, PO Take 500 mg by mouth daily.     No current facility-administered medications on file prior to visit.     Review of Systems:  As per HPI- otherwise negative. No fever or chills No CP or SOB  Physical Examination: Vitals:   10/04/17 1232 10/04/17 1242  BP: (!) 146/88 (!) 142/82  Pulse: 72   Temp: 97.8 F (36.6 C)   SpO2: 98%    Vitals:   10/04/17 1232  Weight: 146 lb 6.4 oz (66.4 kg)  Height: 5' (1.524 m)   Body mass index is 28.59 kg/m. Ideal Body Weight: Weight in (lb) to have BMI = 25: 127.7  GEN: WDWN, NAD, Non-toxic, A & O x 3, looks well, mild overweight  HEENT: Atraumatic, Normocephalic. Neck supple. No masses, No LAD.  Bilateral TM wnl, oropharynx normal.  PEERL,EOMI.   Ears and Nose: No external deformity. CV: RRR, No M/G/R. No JVD. No thrill. No extra heart sounds. PULM: CTA B, no wheezes, crackles, rhonchi. No retractions. No resp. distress. No accessory muscle use. ABD: S, NT, ND, +BS. No rebound. No HSM. EXTR: No c/c/e NEURO Normal gait.  PSYCH: Normally interactive. Conversant. Not depressed or anxious appearing.  Calm demeanor.    Assessment and Plan: Screening for diabetes mellitus - Plan: Comprehensive metabolic panel  Screening for hyperlipidemia - Plan: Lipid panel  History of gastric ulcer - Plan: CBC, pantoprazole  (PROTONIX) 40 MG tablet  Immunization due - Plan: Flu vaccine HIGH DOSE PF (Fluzone High dose), Pneumococcal polysaccharide vaccine 23-valent greater than or equal to 2yo subcutaneous/IM  Vitamin D deficiency - Plan: Vitamin D, 25-hydroxy  Vitamin B12 deficiency - Plan: Vitamin B12  Screening for breast cancer - Plan: MM SCREENING BREAST TOMO BILATERAL  Estrogen deficiency - Plan: DG Bone Density  Here today for a follow-up visit Will obtain labs today Ordered dexa and mammo Pneumovax 23 and flu shot today Will plan further  follow- up pending labs. They will continue to monitor her BP at home.  At this time will not start BP medication Refilled her protonix today for history of bleeding ulcer Plan to visit again in about 6 months   Signed Lamar Blinks, MD  Received her labs 11/2- all look good, letter to pt  Results for orders placed or performed in visit on 10/04/17  CBC  Result Value Ref Range   WBC 5.4 4.0 - 10.5 K/uL   RBC 4.10 3.87 - 5.11 Mil/uL   Platelets 194.0 150.0 - 400.0 K/uL   Hemoglobin 12.6 12.0 - 15.0 g/dL   HCT 37.7 36.0 - 46.0 %   MCV 92.0 78.0 - 100.0 fl   MCHC 33.4 30.0 - 36.0 g/dL   RDW 15.0 11.5 - 15.5 %  Comprehensive metabolic panel  Result Value Ref Range   Sodium 141 135 - 145 mEq/L   Potassium 4.0 3.5 - 5.1 mEq/L   Chloride 105 96 - 112 mEq/L   CO2 29 19 - 32 mEq/L   Glucose, Bld 85 70 - 99 mg/dL   BUN 13 6 - 23 mg/dL   Creatinine, Ser 0.86 0.40 - 1.20 mg/dL   Total Bilirubin 0.5 0.2 - 1.2 mg/dL   Alkaline Phosphatase 91 39 - 117 U/L   AST 13 0 - 37 U/L   ALT 8 0 - 35 U/L   Total Protein 6.8 6.0 - 8.3 g/dL   Albumin 4.0 3.5 - 5.2 g/dL   Calcium 9.5 8.4 - 10.5 mg/dL   GFR 67.20 >60.00 mL/min  Lipid panel  Result Value Ref Range   Cholesterol 142 0 - 200 mg/dL   Triglycerides 58.0 0.0 - 149.0 mg/dL   HDL 64.60 >39.00 mg/dL   VLDL 11.6 0.0 - 40.0 mg/dL   LDL Cholesterol 66 0 - 99 mg/dL   Total CHOL/HDL Ratio 2    NonHDL 77.54    Vitamin B12  Result Value Ref Range   Vitamin B-12 635 211 - 911 pg/mL  Vitamin D, 25-hydroxy  Result Value Ref Range   VITD 29.27 (L) 30.00 - 100.00 ng/mL

## 2017-10-04 ENCOUNTER — Ambulatory Visit (INDEPENDENT_AMBULATORY_CARE_PROVIDER_SITE_OTHER): Payer: Medicare Other | Admitting: Family Medicine

## 2017-10-04 ENCOUNTER — Ambulatory Visit: Payer: Self-pay | Admitting: *Deleted

## 2017-10-04 VITALS — BP 142/82 | HR 72 | Temp 97.8°F | Ht 60.0 in | Wt 146.4 lb

## 2017-10-04 DIAGNOSIS — E559 Vitamin D deficiency, unspecified: Secondary | ICD-10-CM | POA: Diagnosis not present

## 2017-10-04 DIAGNOSIS — Z131 Encounter for screening for diabetes mellitus: Secondary | ICD-10-CM

## 2017-10-04 DIAGNOSIS — Z23 Encounter for immunization: Secondary | ICD-10-CM

## 2017-10-04 DIAGNOSIS — Z8719 Personal history of other diseases of the digestive system: Secondary | ICD-10-CM

## 2017-10-04 DIAGNOSIS — E2839 Other primary ovarian failure: Secondary | ICD-10-CM

## 2017-10-04 DIAGNOSIS — Z1231 Encounter for screening mammogram for malignant neoplasm of breast: Secondary | ICD-10-CM | POA: Diagnosis not present

## 2017-10-04 DIAGNOSIS — Z1239 Encounter for other screening for malignant neoplasm of breast: Secondary | ICD-10-CM

## 2017-10-04 DIAGNOSIS — Z13220 Encounter for screening for lipoid disorders: Secondary | ICD-10-CM

## 2017-10-04 DIAGNOSIS — Z8711 Personal history of peptic ulcer disease: Secondary | ICD-10-CM

## 2017-10-04 DIAGNOSIS — E538 Deficiency of other specified B group vitamins: Secondary | ICD-10-CM | POA: Diagnosis not present

## 2017-10-04 LAB — COMPREHENSIVE METABOLIC PANEL
ALBUMIN: 4 g/dL (ref 3.5–5.2)
ALK PHOS: 91 U/L (ref 39–117)
ALT: 8 U/L (ref 0–35)
AST: 13 U/L (ref 0–37)
BUN: 13 mg/dL (ref 6–23)
CO2: 29 mEq/L (ref 19–32)
CREATININE: 0.86 mg/dL (ref 0.40–1.20)
Calcium: 9.5 mg/dL (ref 8.4–10.5)
Chloride: 105 mEq/L (ref 96–112)
GFR: 67.2 mL/min (ref >60.00)
GLUCOSE: 85 mg/dL (ref 70–99)
POTASSIUM: 4 meq/L (ref 3.5–5.1)
Sodium: 141 mEq/L (ref 135–145)
TOTAL PROTEIN: 6.8 g/dL (ref 6.0–8.3)
Total Bilirubin: 0.5 mg/dL (ref 0.2–1.2)

## 2017-10-04 LAB — LIPID PANEL
Cholesterol: 142 mg/dL (ref 0–200)
HDL: 64.6 mg/dL (ref >39.00)
LDL CALC: 66 mg/dL (ref 0–99)
NONHDL: 77.54
Total CHOL/HDL Ratio: 2
Triglycerides: 58 mg/dL (ref 0.0–149.0)
VLDL: 11.6 mg/dL (ref 0.0–40.0)

## 2017-10-04 LAB — CBC
HEMATOCRIT: 37.7 % (ref 36.0–46.0)
Hemoglobin: 12.6 g/dL (ref 12.0–15.0)
MCHC: 33.4 g/dL (ref 30.0–36.0)
MCV: 92 fl (ref 78.0–100.0)
Platelets: 194 10*3/uL (ref 150.0–400.0)
RBC: 4.1 Mil/uL (ref 3.87–5.11)
RDW: 15 % (ref 11.5–15.5)
WBC: 5.4 10*3/uL (ref 4.0–10.5)

## 2017-10-04 LAB — VITAMIN D 25 HYDROXY (VIT D DEFICIENCY, FRACTURES): VITD: 29.27 ng/mL — AB (ref 30.00–100.00)

## 2017-10-04 LAB — VITAMIN B12: VITAMIN B 12: 635 pg/mL (ref 211–911)

## 2017-10-04 MED ORDER — PANTOPRAZOLE SODIUM 40 MG PO TBEC: DELAYED_RELEASE_TABLET | ORAL | 3 refills | Status: DC

## 2017-10-04 NOTE — Patient Instructions (Signed)
It was very nice to see you today!  Continue to watch your BP at home - if you start running higher than 140/90 on a regular basis please do let me know  I will be in touch with your labs You got your flu shot and pneumonia booster today We will set you up for bone density and mammogram  Let's plan to visit in about 6 months

## 2017-10-23 DIAGNOSIS — M25551 Pain in right hip: Secondary | ICD-10-CM | POA: Diagnosis not present

## 2017-10-30 ENCOUNTER — Ambulatory Visit (HOSPITAL_BASED_OUTPATIENT_CLINIC_OR_DEPARTMENT_OTHER)
Admission: RE | Admit: 2017-10-30 | Discharge: 2017-10-30 | Disposition: A | Discharge status: Home / Self Care | Payer: Medicare Other | Source: Ambulatory Visit | Attending: Family Medicine | Admitting: Family Medicine

## 2017-10-30 ENCOUNTER — Encounter (HOSPITAL_BASED_OUTPATIENT_CLINIC_OR_DEPARTMENT_OTHER): Payer: Self-pay

## 2017-10-30 DIAGNOSIS — Z780 Asymptomatic menopausal state: Secondary | ICD-10-CM | POA: Diagnosis not present

## 2017-10-30 DIAGNOSIS — Z1239 Encounter for other screening for malignant neoplasm of breast: Secondary | ICD-10-CM

## 2017-10-30 DIAGNOSIS — R928 Other abnormal and inconclusive findings on diagnostic imaging of breast: Secondary | ICD-10-CM | POA: Diagnosis not present

## 2017-10-30 DIAGNOSIS — Z1231 Encounter for screening mammogram for malignant neoplasm of breast: Secondary | ICD-10-CM | POA: Diagnosis not present

## 2017-10-30 DIAGNOSIS — E2839 Other primary ovarian failure: Secondary | ICD-10-CM | POA: Insufficient documentation

## 2017-10-30 DIAGNOSIS — M810 Age-related osteoporosis without current pathological fracture: Secondary | ICD-10-CM | POA: Insufficient documentation

## 2017-10-31 ENCOUNTER — Telehealth: Payer: Self-pay | Admitting: Family Medicine

## 2017-10-31 ENCOUNTER — Other Ambulatory Visit: Payer: Self-pay | Admitting: Family Medicine

## 2017-10-31 DIAGNOSIS — R928 Other abnormal and inconclusive findings on diagnostic imaging of breast: Secondary | ICD-10-CM

## 2017-10-31 DIAGNOSIS — M810 Age-related osteoporosis without current pathological fracture: Secondary | ICD-10-CM

## 2017-10-31 MED ORDER — ALENDRONATE SODIUM 70 MG PO TABS
70.0000 mg | ORAL_TABLET | ORAL | 11 refills | Status: DC
Start: 2017-10-31 — End: 2018-11-08

## 2017-10-31 NOTE — Telephone Encounter (Signed)
Called to discuss her dexa with her daughter [name] and with pt  She does have osteoporosis.  Would suggest that we start medication, and Tari is ok with this.  She does not have any difficulty swallowing and is able to stay upright for an hour after a weekly dose of fosamax .   Will rx this medication for her, and plan for repeat Dexa in 2 years  ASSESSMENT: The BMD measured at Femur Total Right is 0.693 g/cm2 with a T-score of -2.5.  This patient is considered OSTEOPOROTIC according to [facility]) criteria.

## 2017-11-17 ENCOUNTER — Other Ambulatory Visit: Payer: Self-pay | Admitting: Family Medicine

## 2017-11-17 DIAGNOSIS — M170 Bilateral primary osteoarthritis of knee: Secondary | ICD-10-CM

## 2017-11-17 NOTE — Telephone Encounter (Signed)
Pt is requesting refill on tramadol 50mg .

## 2017-11-17 NOTE — Telephone Encounter (Signed)
Copied from [place]. Topic: Quick Communication - Rx Refill/Question >> Nov 17, 2017  1:39 PM Antonieta Iba C wrote: Pt's daughter [name])  called in to request a refill on traMADol Veatrice Bourbon) 50    Agent: Please be advised that RX refills may take up to 3 business days. We ask that you follow-up with your pharmacy.

## 2017-12-05 DIAGNOSIS — C50919 Malignant neoplasm of unspecified site of unspecified female breast: Secondary | ICD-10-CM

## 2017-12-05 HISTORY — DX: Malignant neoplasm of unspecified site of unspecified female breast: C50.919

## 2017-12-10 ENCOUNTER — Telehealth: Payer: Self-pay | Admitting: Family Medicine

## 2017-12-10 NOTE — Telephone Encounter (Signed)
Called and LMOM with both pt and her daughter [name].  The reason she has to go to the breast center is because only screening is available at the medcenter- they can't do the diagnostic imaging and [place] she needs  Of course always up to her, but I would encourage her to have this follow-up imaging done asap

## 2017-12-10 NOTE — Telephone Encounter (Signed)
-----   Message from Katha Hamming sent at 12/06/2017  2:36 PM EST ----- Regarding: mammogram and ultrasound Kathy Thornton called our department today and I Informed her that she would have to go to the [place] for her diagnostic mammogram and ultrasound.  She has requested that you please call and talk to her before she has this imaging done.    Thanks, Hoyle Sauer [facility]

## 2017-12-19 ENCOUNTER — Other Ambulatory Visit: Payer: Self-pay | Admitting: Family Medicine

## 2017-12-19 ENCOUNTER — Ambulatory Visit
Admission: RE | Admit: 2017-12-19 | Discharge: 2017-12-19 | Disposition: A | Discharge status: Home / Self Care | Payer: Medicare Other | Source: Ambulatory Visit | Attending: Family Medicine | Admitting: Family Medicine

## 2017-12-19 DIAGNOSIS — R928 Other abnormal and inconclusive findings on diagnostic imaging of breast: Secondary | ICD-10-CM

## 2017-12-19 DIAGNOSIS — N6320 Unspecified lump in the left breast, unspecified quadrant: Secondary | ICD-10-CM

## 2017-12-19 DIAGNOSIS — N6322 Unspecified lump in the left breast, upper inner quadrant: Secondary | ICD-10-CM | POA: Diagnosis not present

## 2017-12-22 ENCOUNTER — Ambulatory Visit
Admission: RE | Admit: 2017-12-22 | Discharge: 2017-12-22 | Disposition: A | Discharge status: Home / Self Care | Payer: Medicare Other | Source: Ambulatory Visit | Attending: Family Medicine | Admitting: Family Medicine

## 2017-12-22 ENCOUNTER — Other Ambulatory Visit: Payer: Self-pay | Admitting: Family Medicine

## 2017-12-22 DIAGNOSIS — C50212 Malignant neoplasm of upper-inner quadrant of left female breast: Secondary | ICD-10-CM | POA: Diagnosis not present

## 2017-12-22 DIAGNOSIS — N6320 Unspecified lump in the left breast, unspecified quadrant: Secondary | ICD-10-CM

## 2017-12-22 DIAGNOSIS — N6322 Unspecified lump in the left breast, upper inner quadrant: Secondary | ICD-10-CM | POA: Diagnosis not present

## 2017-12-25 ENCOUNTER — Telehealth: Payer: Self-pay | Admitting: Family Medicine

## 2017-12-25 DIAGNOSIS — D0512 Intraductal carcinoma in situ of left breast: Secondary | ICD-10-CM

## 2017-12-25 NOTE — Telephone Encounter (Signed)
Received a call from the breast center- Shanece does have DCIS.  She has declined a surgical referral as she does not feel up to surgery at her age. Called her up to discuss.  She understands cancer dx, is not interested in surgery at this time.  However, she is interested in consulting with a medial oncologist to discuss her other options for treatment. I will set this up for her and she will let me know if she does not hear anything about this referral appt

## 2017-12-26 ENCOUNTER — Encounter: Payer: Self-pay | Admitting: Oncology

## 2017-12-26 ENCOUNTER — Telehealth: Payer: Self-pay | Admitting: Oncology

## 2017-12-26 NOTE — Telephone Encounter (Signed)
Appt has been scheduled for the pt to see Dr. Jana Hakim on 1/29 at 430pm. Pt aware to arrive 30 minutes early. Letter mailed to the pt and referring notified.

## 2017-12-27 ENCOUNTER — Telehealth: Payer: Self-pay | Admitting: *Deleted

## 2017-12-27 NOTE — Telephone Encounter (Signed)
Received Dermatopathology Report results from [facility]; forwarded to provider/SLS 01/23     

## 2017-12-28 ENCOUNTER — Telehealth: Payer: Self-pay | Admitting: *Deleted

## 2017-12-28 NOTE — Telephone Encounter (Signed)
Received Dermatopathology Report results from [facility]; forwarded to provider/SLS 01/24

## 2017-12-29 NOTE — Progress Notes (Signed)
[facility]  [phone] [phone]     ID: SAKIRA DAHMER DOB: [birth year]  MR#: [id number]  KTG#:[id number]  Patient Care Team: Darreld Mclean, MD as PCP - General (Family Medicine) Earlie Server, MD as Consulting Physician (Orthopedic Surgery) Gatha Mayer, MD as Consulting Physician (Gastroenterology) Shaune Malacara, Virgie Dad, MD as Consulting Physician (Oncology) OTHER MD:  CHIEF COMPLAINT: Estrogen receptor positive breast cancer  CURRENT TREATMENT: Neoadjuvant anastrozole   HISTORY OF CURRENT ILLNESS:  AUDRINNA SHERMAN had routine screening mammography at [facility] on 10/30/2017 showing a possible abnormality in the left breast. She underwent bilateral diagnostic mammography with tomography and left breast ultrasonography at [facility] on 12/19/2017 showing:  a highly suspicious mass in the left breast at 11 o'clock. No evidence of left axillary lymphadenopathy. Ultrasound targeted to the left breast at 11 o'clock, 5 cm from the nipple demonstrates an ill-defined irregular vascular mass with spiculated margins measuring 8 x 6 x 6 mm. Ultrasound of the left axilla demonstrates multiple normal-appearing lymph nodes.  Accordingly on 12/22/2017 she proceeded to biopsy of the left breast area in question. 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 Prognostic indicators significant for: ER, 90% positive and PR, 80% positive, both with strong staining intensity. Proliferation marker Ki67 at 3%. HER2 negative via [place].  The patient's subsequent history is as detailed below.  INTERVAL HISTORY: [place] evaluated in the breast cancer clinic on 01/02/2018 accompanied by her daughters, 82 Abide, and Patty.   REVIEW OF SYSTEMS: There were no specific symptoms leading to the  original mammogram, which was routinely scheduled. The patient denies unusual headaches, visual changes, nausea, vomiting, stiff neck, dizziness, or gait imbalance. There has been no cough, phlegm production, or pleurisy, no chest pain or pressure, and no change in bowel or bladder habits. The patient denies fever, rash, bleeding, unexplained fatigue or unexplained weight loss. A detailed review of systems was otherwise entirely negative.    PAST MEDICAL HISTORY: Past Medical History:  Diagnosis Date  . [name] lesion, acute: Per EGD 11/06/2015 11/06/2015  . Cataract   . Erosive gastritis: per EGD 11/05/2105 11/06/2015  . Hemorrhoids; Grade 1 per colonoscopy 11/06/2015 11/06/2015  . Knee pain     PAST SURGICAL HISTORY: Past Surgical History:  Procedure Laterality Date  . COLONOSCOPY WITH PROPOFOL N/A 11/06/2015   Procedure: COLONOSCOPY WITH PROPOFOL;  Surgeon: Ladene Artist, MD;  Location: [facility];  Service: Endoscopy;  Laterality: N/A;  . ESOPHAGOGASTRODUODENOSCOPY (EGD) WITH PROPOFOL N/A 11/06/2015   Procedure: ESOPHAGOGASTRODUODENOSCOPY (EGD) WITH PROPOFOL;  Surgeon: Ladene Artist, MD;  Location: [facility];  Service: Endoscopy;  Laterality: N/A;  . EYE SURGERY      FAMILY HISTORY Family History  Problem Relation Age of Onset  . Heart disease Father   . Heart disease Brother   Her mother had a brain aneurysm at age 3 that she passed from. Her father lived with cardiac issues and passed at age 76. She has 2 brothers and 3 sisters. She had one paternal cousin that had breast cancer at age 82. One uncle had a prior hx of colon cancer. She denies any family hx of ovarian cancer.   GYNECOLOGIC HISTORY:  No LMP recorded. Patient is postmenopausal. Menarche: 84 years old Age at first live birth: 85 years old GX P: [id number] LMP: 84 years old Contraceptive:No HRT: No  SOCIAL HISTORY: She retired from [employer], [employer], [employer], and [employer] at age 59. She will have  been married 47 years in August, 2019. Her husband's name is Elenore Rota and he is a Horticulturist, commercial. She lives at home with her husband. She has no pets, but reports that she keeps her granddaughter when the girl is not in school. She has 36 grandchildren and great grandchildren combined. She is a Psychologist, forensic.      ADVANCED DIRECTIVES: She is comfortable with her husband being her healthcare power of attorney   HEALTH MAINTENANCE: Social History   Tobacco Use  . Smoking status: Never Smoker  . Smokeless tobacco: Never Used  Substance Use Topics  . Alcohol use: No  . Drug use: No     Colonoscopy: 11/05/2015/[facility]  PAP: Last one >10 years ago per patient   Bone density: 10/30/2017 showed a T score of -2.5   Allergies  Allergen Reactions  . Sulfa Antibiotics Hives    Current Outpatient Medications  Medication Sig Dispense Refill  . alendronate (FOSAMAX) 70 MG tablet Take 1 tablet (70 mg total) by mouth every 7 (seven) days. Take with a full glass of water on an empty stomach. 4 tablet 11  . CINNAMON PO Take 1 tablet by mouth daily.    . pantoprazole (PROTONIX) 40 MG tablet TAKE 1 TABLET(40 MG) BY MOUTH DAILY 90 tablet 3  . traMADol (ULTRAM) 50 MG tablet TAKE 1 TABLET BY MOUTH EVERY 8 HOURS AS NEEDED FOR MODERATE PAIN 30 tablet 2  . TURMERIC PO Take by mouth.    Marland Kitchen UNABLE TO FIND AM [place] - 1 tablespoon each of raw vinegar and raw honey    . VITAMIN D, CHOLECALCIFEROL, PO Take 500 mg by mouth daily.     No current facility-administered medications for this visit.     OBJECTIVE: Older white woman who appears younger than stated age  84:   01/02/18 1633  BP: (!) 158/81  Pulse: 66  Temp: 97.9 F (36.6 C)  SpO2: 99%     Body mass index is 29.93 kg/m.   Wt Readings from Last 3 Encounters:  01/02/18 143 lb 3.2 oz (65 kg)  10/04/17 146 lb 6.4 oz (66.4 kg)  03/23/17 148 lb (67.1 kg)      ECOG FS:1 - Symptomatic but completely ambulatory  Ocular: Sclerae unicteric,  intact Lymphatic: No cervical or supraclavicular adenopathy Lungs no rales or rhonchi Heart regular rate and rhythm Abd soft, nontender, positive bowel sounds MSK no focal spinal tenderness, no joint edema Neuro: non-focal, well-oriented, appropriate affect Breasts: The right breast is unremarkable.  The left breast is status post recent lumpectomy.  There is a significant ecchymosis.  There is no palpable mass.  Both axillae are benign.   LAB RESULTS:  CMP     Component Value Date/Time   NA 138 01/02/2018 1547   K 4.6 01/02/2018 1547   CL 106 01/02/2018 1547   CO2 26 01/02/2018 1547   GLUCOSE 86 01/02/2018 1547   BUN 15 01/02/2018 1547   CREATININE 0.86 10/04/2017 1348   CREATININE 1.11 (H) 01/19/2016 1126   CALCIUM 8.9 01/02/2018 1547   PROT 7.1 01/02/2018 1547   ALBUMIN 4.1 01/02/2018 1547   AST 14 01/02/2018 1547   ALT 9 01/02/2018 1547   ALKPHOS 112 01/02/2018 1547   BILITOT 0.4 01/02/2018 1547   GFRNONAA >60 01/02/2018 1547   GFRAA >60 01/02/2018 1547    No results found for: TOTALPROTELP,  ALBUMINELP, A1GS, A2GS, BETS, BETA2SER, GAMS, MSPIKE, SPEI  No results found for: KPAFRELGTCHN, LAMBDASER, KAPLAMBRATIO  Lab Results  Component Value Date   WBC 5.8 01/02/2018   NEUTROABS 2.9 01/02/2018   HGB 12.6 10/04/2017   HCT 39.9 01/02/2018   MCV 93.0 01/02/2018   PLT 202 01/02/2018    @LASTCHEMISTRY @  No results found for: LABCA2  No components found for: MVHQIO962  No results for input(s): INR in the last 168 hours.  No results found for: LABCA2  No results found for: XBM841  No results found for: LKG401  No results found for: UUV253  No results found for: CA2729  No components found for: HGQUANT  No results found for: CEA1 / No results found for: CEA1   No results found for: AFPTUMOR  No results found for: [place]  No results found for: PSA1  Appointment on 01/02/2018  Component Date Value Ref Range Status  . Sodium 01/02/2018 138  136  - 145 mmol/L Final  . Potassium 01/02/2018 4.6  3.3 - 4.7 mmol/L Final  . Chloride 01/02/2018 106  98 - 109 mmol/L Final  . CO2 01/02/2018 26  22 - 29 mmol/L Final  . Glucose, Bld 01/02/2018 86  70 - 140 mg/dL Final  . BUN 01/02/2018 15  7 - 26 mg/dL Final  . Creatinine 01/02/2018 0.86  0.60 - 1.10 mg/dL Final  . Calcium 01/02/2018 8.9  8.4 - 10.4 mg/dL Final  . Total Protein 01/02/2018 7.1  6.4 - 8.3 g/dL Final  . Albumin 01/02/2018 4.1  3.5 - 5.0 g/dL Final  . AST 01/02/2018 14  5 - 34 U/L Final  . ALT 01/02/2018 9  0 - 55 U/L Final  . Alkaline Phosphatase 01/02/2018 112  40 - 150 U/L Final  . Total Bilirubin 01/02/2018 0.4  0.2 - 1.2 mg/dL Final  . GFR, Est Non Af Am 01/02/2018 >60  >60 mL/min Final  . GFR, Est AFR Am 01/02/2018 >60  >60 mL/min Final   Comment: (NOTE) The eGFR has been calculated using the CKD EPI equation. This calculation has not been validated in all clinical situations. eGFR's persistently <60 mL/min signify possible Chronic Kidney Disease.   Georgiann Hahn gap 01/02/2018 6  3 - 11 Final   Performed at [facility], [place] [address] [place], [place] [address]  . WBC Count 01/02/2018 5.8  3.9 - 10.3 K/uL Final  . RBC 01/02/2018 4.29  3.70 - 5.45 MIL/uL Final  . Hemoglobin 01/02/2018 13.2  11.6 - 15.9 g/dL Final  . HCT 01/02/2018 39.9  34.8 - 46.6 % Final  . MCV 01/02/2018 93.0  79.5 - 101.0 fL Final  . MCH 01/02/2018 30.8  25.1 - 34.0 pg Final  . MCHC 01/02/2018 33.1  31.5 - 36.0 g/dL Final  . RDW 01/02/2018 14.7  11.2 - 16.1 % Final  . Platelet Count 01/02/2018 202  145 - 400 K/uL Final  . Neutrophils Relative % 01/02/2018 50  % Final  . Neutro Abs 01/02/2018 2.9  1.5 - 6.5 K/uL Final  . Lymphocytes Relative 01/02/2018 40  % Final  . Lymphs Abs 01/02/2018 2.3  0.9 - 3.3 K/uL Final  . Monocytes Relative 01/02/2018 8  % Final  . Monocytes Absolute 01/02/2018 0.5  0.1 - 0.9 K/uL Final  . Eosinophils Relative 01/02/2018 1  % Final  .  Eosinophils Absolute 01/02/2018 0.1  0.0 - 0.5 K/uL Final  . Basophils Relative 01/02/2018 1  % Final  .  Basophils Absolute 01/02/2018 0.1  0.0 - 0.1 K/uL Final   Performed at [facility], [place] [address] [place], [place] [address]    (this displays the last labs from the last 3 days)  No results found for: TOTALPROTELP, ALBUMINELP, A1GS, A2GS, BETS, BETA2SER, GAMS, MSPIKE, SPEI (this displays SPEP labs)  No results found for: KPAFRELGTCHN, LAMBDASER, KAPLAMBRATIO (kappa/lambda light chains)  No results found for: HGBA, HGBA2QUANT, HGBFQUANT, HGBSQUAN (Hemoglobinopathy evaluation)   No results found for: LDH  Lab Results  Component Value Date   IRON 14 (L) 11/05/2015   TIBC 545 (H) 11/05/2015   IRONPCTSAT 3 (L) 11/05/2015   (Iron and TIBC)  Lab Results  Component Value Date   FERRITIN 3 (L) 11/05/2015    Urinalysis    Component Value Date/Time   COLORURINE YELLOW 02/07/2016 1540   APPEARANCEUR CLOUDY (A) 02/07/2016 1540   LABSPEC 1.016 02/07/2016 1540   PHURINE 6.0 02/07/2016 1540   GLUCOSEU NEGATIVE 02/07/2016 1540   HGBUR NEGATIVE 02/07/2016 1540   BILIRUBINUR NEGATIVE 02/07/2016 1540   BILIRUBINUR negative 11/04/2015 1418   KETONESUR 15 (A) 02/07/2016 1540   PROTEINUR NEGATIVE 02/07/2016 1540   UROBILINOGEN 0.2 11/04/2015 1418   NITRITE NEGATIVE 02/07/2016 1540   LEUKOCYTESUR NEGATIVE 02/07/2016 1540     STUDIES: US Breast Ltd Uni Left Inc Axilla  Result Date: 12/19/2017 CLINICAL DATA:  Screening recall for a left breast mass. EXAM: 2D DIGITAL DIAGNOSTIC UNILATERAL LEFT MAMMOGRAM WITH CAD AND ADJUNCT TOMO LEFT BREAST ULTRASOUND COMPARISON:  Previous exam(s). ACR Breast Density Category b: There are scattered areas of fibroglandular density. FINDINGS: In the superior aspect of the left breast, posterior depth there is a new suspicious spiculated mass. Mammographic images were processed with CAD. Ultrasound targeted to the left breast at  11 o'clock, 5 cm from the nipple demonstrates an ill-defined irregular vascular mass with spiculated margins measuring 8 x 6 x 6 mm. Ultrasound of the left axilla demonstrates multiple normal-appearing lymph nodes. IMPRESSION: 1. There is a highly suspicious mass in the left breast at 11 o'clock. 2.  No evidence of left axillary lymphadenopathy. RECOMMENDATION: Ultrasound-guided biopsy is recommended for the left breast mass. The patient stated at her age that she does not wish to undergo surgery. I explained to her that I still recommend the biopsy in order to get a tissue diagnosis which would help her learn if there are additional therapies that may be available to her even in the absence of surgery. The biopsy has been scheduled for 12/22/2017. I have discussed the findings and recommendations with the patient. Results were also provided in writing at the conclusion of the visit. If applicable, a reminder letter will be sent to the patient regarding the next appointment. BI-RADS CATEGORY  5: Highly suggestive of malignancy. Electronically Signed   By: Ammie Ferrier M.D.   On: 12/19/2017 11:38   Mm Diag Breast Tomo Uni Left  Result Date: 12/19/2017 CLINICAL DATA:  Screening recall for a left breast mass. EXAM: 2D DIGITAL DIAGNOSTIC UNILATERAL LEFT MAMMOGRAM WITH CAD AND ADJUNCT TOMO LEFT BREAST ULTRASOUND COMPARISON:  Previous exam(s). ACR Breast Density Category b: There are scattered areas of fibroglandular density. FINDINGS: In the superior aspect of the left breast, posterior depth there is a new suspicious spiculated mass. Mammographic images were processed with CAD. Ultrasound targeted to the left breast at 11 o'clock, 5 cm from the nipple demonstrates an ill-defined irregular vascular mass with spiculated margins measuring 8 x 6 x 6 mm.  Ultrasound of the left axilla demonstrates multiple normal-appearing lymph nodes. IMPRESSION: 1. There is a highly suspicious mass in the left breast at 11 o'clock.  2.  No evidence of left axillary lymphadenopathy. RECOMMENDATION: Ultrasound-guided biopsy is recommended for the left breast mass. The patient stated at her age that she does not wish to undergo surgery. I explained to her that I still recommend the biopsy in order to get a tissue diagnosis which would help her learn if there are additional therapies that may be available to her even in the absence of surgery. The biopsy has been scheduled for 12/22/2017. I have discussed the findings and recommendations with the patient. Results were also provided in writing at the conclusion of the visit. If applicable, a reminder letter will be sent to the patient regarding the next appointment. BI-RADS CATEGORY  5: Highly suggestive of malignancy. Electronically Signed   By: Ammie Ferrier M.D.   On: 12/19/2017 11:38   Mm Clip Placement Left  Result Date: 12/22/2017 CLINICAL DATA:  84 year old female status post ultrasound-guided biopsy of a suspicious left breast mass. EXAM: DIAGNOSTIC LEFT MAMMOGRAM POST ULTRASOUND BIOPSY COMPARISON:  Previous exam(s). FINDINGS: Mammographic images were obtained following ultrasound guided biopsy of a left breast mass. Ribbon shaped clip is identified in the upper outer quadrant posterior depth. This is in the expected location status post ultrasound-guided biopsy. IMPRESSION: Ribbon shaped clip in satisfactory position status post ultrasound-guided biopsy of a left breast mass. Final Assessment: Post Procedure Mammograms for Marker Placement Electronically Signed   By: Kristopher Oppenheim M.D.   On: 12/22/2017 08:24   [place] Lt Breast Bx W Loc Dev 1st Lesion Img Bx Spec US Guide  Addendum Date: 12/25/2017   ADDENDUM REPORT: 12/25/2017 16:36 ADDENDUM: Pathology revealed GRADE I-II INVASIVE DUCTAL CARCINOMA, WELL DIFFERENTIATED, WITH ASSOCIATED FOCAL DUCTAL CARCINOMA IN-SITU DCIS), CRIBRIFORM of the Left breast, 11 o'clock 5 cmfn. This was found to be concordant by Dr. Kristopher Oppenheim.  Pathology results were discussed with the patient by telephone. The patient reported doing well after the biopsy with tenderness at the site. Post biopsy instructions and care were reviewed and questions were answered. The patient was encouraged to call [facility] for any additional concerns. The patient is declining surgical referral at this time. Dr. [name], the patient's PCP was contacted with this information and will reach out to the patient for further discussion regarding referrals. Pathology results reported by Terie Purser, RN on 12/25/2017. Electronically Signed   By: Kristopher Oppenheim M.D.   On: 12/25/2017 16:36   Result Date: 12/25/2017 CLINICAL DATA:  84 year old female with suspicious left breast mass. EXAM: ULTRASOUND GUIDED LEFT BREAST CORE NEEDLE BIOPSY COMPARISON:  Previous exam(s). FINDINGS: I met with the patient and we discussed the procedure of ultrasound-guided biopsy, including benefits and alternatives. We discussed the high likelihood of a successful procedure. We discussed the risks of the procedure, including infection, bleeding, tissue injury, clip migration, and inadequate sampling. Informed written consent was given. The usual time-out protocol was performed immediately prior to the procedure. Lesion quadrant: Upper inner quadrant Using sterile technique and 1% Lidocaine as local anesthetic, under direct ultrasound visualization, a 12 gauge spring-loaded device was used to perform biopsy of left breast mass using a inferior approach. At the conclusion of the procedure a ribbon shaped tissue marker clip was deployed into the biopsy cavity. Follow up 2 view mammogram was performed and dictated separately. IMPRESSION: Ultrasound guided biopsy of a suspicious left breast  mass. No apparent complications. Electronically Signed: By: Kristopher Oppenheim M.D. On: 12/22/2017 08:24    ELIGIBLE FOR AVAILABLE RESEARCH PROTOCOL:no  ASSESSMENT: 84 y.o. [place]  woman status post left breast upper inner quadrant biopsy 12/22/2017 for a clinical T1B N0, stage IA invasive ductal carcinoma, grade 1, estrogen and progesterone receptor positive, HER-2 not amplified, with an MIB-1 of 3%.  (1) genetics testing pending  (2) patient refuses initial surgery  (3) anastrozole started 0129 2019  (a) bone density 10/30/2017 shows a T score of -2.5  (b) alendronate started January 2019  PLAN: We spent the better part of today's hour-long appointment discussing the biology of her diagnosis and the specifics of her situation We first reviewed the fact that cancer is not one disease but more than 100 different diseases and that it is important to keep them separate-- otherwise when friends and relatives discuss their own cancer experiences with Marlene confusion can result. Similarly we explained that if breast cancer spreads to the bone or liver, the patient would not have bone cancer or liver cancer, but breast cancer in the bone and breast cancer in the liver: one cancer in three places-- not 3 different cancers which otherwise would have to be treated in 3 different ways.  We discussed the difference between local and systemic therapy. In terms of loco-regional treatment, lumpectomy plus radiation is equivalent to mastectomy as far as survival is concerned. For this reason, and because the cosmetic results are generally superior, we recommend breast conserving surgery. We also noted that in terms of sequencing of treatments, whether systemic therapy or surgery is done first does not affect the ultimate outcome.  We also discussed radiation treatments and the patient is aware that in women over 5 with small low-grade node-negative tumors who are willing to take antiestrogens for 5 years omitting radiation may be chosen without compromising survival  We then discussed the rationale for systemic therapy. There is some risk that this cancer may have already spread to other  parts of her body. Patients frequently ask at this point about bone scans, CAT scans and PET scans to find out if they have occult breast cancer somewhere else. The problem is that in early stage disease we are much more likely to find false positives then true cancers and this would expose the patient to unnecessary procedures as well as unnecessary radiation. Scans cannot answer the question the patient really would like to know, which is whether she has microscopic disease elsewhere in her body. For those reasons we do not recommend them.  Of course we would proceed to aggressive evaluation of any symptoms that might suggest metastatic disease, but that is not the case here.  Next we went over the options for systemic therapy which are anti-estrogens, anti-HER-2 immunotherapy, and chemotherapy. Zema does not meet criteria for anti-HER-2 immunotherapy. She is a good candidate for anti-estrogens.  The question of chemotherapy is more complicated. Chemotherapy is most effective in rapidly growing, aggressive tumors. It is much less effective in low-grade, slow growing cancers, like Shadiamond 's. For that reason and taking into account the patient's age, and the size and grade of the tumor, I am comfortable bypassing an Oncotype and omitting chemotherapy  Denyse Amass had many misconceptions regarding the treatment she would receive and her prognosis.  Hopefully that was cleared today.  She understands that with standard therapy which in her case would be surgery plus or minus radiation and then antiestrogens, the cure rate is in the 95%  range.  She also understands that without surgery we do not cure breast cancer.  If she only took anastrozole for example I would anticipate an initial response, but at some point the more dangerous and perhaps estrogen receptor negative elements within the tumor would develop and might spread.  This means she would still need surgery at some point, in the future, when she might  not be in as good health as she is now.   After much discussion we made a deal.  We are starting anastrozole now.  She is going to see me in about 6 weeks just to make sure she tolerates it well.  Of course if she does not we will switch her to a different agent.  At that visit also I will set her up to see a surgeon 3 months from now and we will repeat an ultrasound of the breast shortly before that visit  She is agreeable to this plan but she also told me she might change her mind regarding surgery.  In that case she will let me know and I will make the surgery referral as soon as she lets me know  [facility] has a good understanding of the overall plan. She agrees with it. She knows the goal of treatment in her case is cure. She will call with any problems that may develop before her next visit here.  Qiara Minetti, Virgie Dad, MD  01/02/18 5:18 PM Medical Oncology and Hematology [facility] [address] [place], [place] [address] Tel. [phone]    Fax. [phone]    This document serves as a record of services personally performed by Lurline Del, MD. It was created on his behalf by Steva Colder, a trained medical scribe. The creation of this record is based on the scribe's personal observations and the provider's statements to them.   I have reviewed the above documentation for accuracy and completeness, and I agree with the above.

## 2018-01-01 ENCOUNTER — Other Ambulatory Visit: Payer: Self-pay | Admitting: *Deleted

## 2018-01-01 DIAGNOSIS — C50919 Malignant neoplasm of unspecified site of unspecified female breast: Secondary | ICD-10-CM

## 2018-01-02 ENCOUNTER — Telehealth: Payer: Self-pay | Admitting: *Deleted

## 2018-01-02 ENCOUNTER — Inpatient Hospital Stay: Payer: Medicare Other | Attending: Oncology | Admitting: Oncology

## 2018-01-02 ENCOUNTER — Inpatient Hospital Stay: Payer: Medicare Other

## 2018-01-02 DIAGNOSIS — C50212 Malignant neoplasm of upper-inner quadrant of left female breast: Secondary | ICD-10-CM | POA: Diagnosis not present

## 2018-01-02 DIAGNOSIS — Z170 Estrogen receptor positive status [ER+]: Secondary | ICD-10-CM | POA: Diagnosis not present

## 2018-01-02 DIAGNOSIS — M810 Age-related osteoporosis without current pathological fracture: Secondary | ICD-10-CM | POA: Diagnosis not present

## 2018-01-02 DIAGNOSIS — C50919 Malignant neoplasm of unspecified site of unspecified female breast: Secondary | ICD-10-CM

## 2018-01-02 LAB — CBC WITH DIFFERENTIAL (CANCER CENTER ONLY)
BASOS ABS: 0.1 10*3/uL (ref 0.0–0.1)
Basophils Relative: 1 %
EOS ABS: 0.1 10*3/uL (ref 0.0–0.5)
EOS PCT: 1 %
HCT: 39.9 % (ref 34.8–46.6)
Hemoglobin: 13.2 g/dL (ref 11.6–15.9)
LYMPHS PCT: 40 %
Lymphs Abs: 2.3 10*3/uL (ref 0.9–3.3)
MCH: 30.8 pg (ref 25.1–34.0)
MCHC: 33.1 g/dL (ref 31.5–36.0)
MCV: 93 fL (ref 79.5–101.0)
MONO ABS: 0.5 10*3/uL (ref 0.1–0.9)
Monocytes Relative: 8 %
Neutro Abs: 2.9 10*3/uL (ref 1.5–6.5)
Neutrophils Relative %: 50 %
PLATELETS: 202 10*3/uL (ref 145–400)
RBC: 4.29 MIL/uL (ref 3.70–5.45)
RDW: 14.7 % (ref 11.2–16.1)
WBC Count: 5.8 10*3/uL (ref 3.9–10.3)

## 2018-01-02 LAB — CMP (CANCER CENTER ONLY)
ALBUMIN: 4.1 g/dL (ref 3.5–5.0)
ALK PHOS: 112 U/L (ref 40–150)
ALT: 9 U/L (ref 0–55)
AST: 14 U/L (ref 5–34)
Anion gap: 6 (ref 3–11)
BILIRUBIN TOTAL: 0.4 mg/dL (ref 0.2–1.2)
BUN: 15 mg/dL (ref 7–26)
CO2: 26 mmol/L (ref 22–29)
CREATININE: 0.86 mg/dL (ref 0.60–1.10)
Calcium: 8.9 mg/dL (ref 8.4–10.4)
Chloride: 106 mmol/L (ref 98–109)
GFR, Est AFR Am: >60 mL/min (ref >60)
GLUCOSE: 86 mg/dL (ref 70–140)
POTASSIUM: 4.6 mmol/L (ref 3.3–4.7)
Sodium: 138 mmol/L (ref 136–145)
TOTAL PROTEIN: 7.1 g/dL (ref 6.4–8.3)

## 2018-01-02 MED ORDER — ANASTROZOLE 1 MG PO TABS: 1.0000 mg | ORAL_TABLET | Freq: Every day | ORAL | 4 refills | Status: DC

## 2018-01-02 NOTE — Telephone Encounter (Signed)
Received Dermatopathology Report results from [facility]; forwarded to provider/SLS 01/29     

## 2018-01-04 ENCOUNTER — Telehealth: Payer: Self-pay | Admitting: Oncology

## 2018-01-04 NOTE — Telephone Encounter (Signed)
No 1/29 los °

## 2018-01-05 ENCOUNTER — Telehealth: Payer: Self-pay | Admitting: Oncology

## 2018-01-05 NOTE — Telephone Encounter (Signed)
Scheduled appts per 1/29 sch msg - spoke with patient regarding appts.

## 2018-02-20 ENCOUNTER — Other Ambulatory Visit: Payer: Medicare Other

## 2018-02-20 ENCOUNTER — Encounter: Payer: Medicare Other | Admitting: Genetics

## 2018-02-20 ENCOUNTER — Ambulatory Visit: Payer: Medicare Other | Admitting: Oncology

## 2018-03-06 DIAGNOSIS — M170 Bilateral primary osteoarthritis of knee: Secondary | ICD-10-CM | POA: Diagnosis not present

## 2018-03-13 ENCOUNTER — Other Ambulatory Visit: Payer: Self-pay

## 2018-03-13 DIAGNOSIS — C50212 Malignant neoplasm of upper-inner quadrant of left female breast: Secondary | ICD-10-CM

## 2018-03-13 DIAGNOSIS — Z170 Estrogen receptor positive status [ER+]: Principal | ICD-10-CM

## 2018-03-13 NOTE — Progress Notes (Signed)
[facility]  [phone] [phone]     ID: Kathy Thornton DOB: 1934/05/23  MR#: [id number]  LJQ#:[id number]  Patient Care Team: Darreld Mclean, MD as PCP - General (Family Medicine) Earlie Server, MD as Consulting Physician (Orthopedic Surgery) Gatha Mayer, MD as Consulting Physician (Gastroenterology) Livianna Petraglia, Virgie Dad, MD as Consulting Physician (Oncology) Fanny Skates, MD as Consulting Physician (General Surgery) OTHER MD:  CHIEF COMPLAINT: Estrogen receptor positive breast cancer  CURRENT TREATMENT: Neoadjuvant anastrozole; alendronate   HISTORY OF CURRENT ILLNESS:  From the original intake note:  Kathy Thornton had routine screening mammography at Dr. Arlyn Dunning on 10/30/2017 showing a possible abnormality in the left breast. She underwent bilateral diagnostic mammography with tomography and left breast ultrasonography at the [facility] on 12/19/2017 showing:  a highly suspicious mass in the left breast at 11 o'clock. No evidence of left axillary lymphadenopathy. Ultrasound targeted to the left breast at 11 o'clock, 5 cm from the nipple demonstrates an ill-defined irregular vascular mass with spiculated margins measuring 8 x 6 x 6 mm. Ultrasound of the left axilla demonstrates multiple normal-appearing lymph nodes.  Accordingly on 12/22/2017 she proceeded to biopsy of the left breast area in question. 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 Prognostic indicators significant for: ER, 90% positive and PR, 80% positive, both with strong staining intensity. Proliferation marker Ki67 at 3%. HER2 negative via [place].  The patient's subsequent history is as detailed below.  INTERVAL HISTORY: Kathy Thornton returns today for a follow-up of her estrogen receptor positive breast  cancer. She is accompanied by her daughter. She continues on anastrozole, with good tolerance.  She has some hot flashes, particularly at night and these do tend to wake her up.  She is also taking alendronate weekly.  She wonders if this is causing the pain in her knees, which is worse since she started taking this medication.  She understands of course the real reason for the pain in the knees is primarily arthritis.  REVIEW OF SYSTEMS: Kathy Thornton is doing well, overall. She endorses arthralgias, hot flashes and insomnia. Kathy Thornton has been getting cortisone shots to help with her arthritis. She seems to think her bone pain has gotten worse since starting her medication. She stays busy working, doing house work and walking around. She will also exercise in her bed before getting up in the morning. Her hot flashes are the worst at night and will wake her up from sleeping, which might contribute to her having trouble sleeping. She denies unusual headaches, visual changes, nausea, vomiting, or dizziness. There has been no unusual cough, phlegm production, or pleurisy. This been no change in bowel or bladder habits. She denies unexplained fatigue or unexplained weight loss, bleeding, rash, or fever. A detailed review of systems was otherwise noncontributory.    PAST MEDICAL HISTORY: Past Medical History:  Diagnosis Date  . Kathy Thornton, acute: Per EGD 11/06/2015 11/06/2015  . Cataract   . Erosive gastritis: per EGD 11/05/2105 11/06/2015  . Family history of breast cancer   . Family history of colon cancer   . Hemorrhoids; Grade 1 per colonoscopy 11/06/2015 11/06/2015  . Knee pain     PAST SURGICAL HISTORY: Past Surgical History:  Procedure Laterality Date  . COLONOSCOPY WITH PROPOFOL N/A 11/06/2015   Procedure: COLONOSCOPY WITH PROPOFOL;  Surgeon: Ladene Artist, MD;  Location: [facility];  Service: Endoscopy;  Laterality: N/A;  . ESOPHAGOGASTRODUODENOSCOPY (EGD) WITH PROPOFOL N/A 11/06/2015    Procedure: ESOPHAGOGASTRODUODENOSCOPY (EGD) WITH PROPOFOL;  Surgeon: Ladene Artist, MD;  Location: [facility];  Service: Endoscopy;  Laterality: N/A;  . EYE SURGERY      FAMILY HISTORY Family History  Problem Relation Age of Onset  . Aneurysm Mother        brain  . Heart disease Father   . Heart disease Brother   . AAA (abdominal aortic aneurysm) Maternal Uncle   . Colon cancer Paternal Uncle        dx >50  . Breast cancer Cousin        pat cousin, dx in her late 48s  Her mother had a brain aneurysm at age 85 that she passed from. Her father lived with cardiac issues and passed at age 46. She has 2 brothers and 3 sisters. She had one paternal cousin that had breast cancer at age 77. One uncle had a prior hx of colon cancer. She denies any family hx of ovarian cancer.   GYNECOLOGIC HISTORY:  No LMP recorded. Patient is postmenopausal. Menarche: 84 years old Age at first live birth: 84 years old [place] P: [id number] LMP: 84 years old Contraceptive:No HRT: No     SOCIAL HISTORY: She retired from [employer], [employer], [employer], and [employer] at age 89. She will have been married 40 years in August, 2019. Her husband's name is Elenore Rota and he is a Horticulturist, commercial. She lives at home with her husband. She has no pets, but reports that she keeps her granddaughter when the girl is not in school. She has 36 grandchildren and great grandchildren combined. She is a Psychologist, forensic.      ADVANCED DIRECTIVES: She is comfortable with her husband being her healthcare power of attorney   HEALTH MAINTENANCE: Social History   Tobacco Use  . Smoking status: Never Smoker  . Smokeless tobacco: Never Used  Substance Use Topics  . Alcohol use: No  . Drug use: No     Colonoscopy: 11/05/2015/[facility]  PAP: Last one >10 years ago per patient   Bone density: 10/30/2017 showed a T score of -2.5   Allergies  Allergen Reactions  . Sulfa Antibiotics Hives    Current Outpatient Medications  Medication Sig  Dispense Refill  . alendronate (FOSAMAX) 70 MG tablet Take 1 tablet (70 mg total) by mouth every 7 (seven) days. Take with a full glass of water on an empty stomach. 4 tablet 11  . anastrozole (ARIMIDEX) 1 MG tablet Take 1 tablet (1 mg total) by mouth daily. 90 tablet 4  . cholecalciferol (VITAMIN D) 1000 units tablet Take 1 tablet (1,000 Units total) by mouth daily.    Marland Kitchen CINNAMON PO Take 1 tablet by mouth daily.    . pantoprazole (PROTONIX) 40 MG tablet TAKE 1 TABLET(40 MG) BY MOUTH DAILY 90 tablet 3  . traMADol (ULTRAM) 50 MG tablet TAKE 1 TABLET BY MOUTH EVERY 8 HOURS AS NEEDED FOR MODERATE PAIN 30 tablet 2  . TURMERIC PO Take by mouth.    Marland Kitchen UNABLE TO FIND AM [place] - 1 tablespoon each of raw vinegar and raw honey    . VITAMIN D, CHOLECALCIFEROL, PO Take 500 mg by mouth daily.     No current facility-administered medications for this visit.     OBJECTIVE: Elderly white woman who uses bilateral canes to walk  Vitals:   03/14/18 1525  BP: (!) 156/88  Pulse: 67  Resp:  18  Temp: 98 F (36.7 C)  SpO2: 99%     Body mass index is 30.76 kg/m.   Wt Readings from Last 3 Encounters:  03/14/18 147 lb 3.2 oz (66.8 kg)  01/02/18 143 lb 3.2 oz (65 kg)  10/04/17 146 lb 6.4 oz (66.4 kg)      ECOG FS:1 - Symptomatic but completely ambulatory  Sclerae unicteric, EOMs intact Oropharynx clear and moist No cervical or supraclavicular adenopathy Lungs no rales or rhonchi Heart regular rate and rhythm Abd soft, nontender, positive bowel sounds MSK no focal spinal tenderness, no upper extremity lymphedema, significant knee discomfort bilaterally with ambulation Neuro: nonfocal, well oriented, appropriate affect Breasts: The right breast is benign.  I do not palpate a well-defined mass in the left breast.  Both axillae are benign.  LAB RESULTS:  CMP     Component Value Date/Time   NA 137 03/14/2018 1441   K 4.1 03/14/2018 1441   CL 104 03/14/2018 1441   CO2 25 03/14/2018 1441   GLUCOSE  87 03/14/2018 1441   BUN 15 03/14/2018 1441   CREATININE 1.03 03/14/2018 1441   CREATININE 1.11 (H) 01/19/2016 1126   CALCIUM 9.2 03/14/2018 1441   PROT 6.7 03/14/2018 1441   ALBUMIN 3.8 03/14/2018 1441   AST 13 03/14/2018 1441   ALT 10 03/14/2018 1441   ALKPHOS 83 03/14/2018 1441   BILITOT 0.4 03/14/2018 1441   GFRNONAA 49 (L) 03/14/2018 1441   GFRAA 57 (L) 03/14/2018 1441    No results found for: TOTALPROTELP, ALBUMINELP, A1GS, A2GS, BETS, BETA2SER, GAMS, MSPIKE, SPEI  No results found for: KPAFRELGTCHN, LAMBDASER, KAPLAMBRATIO  Lab Results  Component Value Date   WBC 6.9 03/14/2018   NEUTROABS 3.8 03/14/2018   HGB 12.6 10/04/2017   HCT 36.9 03/14/2018   MCV 90.3 03/14/2018   PLT 183 03/14/2018    @LASTCHEMISTRY @  No results found for: LABCA2  No components found for: RXYVOP929  No results for input(s): INR in the last 168 hours.  No results found for: LABCA2  No results found for: WKM628  No results found for: MNO177  No results found for: NHA579  No results found for: CA2729  No components found for: HGQUANT  No results found for: CEA1 / No results found for: CEA1   No results found for: AFPTUMOR  No results found for: CHROMOGRNA  No results found for: PSA1  Appointment on 03/14/2018  Component Date Value Ref Range Status  . Sodium 03/14/2018 137  136 - 145 mmol/L Final  . Potassium 03/14/2018 4.1  3.5 - 5.1 mmol/L Final  . Chloride 03/14/2018 104  98 - 109 mmol/L Final  . CO2 03/14/2018 25  22 - 29 mmol/L Final  . Glucose, Bld 03/14/2018 87  70 - 140 mg/dL Final  . BUN 03/14/2018 15  7 - 26 mg/dL Final  . Creatinine 03/14/2018 1.03  0.60 - 1.10 mg/dL Final  . Calcium 03/14/2018 9.2  8.4 - 10.4 mg/dL Final  . Total Protein 03/14/2018 6.7  6.4 - 8.3 g/dL Final  . Albumin 03/14/2018 3.8  3.5 - 5.0 g/dL Final  . AST 03/14/2018 13  5 - 34 U/L Final  . ALT 03/14/2018 10  0 - 55 U/L Final  . Alkaline Phosphatase 03/14/2018 83  40 - 150 U/L Final    . Total Bilirubin 03/14/2018 0.4  0.2 - 1.2 mg/dL Final  . GFR, Est Non Af Am 03/14/2018 49* >60 mL/min Final  . GFR, Est AFR Am  03/14/2018 57* >60 mL/min Final   Comment: (NOTE) The eGFR has been calculated using the CKD EPI equation. This calculation has not been validated in all clinical situations. eGFR's persistently <60 mL/min signify possible Chronic Kidney Disease.   Georgiann Hahn gap 03/14/2018 8  3 - 11 Final   Performed at [facility], [place] [address], [place], [place] [address]  . WBC Count 03/14/2018 6.9  3.9 - 10.3 K/uL Final  . RBC 03/14/2018 4.09  3.70 - 5.45 MIL/uL Final  . Hemoglobin 03/14/2018 12.4  11.6 - 15.9 g/dL Final  . HCT 03/14/2018 36.9  34.8 - 46.6 % Final  . MCV 03/14/2018 90.3  79.5 - 101.0 fL Final  . MCH 03/14/2018 30.3  25.1 - 34.0 pg Final  . MCHC 03/14/2018 33.6  31.5 - 36.0 g/dL Final  . RDW 03/14/2018 14.0  11.2 - 14.5 % Final  . Platelet Count 03/14/2018 183  145 - 400 K/uL Final  . Neutrophils Relative % 03/14/2018 55  % Final  . Neutro Abs 03/14/2018 3.8  1.5 - 6.5 K/uL Final  . Lymphocytes Relative 03/14/2018 34  % Final  . Lymphs Abs 03/14/2018 2.3  0.9 - 3.3 K/uL Final  . Monocytes Relative 03/14/2018 8  % Final  . Monocytes Absolute 03/14/2018 0.6  0.1 - 0.9 K/uL Final  . Eosinophils Relative 03/14/2018 2  % Final  . Eosinophils Absolute 03/14/2018 0.1  0.0 - 0.5 K/uL Final  . Basophils Relative 03/14/2018 1  % Final  . Basophils Absolute 03/14/2018 0.0  0.0 - 0.1 K/uL Final   Performed at [facility], [place] [address] [place], [place] [address]    (this displays the last labs from the last 3 days)  No results found for: TOTALPROTELP, ALBUMINELP, A1GS, A2GS, BETS, BETA2SER, GAMS, MSPIKE, SPEI (this displays SPEP labs)  No results found for: KPAFRELGTCHN, LAMBDASER, KAPLAMBRATIO (kappa/lambda light chains)  No results found for: HGBA, HGBA2QUANT, HGBFQUANT, HGBSQUAN (Hemoglobinopathy  evaluation)   No results found for: LDH  Lab Results  Component Value Date   IRON 14 (L) 11/05/2015   TIBC 545 (H) 11/05/2015   IRONPCTSAT 3 (L) 11/05/2015   (Iron and TIBC)  Lab Results  Component Value Date   FERRITIN 3 (L) 11/05/2015    Urinalysis    Component Value Date/Time   COLORURINE YELLOW 02/07/2016 1540   APPEARANCEUR CLOUDY (A) 02/07/2016 1540   LABSPEC 1.016 02/07/2016 1540   PHURINE 6.0 02/07/2016 1540   GLUCOSEU NEGATIVE 02/07/2016 1540   HGBUR NEGATIVE 02/07/2016 1540   BILIRUBINUR NEGATIVE 02/07/2016 1540   BILIRUBINUR negative 11/04/2015 1418   KETONESUR 15 (A) 02/07/2016 1540   PROTEINUR NEGATIVE 02/07/2016 1540   UROBILINOGEN 0.2 11/04/2015 1418   NITRITE NEGATIVE 02/07/2016 1540   LEUKOCYTESUR NEGATIVE 02/07/2016 1540     STUDIES: No results found.  ELIGIBLE FOR AVAILABLE RESEARCH PROTOCOL:no  ASSESSMENT: 84 y.o. [place] woman status post left breast upper inner quadrant biopsy 12/22/2017 for a clinical T1b N0, stage IA invasive ductal carcinoma, grade 1, estrogen and progesterone receptor positive, HER-2 not amplified, with an MIB-1 of 3%.  (1) genetics testing considered  (2) patient initially refused surgery or radiation  (3) anastrozole started 01/02/2018  (a) bone density 10/30/2017 shows a T score of -2.5  (b) alendronate started January 2019  (4) definitive surgery pending  PLAN: Kathy Thornton initially decided against surgery, but has since reconsidered.  She has spoken to "many people" about it, and now she  prefers the idea of surgery to the need of having repeated short interval mammography and ultrasonography to make sure the cancer is not growing.  Accordingly I have placed a referral to Dr. Dalbert Batman who can discuss a lumpectomy likely with no sentinel lymph node sampling in this 84 year old woman.  As far as anastrozole she is tolerating that well except for hot flashes.  At this point she does not want to take something like  gabapentin, but she will consider Tylenol PM, which will help her fall asleep if not stay asleep through the night.  She is tolerating the alendronate well and the plan is to continue that as long as she is on anastrozole  We will make a decision regarding adjuvant radiation once we have the final pathology, but likely it will not be needed  She will see me again in approximately 2 months.  The plan at this point is to continue anastrozole postop for a total of 5 years  Emberlyn Burlison, Virgie Dad, MD  03/14/18 5:15 PM Medical Oncology and Hematology [facility] [address] [place], [place] [address] Tel. [phone]    Fax. [phone]    This document serves as a record of services personally performed by Chauncey Cruel, MD. It was created on his behalf by Margit Banda, a trained medical scribe. The creation of this record is based on the scribe's personal observations and the provider's statements to them.   I have reviewed the above documentation for accuracy and completeness, and I agree with the above.

## 2018-03-14 ENCOUNTER — Encounter: Payer: Self-pay | Admitting: Genetic Counselor

## 2018-03-14 ENCOUNTER — Inpatient Hospital Stay: Payer: Medicare Other | Attending: Oncology | Admitting: Genetic Counselor

## 2018-03-14 ENCOUNTER — Inpatient Hospital Stay (HOSPITAL_BASED_OUTPATIENT_CLINIC_OR_DEPARTMENT_OTHER): Payer: Medicare Other | Admitting: Oncology

## 2018-03-14 ENCOUNTER — Inpatient Hospital Stay: Payer: Medicare Other

## 2018-03-14 VITALS — BP 156/88 | HR 67 | Temp 98.0°F | Resp 18 | Ht <= 58 in | Wt 147.2 lb

## 2018-03-14 DIAGNOSIS — Z170 Estrogen receptor positive status [ER+]: Principal | ICD-10-CM

## 2018-03-14 DIAGNOSIS — Z79899 Other long term (current) drug therapy: Secondary | ICD-10-CM

## 2018-03-14 DIAGNOSIS — Z803 Family history of malignant neoplasm of breast: Secondary | ICD-10-CM | POA: Insufficient documentation

## 2018-03-14 DIAGNOSIS — C50212 Malignant neoplasm of upper-inner quadrant of left female breast: Secondary | ICD-10-CM

## 2018-03-14 DIAGNOSIS — Z79811 Long term (current) use of aromatase inhibitors: Secondary | ICD-10-CM | POA: Insufficient documentation

## 2018-03-14 DIAGNOSIS — Z800 Family history of malignant neoplasm of digestive organs: Secondary | ICD-10-CM | POA: Insufficient documentation

## 2018-03-14 DIAGNOSIS — N951 Menopausal and female climacteric states: Secondary | ICD-10-CM | POA: Diagnosis not present

## 2018-03-14 LAB — CBC WITH DIFFERENTIAL (CANCER CENTER ONLY)
Basophils Absolute: 0 10*3/uL (ref 0.0–0.1)
Basophils Relative: 1 %
EOS ABS: 0.1 10*3/uL (ref 0.0–0.5)
EOS PCT: 2 %
HCT: 36.9 % (ref 34.8–46.6)
HEMOGLOBIN: 12.4 g/dL (ref 11.6–15.9)
LYMPHS ABS: 2.3 10*3/uL (ref 0.9–3.3)
Lymphocytes Relative: 34 %
MCH: 30.3 pg (ref 25.1–34.0)
MCHC: 33.6 g/dL (ref 31.5–36.0)
MCV: 90.3 fL (ref 79.5–101.0)
MONOS PCT: 8 %
Monocytes Absolute: 0.6 10*3/uL (ref 0.1–0.9)
NEUTROS PCT: 55 %
Neutro Abs: 3.8 10*3/uL (ref 1.5–6.5)
Platelet Count: 183 10*3/uL (ref 145–400)
RBC: 4.09 MIL/uL (ref 3.70–5.45)
RDW: 14 % (ref 11.2–14.5)
WBC Count: 6.9 10*3/uL (ref 3.9–10.3)

## 2018-03-14 LAB — CMP (CANCER CENTER ONLY)
ALT: 10 U/L (ref 0–55)
AST: 13 U/L (ref 5–34)
Albumin: 3.8 g/dL (ref 3.5–5.0)
Alkaline Phosphatase: 83 U/L (ref 40–150)
Anion gap: 8 (ref 3–11)
BUN: 15 mg/dL (ref 7–26)
CHLORIDE: 104 mmol/L (ref 98–109)
CO2: 25 mmol/L (ref 22–29)
CREATININE: 1.03 mg/dL (ref 0.60–1.10)
Calcium: 9.2 mg/dL (ref 8.4–10.4)
GFR, Est AFR Am: 57 mL/min — ABNORMAL LOW (ref >60)
GFR, Estimated: 49 mL/min — ABNORMAL LOW (ref >60)
Glucose, Bld: 87 mg/dL (ref 70–140)
Potassium: 4.1 mmol/L (ref 3.5–5.1)
SODIUM: 137 mmol/L (ref 136–145)
Total Bilirubin: 0.4 mg/dL (ref 0.2–1.2)
Total Protein: 6.7 g/dL (ref 6.4–8.3)

## 2018-03-14 NOTE — Progress Notes (Signed)
REFERRING PROVIDER: Chauncey Cruel, MD [address] [place], [place] [address]  PRIMARY PROVIDER:  Darreld Mclean, MD  PRIMARY REASON FOR VISIT:  1. Malignant neoplasm of upper-inner quadrant of left breast in female, estrogen receptor positive (Kathy Thornton)   2. Family history of colon cancer   3. Family history of breast cancer      HISTORY OF PRESENT ILLNESS:   Kathy Thornton, a 84 y.o. female, was seen for a [place] cancer genetics consultation at the request of Dr. Jana Hakim due to a personal and family history of cancer.  Kathy Thornton presents to clinic today to discuss the possibility of a hereditary predisposition to cancer, genetic testing, and to further clarify her future cancer risks, as well as potential cancer risks for family members.   In 2019, at the age of 55, Kathy Thornton was diagnosed with invasive ductal carcinoma of the breast.  It is currently being treated with letrozole. She is trying to decide whether she wants a lumpectomy or not.   CANCER HISTORY:   No history exists.     HORMONAL RISK FACTORS:  Menarche was at age 53.  First live birth at age 79.  OCP use for approximately 4 years.  Ovaries intact: yes.  Hysterectomy: no.  Menopausal status: postmenopausal.  HRT use: 0 years. Colonoscopy: yes; normal. Mammogram within the last year: yes. Number of breast biopsies: 1. Up to date with pelvic exams:  no. Any excessive radiation exposure in the past:  no  Past Medical History:  Diagnosis Date  . Kathy Thornton lesion, acute: Per EGD 11/06/2015 11/06/2015  . Cataract   . Erosive gastritis: per EGD 11/05/2105 11/06/2015  . Family history of breast cancer   . Family history of colon cancer   . Hemorrhoids; Grade 1 per colonoscopy 11/06/2015 11/06/2015  . Knee pain     Past Surgical History:  Procedure Laterality Date  . COLONOSCOPY WITH PROPOFOL N/A 11/06/2015   Procedure: COLONOSCOPY WITH PROPOFOL;  Surgeon: Ladene Artist, MD;  Location[facility];   Service: Endoscopy;  Laterality: N/A;  . ESOPHAGOGASTRODUODENOSCOPY (EGD) WITH PROPOFOL N/A 11/06/2015   Procedure: ESOPHAGOGASTRODUODENOSCOPY (EGD) WITH PROPOFOL;  Surgeon: Ladene Artist, MD;  [facility];  Service: Endoscopy;  Laterality: N/A;  . EYE SURGERY      Social History   Socioeconomic History  . Marital status: Married    Spouse name: Not on file  . Number of children: Not on file  . Years of education: Not on file  . Highest education level: Not on file  Occupational History  . Not on file  Social Needs  . Financial resource strain: Not on file  . Food insecurity:    Worry: Not on file    Inability: Not on file  . Transportation needs:    Medical: Not on file    Non-medical: Not on file  Tobacco Use  . Smoking status: Never Smoker  . Smokeless tobacco: Never Used  Substance and Sexual Activity  . Alcohol use: No  . Drug use: No  . Sexual activity: Not on file  Lifestyle  . Physical activity:    Days per week: Not on file    Minutes per session: Not on file  . Stress: Not on file  Relationships  . Social connections:    Talks on phone: Not on file    Gets together: Not on file    Attends religious service: Not on file    Active member of club  or organization: Not on file    Attends meetings of clubs or organizations: Not on file    Relationship status: Not on file  Other Topics Concern  . Not on file  Social History Narrative  . Not on file     FAMILY HISTORY:  We obtained a detailed, 4-generation family history.  Significant diagnoses are listed below: Family History  Problem Relation Age of Onset  . Aneurysm Mother        brain  . Heart disease Father   . Heart disease Brother   . AAA (abdominal aortic aneurysm) Maternal Uncle   . Colon cancer Paternal Uncle        dx >50  . Breast cancer Cousin        pat cousin, dx in her late 5s    The patient has nine children.  One daughter died at 40 months from an accident.  The  patient has two brothers and four sisters.  None had cancer.  Both parents are deceased.  The patient's mother died of a brain aneurysm.  She had one brother who had an AAA.  Both parents died of non cancer related issues.  The patient's father was one of nine children, and his father remarried and 6-7 more.  One full brother had colon cancer over age 73 and his daughter had breast cancer in her 53's.  Kathy Thornton is unaware of previous family history of genetic testing for hereditary cancer risks. Patient's maternal ancestors are of Caucasian descent, and paternal ancestors are of Caucasian descent. There is no reported Ashkenazi Jewish ancestry. There is no known consanguinity.  GENETIC COUNSELING ASSESSMENT: Kathy Thornton is a 84 y.o. female with a personal and family history of breast cancer which is somewhat suggestive of a sporadic disease, however she still meets criteria and is interested in testing to determine her predisposition to cancer. We, therefore, discussed and recommended the following at today's visit.   DISCUSSION: We discussed that about 5-10% of breast cancer is hereditary with most cases due to BRCA mutations.  Other genes are associated with hereditary breast cancer syndromes. Based on her family history, which is quite large, we discussed that the likelihood that we would find anything is low.  She meets criteria for testing so we can pursue it if she is interested.  We reviewed the characteristics, features and inheritance patterns of hereditary cancer syndromes. We also discussed genetic testing, including the appropriate family members to test, the process of testing, insurance coverage and turn-around-time for results. We discussed the implications of a negative, positive and/or variant of uncertain significant result. We recommended Kathy Thornton pursue genetic testing for the common hereditary cancer gene panel. The Hereditary Gene Panel offered by [facility] includes sequencing  and/or deletion duplication testing of the following 47 genes: APC, ATM, AXIN2, BARD1, BMPR1A, BRCA1, BRCA2, BRIP1, CDH1, CDK4, CDKN2A (p14ARF), CDKN2A (p16INK4a), CHEK2, CTNNA1, DICER1, EPCAM (Deletion/duplication testing only), GREM1 (promoter region deletion/duplication testing only), KIT, MEN1, MLH1, MSH2, MSH3, MSH6, MUTYH, NBN, NF1, NHTL1, PALB2, PDGFRA, PMS2, POLD1, POLE, PTEN, RAD50, RAD51C, RAD51D, SDHB, SDHC, SDHD, SMAD4, SMARCA4. STK11, TP53, TSC1, TSC2, and VHL.  The following genes were evaluated for sequence changes only: SDHA and HOXB13 c.251G>A variant only.   Based on Kathy Thornton's personal and family history of cancer, she meets medical criteria for genetic testing. Despite that she meets criteria, she may still have an out of pocket cost. We discussed that if her out of pocket cost for testing is  over $100, the laboratory will call and confirm whether she wants to proceed with testing.  If the out of pocket cost of testing is less than $100 she will be billed by the genetic testing laboratory.   PLAN: After considering the risks, benefits, and limitations, Kathy Thornton  provided informed consent to pursue genetic testing and the blood sample was sent to [facility] for analysis of the common hereditary cancer syndrome. Results should be available within approximately 2-3 weeks' time, at which point they will be disclosed by telephone to Kathy Thornton, as will any additional recommendations warranted by these results. Kathy Thornton will receive a summary of her genetic counseling visit and a copy of her results once available. This information will also be available in Epic. We encouraged Kathy Thornton to remain in contact with cancer genetics annually so that we can continuously update the family history and inform her of any changes in cancer genetics and testing that may be of benefit for her family. Kathy Thornton questions were answered to her satisfaction today. Our contact information was  provided should additional questions or concerns arise.  Lastly, we encouraged Kathy Thornton to remain in contact with cancer genetics annually so that we can continuously update the family history and inform her of any changes in cancer genetics and testing that may be of benefit for this family.   Ms.  Thornton questions were answered to her satisfaction today. Our contact information was provided should additional questions or concerns arise. Thank you for the referral and allowing [place] to share in the care of your patient.   Sophonie Goforth P. Florene Glen, [place], [facility] Certified Genetic Counselor [email or web address] phone: [phone]  The patient was seen for a total of 60 minutes in face-to-face genetic counseling.  This patient was discussed with Drs. Magrinat, Lindi Adie and/or Burr Medico who agrees with the above.    _______________________________________________________________________ For Office Staff:  Number of people involved in session: 2 Was an Intern/ student involved with case: no

## 2018-03-15 LAB — VITAMIN D 25 HYDROXY (VIT D DEFICIENCY, FRACTURES): VIT D 25 HYDROXY: 24.7 ng/mL — AB (ref 30.0–100.0)

## 2018-03-16 ENCOUNTER — Other Ambulatory Visit: Payer: Self-pay | Admitting: Oncology

## 2018-03-19 ENCOUNTER — Other Ambulatory Visit: Payer: Self-pay | Admitting: General Surgery

## 2018-03-19 DIAGNOSIS — Z803 Family history of malignant neoplasm of breast: Secondary | ICD-10-CM | POA: Diagnosis not present

## 2018-03-19 DIAGNOSIS — Z8719 Personal history of other diseases of the digestive system: Secondary | ICD-10-CM | POA: Diagnosis not present

## 2018-03-19 DIAGNOSIS — C50212 Malignant neoplasm of upper-inner quadrant of left female breast: Secondary | ICD-10-CM | POA: Diagnosis not present

## 2018-03-20 ENCOUNTER — Other Ambulatory Visit: Payer: Self-pay | Admitting: General Surgery

## 2018-03-20 ENCOUNTER — Other Ambulatory Visit: Payer: Self-pay | Admitting: Family Medicine

## 2018-03-20 DIAGNOSIS — C50212 Malignant neoplasm of upper-inner quadrant of left female breast: Secondary | ICD-10-CM

## 2018-03-20 DIAGNOSIS — M170 Bilateral primary osteoarthritis of knee: Secondary | ICD-10-CM

## 2018-03-21 NOTE — Telephone Encounter (Signed)
Received refill request for traMADol (ULTRAM) 50 MG tablet. Last office visit 10/04/2017 and last refill 11/17/17.

## 2018-03-21 NOTE — Telephone Encounter (Signed)
Seen 6 months ago, she does need to come in to be seen [place]:  01/25/2018  1  11/17/2017  Tramadol Hcl 50 Mg Tablet  30 10 Je Cop  [id number]  [facility] ([id number])  2 15.00 MME Medicare  [place]  12/20/2017  1  11/17/2017  Tramadol Hcl 50 Mg Tablet  30 10 Je Cop  [id number]  [facility] (2191)  1 15.00 MME Medicare  [place]  11/17/2017  1  11/17/2017  Tramadol Hcl 50 Mg Tablet  30 10 Je Cop  [id number]  [facility] ([id number])  0 15.00 MME Medicare  [place]  10/20/2017  1  07/25/2017  Tramadol Hcl 50 Mg Tablet  30 10 Je Cop  [id number]  [facility] ([id number])  3 15.00 MME Medicare  [place]  09/21/2017  1  07/25/2017  Tramadol Hcl 50 Mg Tablet  30 10 Je Cop  [id number]  [facility] (2191)  2      Refill today, note to pt that she needs to come in for a visit

## 2018-03-25 NOTE — H&P (Signed)
Kathy Thornton Location: [facility] Patient #: [id number] DOB: 07-Apr-1934 Married / Language: English / Race: White Female        History of Present Illness        The patient is a 84 year old female who presents with breast cancer. This is a healthy 84 year old woman who was referred by Dr. Gunnar Bulla Thornton consideration of left breast lumpectomy for invasive ductal carcinoma left breast, upper inner quadrant. Her daughter is with her throughout the encounter. Kathy Thornton is her PCP. Imaging studies were done at [facility].        She has no prior history of breast problems. Screening mammograms were performed in January and this revealed a suspicious 8 mm mass in the left breast, 11 o'clock position, 5 cm from the nipple. Axillary ultrasound on the left was normal. Image guided biopsy of the left breast mass shows invasive ductal carcinoma and associated DCIS. ER 90%. PR 80%. Ki-67 at 3%. HER-2 negative. She was started on anastrozole. She is tolerating that fairly well with minimal hot flashes       She initially declined any consideration for surgery. Dr. Jana Thornton does not think she would benefit from chemotherapy or radiation therapy. She has discussed her situation with family members and has decided to go ahead with lumpectomy. She should not need a sentinel lymph node biopsy.       Comorbidities are minimal. She is healthy and active. She had an acute Kathy Thornton lesion in her stomach per EGD in 2016. Cataract. Hemorrhoids. Basically healthy.  Family history reveals breast cancer in a paternal first cousin. Colon cancer in a paternal vocal. No ovarian cancer. Mother had a brain aneurysm. Father had heart disease. He is living at age 59. Social history reveals she is married for 64 years. 9 children. Lives in [place]. Denies alcohol or tobacco. Does her own gardening and housework       She will be scheduled for left breast lumpectomy with radioactive seed  localization I have discussed indications, details, techniques, and numerous risk of the surgery with her and her daughter. She is aware of the risk of bleeding, infection, cosmetic deformity, reoperation for positive margins, nerve damage with chronic pain. She understands these issues well. All of her questions were answered. She agrees with this plan.     Past Surgical History  Breast Biopsy  Left. Cataract Surgery  Bilateral.  Diagnostic Studies History  Colonoscopy  1-5 years ago Mammogram  within last year  Allergies Sulfa Drugs  Allergies Reconciled   Medication History TraMADol HCl (50MG Tablet, Oral) Active. Alendronate Sodium (70MG Tablet, Oral) Active. Pantoprazole Sodium (40MG Tablet DR, Oral) Active. Anastrozole (1MG Tablet, Oral) Active. Medications Reconciled  Social History  Caffeine use  Carbonated beverages, Coffee, Tea. No alcohol use  No drug use  Tobacco use  Never smoker.  Family History Arthritis  Father. Cerebrovascular Accident  Mother. Diabetes Mellitus  Father, Sister. Heart Disease  Brother, Father. Hypertension  Sister.  Pregnancy / Birth History Age at menarche  21 years. Age of menopause  [age] Contraceptive History  Oral contraceptives. Gravida  10 Maternal age  34-20 Para  62  Other Problems Arthritis  Breast Cancer     Review of Systems  General Present- Night Sweats. Not Present- Appetite Loss, Chills, Fatigue, Fever, Weight Gain and Weight Loss. Skin Present- Rash. Not Present- Change in Wart/Mole, Dryness, Hives, Jaundice, New Lesions, Non-Healing Wounds and Ulcer. HEENT Present- Ringing in the Ears, Seasonal Allergies and  Wears glasses/contact lenses. Not Present- Earache, Hearing Loss, Hoarseness, Nose Bleed, Oral Ulcers, Sinus Pain, Sore Throat, Visual Disturbances and Yellow Eyes. Respiratory Not Present- Bloody sputum, Chronic Cough, Difficulty Breathing, Snoring and Wheezing. Breast  Present- Breast Mass. Not Present- Breast Pain, Nipple Discharge and Skin Changes. Cardiovascular Not Present- Chest Pain, Difficulty Breathing Lying Down, Leg Cramps, Palpitations, Rapid Heart Rate, Shortness of Breath and Swelling of Extremities. Gastrointestinal Not Present- Abdominal Pain, Bloating, Bloody Stool, Change in Bowel Habits, Chronic diarrhea, Constipation, Difficulty Swallowing, Excessive gas, Gets full quickly at meals, Hemorrhoids, Indigestion, Nausea, Rectal Pain and Vomiting. Female Genitourinary Not Present- Frequency, Nocturia, Painful Urination, Pelvic Pain and Urgency. Musculoskeletal Present- Joint Pain, Joint Stiffness, Muscle Pain and Swelling of Extremities. Not Present- Back Pain and Muscle Weakness. Neurological Present- Trouble walking. Not Present- Decreased Memory, Fainting, Headaches, Numbness, Seizures, Tingling, Tremor and Weakness. Psychiatric Not Present- Anxiety, Bipolar, Change in Sleep Pattern, Depression, Fearful and Frequent crying. Endocrine Present- Hair Changes and Hot flashes. Not Present- Cold Intolerance, Excessive Hunger, Heat Intolerance and New Diabetes. Hematology Not Present- Blood Thinners, Easy Bruising, Excessive bleeding, Gland problems, HIV and Persistent Infections.  Vitals Weight: 146.13 lb Height: 59in Body Surface Area: 1.61 m Body Mass Index: 29.51 kg/m  Temp.: 97.75F(Oral)  Pulse: 79 (Regular)  BP: 132/82 (Sitting, Left Arm, Standard)     Physical Exam  General Mental Status-Alert. General Appearance-Consistent with stated age. Hydration-Well hydrated. Voice-Normal.  Head and Neck Head-normocephalic, atraumatic with no lesions or palpable masses. Trachea-midline. Thyroid Gland Characteristics - normal size and consistency.  Eye Eyeball - Bilateral-Extraocular movements intact. Sclera/Conjunctiva - Bilateral-No scleral icterus.  Chest and Lung Exam Chest and lung exam reveals -quiet,  even and easy respiratory effort with no use of accessory muscles and on auscultation, normal breath sounds, no adventitious sounds and normal vocal resonance. Inspection Chest Wall - Normal. Back - normal.  Breast Note: Breasts are medium size. Nontender. I don't really palpate a mass on either side. There is no skin change. No axillary adenopathy.   Cardiovascular Cardiovascular examination reveals -normal heart sounds, regular rate and rhythm with no murmurs and normal pedal pulses bilaterally.  Abdomen Inspection Inspection of the abdomen reveals - No Hernias. Skin - Scar - no surgical scars. Palpation/Percussion Palpation and Percussion of the abdomen reveal - Soft, Non Tender, No Rebound tenderness, No Rigidity (guarding) and No hepatosplenomegaly. Auscultation Auscultation of the abdomen reveals - Bowel sounds normal.  Neurologic Neurologic evaluation reveals -alert and oriented x 3 with no impairment of recent or remote memory. Mental Status-Normal.  Musculoskeletal Normal Exam - Left-Upper Extremity Strength Normal and Lower Extremity Strength Normal. Normal Exam - Right-Upper Extremity Strength Normal and Lower Extremity Strength Normal.  Lymphatic Head & Neck  General Head & Neck Lymphatics: Bilateral - Description - Normal. Axillary  General Axillary Region: Bilateral - Description - Normal. Tenderness - Non Tender. Femoral & Inguinal  Generalized Femoral & Inguinal Lymphatics: Bilateral - Description - Normal. Tenderness - Non Tender.    Assessment & Plan  PRIMARY CANCER OF UPPER INNER QUADRANT OF LEFT FEMALE BREAST (C50.212)   your imaging studies and biopsies confirm that you have a small, 8 mm invasive ductal carcinoma in the left breast, upper inner quadrant, 11 o'clock position. Dr. Jana Thornton has placed you on anastrozole which is holding the cancer in check quite nicely Yopu have decided to proceed with left breast lumpectomy you will not  need chemotherapy and you will not need radiation therapy You do not need nor are  you likely to benefit from any lymph node dissection  you will be scheduled for left breast lumpectomy with radioactive seed localization. I have discussed the indications, techniques, and risk of the surgery in detail with you and your daughter  FAMILY HISTORY OF BREAST CANCER IN FEMALE (Z80.3) Impression: Paternal first cousin HISTORY OF GASTRIC ULCER (Z87.19) Impression: Kathy Thornton lesion    Edsel Petrin. Dalbert Batman, M.D., [facility] General and Minimally invasive Surgery Breast and Colorectal Surgery Office:   [phone] Pager:   [phone]

## 2018-03-26 NOTE — Pre-Procedure Instructions (Addendum)
ZAELA GRALEY  03/26/2018      [facility] [phone] - [place], [place] [address] [place] [address] Phone: [phone] Fax: [phone]    Your procedure is scheduled on April 26.  Report to [facility] Admitting at 530 A.M.  Call this number if you have problems the morning of surgery:  [phone]   Remember:  Do not eat food or drink liquids after midnight.  Take these medicines the morning of surgery with A SIP OF WATER Tramadol (ultram) if needed, Pantoprazole (protonix), anastrozole (Arimidex) Drink pre-surgical Ensure before leaving home on the day of surgery   Stop taking aspirin, BC's, Goody's, Herbal medications, Ibuprofen, Advil, Motrin, Vitamins    Do not wear jewelry, make-up or nail polish.  Do not wear lotions, powders, or perfumes, or deodorant.  Do not shave 48 hours prior to surgery.  Men may shave face and neck.  Do not bring valuables to the hospital.  [facility] is not responsible for any belongings or valuables.  Contacts, dentures or bridgework may not be worn into surgery.  Leave your suitcase in the car.  After surgery it may be brought to your room.  For patients admitted to the hospital, discharge time will be determined by your treatment team.  Patients discharged the day of surgery will not be allowed to drive home.    Special instructions:   [place]- Preparing For Surgery  Before surgery, you can play an important role. Because skin is not sterile, your skin needs to be as free of germs as possible. You can reduce the number of germs on your skin by washing with CHG (chlorahexidine gluconate) Soap before surgery.  CHG is an antiseptic cleaner which kills germs and bonds with the skin to continue killing germs even after washing.  Please do not use if you have an allergy to CHG or antibacterial soaps. If your skin becomes reddened/irritated stop using the  CHG.  Do not shave (including legs and underarms) for at least 48 hours prior to first CHG shower. It is OK to shave your face.  Please follow these instructions carefully.   1. Shower the NIGHT BEFORE SURGERY and the MORNING OF SURGERY with CHG.   2. If you chose to wash your hair, wash your hair first as usual with your normal shampoo.  3. After you shampoo, rinse your hair and body thoroughly to remove the shampoo.  4. Use CHG as you would any other liquid soap. You can apply CHG directly to the skin and wash gently with a scrungie or a clean washcloth.   5. Apply the CHG Soap to your body ONLY FROM THE NECK DOWN.  Do not use on open wounds or open sores. Avoid contact with your eyes, ears, mouth and genitals (private parts). Wash Face and genitals (private parts)  with your normal soap.  6. Wash thoroughly, paying special attention to the area where your surgery will be performed.  7. Thoroughly rinse your body with warm water from the neck down.  8. DO NOT shower/wash with your normal soap after using and rinsing off the CHG Soap.  9. Pat yourself dry with a CLEAN TOWEL.  10. Wear CLEAN PAJAMAS to bed the night before surgery, wear comfortable clothes the morning of surgery  11. Place CLEAN SHEETS on your bed the night of your first shower and DO NOT SLEEP WITH  PETS.    Day of Surgery: Do not apply any deodorants/lotions. Please wear clean clothes to the hospital/surgery center.       Please read over the following fact sheets that you were given. Pain Booklet, Coughing and Deep Breathing and Surgical Site Infection Prevention

## 2018-03-27 ENCOUNTER — Encounter (HOSPITAL_COMMUNITY)
Admission: RE | Admit: 2018-03-27 | Discharge: 2018-03-27 | Disposition: A | Discharge status: Home / Self Care | Payer: Medicare Other | Source: Ambulatory Visit | Attending: General Surgery | Admitting: General Surgery

## 2018-03-27 ENCOUNTER — Other Ambulatory Visit: Payer: Self-pay

## 2018-03-27 ENCOUNTER — Telehealth: Payer: Self-pay | Admitting: Genetic Counselor

## 2018-03-27 ENCOUNTER — Encounter (HOSPITAL_COMMUNITY): Payer: Self-pay

## 2018-03-27 ENCOUNTER — Encounter: Payer: Self-pay | Admitting: Genetic Counselor

## 2018-03-27 DIAGNOSIS — Z170 Estrogen receptor positive status [ER+]: Secondary | ICD-10-CM | POA: Diagnosis not present

## 2018-03-27 DIAGNOSIS — Z803 Family history of malignant neoplasm of breast: Secondary | ICD-10-CM | POA: Diagnosis not present

## 2018-03-27 DIAGNOSIS — Z1379 Encounter for other screening for genetic and chromosomal anomalies: Secondary | ICD-10-CM | POA: Insufficient documentation

## 2018-03-27 DIAGNOSIS — C50212 Malignant neoplasm of upper-inner quadrant of left female breast: Secondary | ICD-10-CM | POA: Diagnosis not present

## 2018-03-27 DIAGNOSIS — Z8719 Personal history of other diseases of the digestive system: Secondary | ICD-10-CM | POA: Diagnosis not present

## 2018-03-27 DIAGNOSIS — Z79899 Other long term (current) drug therapy: Secondary | ICD-10-CM | POA: Diagnosis not present

## 2018-03-27 HISTORY — DX: Malignant neoplasm of unspecified site of unspecified female breast: C50.919

## 2018-03-27 HISTORY — DX: Cardiac murmur, unspecified: R01.1

## 2018-03-27 LAB — BASIC METABOLIC PANEL WITH GFR
Anion gap: 9 (ref 5–15)
BUN: 12 mg/dL (ref 6–20)
CO2: 25 mmol/L (ref 22–32)
Calcium: 9.4 mg/dL (ref 8.9–10.3)
Chloride: 105 mmol/L (ref 101–111)
Creatinine, Ser: 0.96 mg/dL (ref 0.44–1.00)
GFR calc Af Amer: >60 mL/min
GFR calc non Af Amer: 54 mL/min — ABNORMAL LOW
Glucose, Bld: 95 mg/dL (ref 65–99)
Potassium: 4.2 mmol/L (ref 3.5–5.1)
Sodium: 139 mmol/L (ref 135–145)

## 2018-03-27 LAB — CBC
HCT: 38.4 % (ref 36.0–46.0)
Hemoglobin: 12.4 g/dL (ref 12.0–15.0)
MCH: 29.5 pg (ref 26.0–34.0)
MCHC: 32.3 g/dL (ref 30.0–36.0)
MCV: 91.2 fL (ref 78.0–100.0)
Platelets: 196 K/uL (ref 150–400)
RBC: 4.21 MIL/uL (ref 3.87–5.11)
RDW: 14 % (ref 11.5–15.5)
WBC: 5.8 K/uL (ref 4.0–10.5)

## 2018-03-27 NOTE — Telephone Encounter (Signed)
Spoke with patient's daughter.  Revealed negative genetic testing.  Discussed that we do not know why she has breast cancer or why there is cancer in the family. It could be due to a different gene that we are not testing, or maybe our current technology may not be able to pick something up.  It will be important for her to keep in contact with genetics to keep up with whether additional testing may be needed.   Discussed that there was one VUS.  This is still a normal result.

## 2018-03-27 NOTE — Telephone Encounter (Signed)
LM on VM with good news.  Asked that she please CB.  Left CB instructions.

## 2018-03-27 NOTE — Progress Notes (Signed)
PCP - Dr. [name]  Cardiologist - Denies  Chest x-ray - Denies  EKG - Denies  Stress Test - Denies  ECHO - 11/05/15 (E)  Cardiac Cath - Denies  Sleep Study - Denies CPAP - None  LABS- 03/27/18: CBC, BMP   Anesthesia- No  Pt denies having chest pain, sob, or fever at this time. All instructions explained to the pt, with a verbal understanding of the material. Pt agrees to go over the instructions while at home for a better understanding. The opportunity to ask questions was provided.

## 2018-03-28 ENCOUNTER — Ambulatory Visit
Admission: RE | Admit: 2018-03-28 | Discharge: 2018-03-28 | Disposition: A | Discharge status: Home / Self Care | Payer: Medicare Other | Source: Ambulatory Visit | Attending: General Surgery | Admitting: General Surgery

## 2018-03-28 DIAGNOSIS — C50212 Malignant neoplasm of upper-inner quadrant of left female breast: Secondary | ICD-10-CM

## 2018-03-28 DIAGNOSIS — C50912 Malignant neoplasm of unspecified site of left female breast: Secondary | ICD-10-CM | POA: Diagnosis not present

## 2018-03-29 ENCOUNTER — Encounter: Payer: Self-pay | Admitting: Genetic Counselor

## 2018-03-29 ENCOUNTER — Ambulatory Visit: Payer: Self-pay | Admitting: Genetic Counselor

## 2018-03-29 DIAGNOSIS — Z800 Family history of malignant neoplasm of digestive organs: Secondary | ICD-10-CM

## 2018-03-29 DIAGNOSIS — C50212 Malignant neoplasm of upper-inner quadrant of left female breast: Secondary | ICD-10-CM

## 2018-03-29 DIAGNOSIS — Z803 Family history of malignant neoplasm of breast: Secondary | ICD-10-CM

## 2018-03-29 DIAGNOSIS — Z1379 Encounter for other screening for genetic and chromosomal anomalies: Secondary | ICD-10-CM

## 2018-03-29 DIAGNOSIS — Z170 Estrogen receptor positive status [ER+]: Secondary | ICD-10-CM

## 2018-03-29 NOTE — Progress Notes (Addendum)
HPI:  Kathy Thornton was previously seen in the [facility] due to a personal and family history of cancer and concerns regarding a hereditary predisposition to cancer. Please refer to our prior cancer genetics clinic note for more information regarding Kathy Thornton's medical, social and family histories, and our assessment and recommendations, at the time. Kathy Thornton recent genetic test results were disclosed to her, as were recommendations warranted by these results. These results and recommendations are discussed in more detail below.  CANCER HISTORY:    Malignant neoplasm of upper-inner quadrant of left breast in female, estrogen receptor positive ([place])   01/02/2018 Initial Diagnosis    Malignant neoplasm of upper-inner quadrant of left breast in female, estrogen receptor positive ([place])      03/23/2018 Genetic Testing    APC c.7468G>A (p.Asp2490Asn) VUS identified on the common hereditary cancer panel.  The Hereditary Gene Panel offered by [facility] includes sequencing and/or deletion duplication testing of the following 47 genes: APC, ATM, AXIN2, BARD1, BMPR1A, BRCA1, BRCA2, BRIP1, CDH1, CDK4, CDKN2A (p14ARF), CDKN2A (p16INK4a), CHEK2, CTNNA1, DICER1, EPCAM (Deletion/duplication testing only), GREM1 (promoter region deletion/duplication testing only), KIT, MEN1, MLH1, MSH2, MSH3, MSH6, MUTYH, NBN, NF1, NHTL1, PALB2, PDGFRA, PMS2, POLD1, POLE, PTEN, RAD50, RAD51C, RAD51D, SDHB, SDHC, SDHD, SMAD4, SMARCA4. STK11, TP53, TSC1, TSC2, and VHL.  The following genes were evaluated for sequence changes only: SDHA and HOXB13 c.251G>A variant only. The report date is March 23, 2018.       FAMILY HISTORY:  We obtained a detailed, 4-generation family history.  Significant diagnoses are listed below: Family History  Problem Relation Age of Onset   Aneurysm Mother        brain   Heart disease Father    Heart disease Brother    AAA (abdominal aortic aneurysm) Maternal Uncle    Colon  cancer Paternal Uncle        dx >50   Breast cancer Cousin        pat cousin, dx in her late 9s    The patient has nine children.  One daughter died at 67 months from an accident.  The patient has two brothers and four sisters.  None had cancer.  Both parents are deceased.   The patient's mother died of a brain aneurysm.  She had one brother who had an AAA.  Both parents died of non cancer related issues.   The patient's father was one of nine children, and his father remarried and 6-7 more.  One full brother had colon cancer over age 42 and his daughter had breast cancer in her 70's.   Kathy Thornton is unaware of previous family history of genetic testing for hereditary cancer risks. Patient's maternal ancestors are of Caucasian descent, and paternal ancestors are of Caucasian descent. There is no reported Ashkenazi Jewish ancestry. There is no known consanguinity.   GENETIC TEST RESULTS: Genetic testing reported out on March 23, 2018 through the Common hereditary cancer panel found no deleterious mutations.  The Hereditary Gene Panel offered by [facility] includes sequencing and/or deletion duplication testing of the following 47 genes: APC, ATM, AXIN2, BARD1, BMPR1A, BRCA1, BRCA2, BRIP1, CDH1, CDK4, CDKN2A (p14ARF), CDKN2A (p16INK4a), CHEK2, CTNNA1, DICER1, EPCAM (Deletion/duplication testing only), GREM1 (promoter region deletion/duplication testing only), KIT, MEN1, MLH1, MSH2, MSH3, MSH6, MUTYH, NBN, NF1, NHTL1, PALB2, PDGFRA, PMS2, POLD1, POLE, PTEN, RAD50, RAD51C, RAD51D, SDHB, SDHC, SDHD, SMAD4, SMARCA4. STK11, TP53, TSC1, TSC2, and VHL.  The following genes were evaluated for sequence changes only: SDHA  and HOXB13 c.251G>A variant only.  The test report has been scanned into EPIC and is located under the Molecular Pathology section of the Results Review tab.    We discussed with Kathy Thornton that since the current genetic testing is not perfect, it is possible there may be a gene mutation in one  of these genes that current testing cannot detect, but that chance is small.  We also discussed, that it is possible that another gene that has not yet been discovered, or that we have not yet tested, is responsible for the cancer diagnoses in the family, and it is, therefore, important to remain in touch with cancer genetics in the future so that we can continue to offer Kathy Thornton the most up to date genetic testing.   Genetic testing did detect a Variant of Unknown Significance in the APC gene called c.7468G>A (p.Asp2490Asn). At this time, it is unknown if this variant is associated with increased cancer risk or if this is a normal finding, but most variants such as this get reclassified to being inconsequential. It should not be used to make medical management decisions. With time, we suspect the lab will determine the significance of this variant, if any. If we do learn more about it, we will try to contact Kathy Thornton to discuss it further. However, it is important to stay in touch with [place] periodically and keep the address and phone number up to date.   UPDATE: APC c.7468G>A (p.Asp2490Asn) VUS has been reclassified to Likely Benign.  The amended report date is September 27, 2021.   CANCER SCREENING RECOMMENDATIONS: This result is reassuring and indicates that Kathy Thornton likely does not have an increased risk for a future cancer due to a mutation in one of these genes. This normal test also suggests that Kathy Thornton's cancer was most likely not due to an inherited predisposition associated with one of these genes.  Most cancers happen by chance and this negative test suggests that her cancer falls into this category.  We, therefore, recommended she continue to follow the cancer management and screening guidelines provided by her oncology and primary healthcare provider.   An individuals cancer risk and medical management are not determined by genetic test results alone. Overall cancer risk assessment  incorporates additional factors, including personal medical history, family history, and any available genetic information that may result in a personalized plan for cancer prevention and surveillance.  RECOMMENDATIONS FOR FAMILY MEMBERS:  Women in this family might be at some increased risk of developing cancer, over the general population risk, simply due to the family history of cancer.  We recommended women in this family have a yearly mammogram beginning at age 32, or 4 years younger than the earliest onset of cancer, an annual clinical breast exam, and perform monthly breast self-exams. Women in this family should also have a gynecological exam as recommended by their primary provider. All family members should have a colonoscopy by age 44.  FOLLOW-UP: Lastly, we discussed with Kathy Thornton that cancer genetics is a rapidly advancing field and it is possible that new genetic tests will be appropriate for her and/or her family members in the future. We encouraged her to remain in contact with cancer genetics on an annual basis so we can update her personal and family histories and let her know of advances in cancer genetics that may benefit this family.   Our contact number was provided. Kathy Thornton questions were answered to her satisfaction, and she knows  she is welcome to call us at anytime with additional questions or concerns.   Roma Kayser, MS, [facility] Certified Genetic Counselor [email or web address]

## 2018-03-30 ENCOUNTER — Encounter (HOSPITAL_COMMUNITY)
Admission: RE | Disposition: A | Discharge status: Home / Self Care | Payer: Self-pay | Source: Ambulatory Visit | Attending: General Surgery

## 2018-03-30 ENCOUNTER — Other Ambulatory Visit: Payer: Self-pay

## 2018-03-30 ENCOUNTER — Ambulatory Visit (HOSPITAL_COMMUNITY)
Admission: RE | Admit: 2018-03-30 | Discharge: 2018-03-30 | Disposition: A | Discharge status: Home / Self Care | Payer: Medicare Other | Source: Ambulatory Visit | Attending: General Surgery | Admitting: General Surgery

## 2018-03-30 ENCOUNTER — Telehealth: Payer: Self-pay | Admitting: Oncology

## 2018-03-30 ENCOUNTER — Ambulatory Visit
Admission: RE | Admit: 2018-03-30 | Discharge: 2018-03-30 | Disposition: A | Discharge status: Home / Self Care | Payer: Medicare Other | Source: Ambulatory Visit | Attending: General Surgery | Admitting: General Surgery

## 2018-03-30 ENCOUNTER — Encounter (HOSPITAL_COMMUNITY): Payer: Self-pay | Admitting: Orthopedic Surgery

## 2018-03-30 ENCOUNTER — Ambulatory Visit (HOSPITAL_COMMUNITY): Payer: Medicare Other | Admitting: Certified Registered"

## 2018-03-30 DIAGNOSIS — Z803 Family history of malignant neoplasm of breast: Secondary | ICD-10-CM | POA: Diagnosis not present

## 2018-03-30 DIAGNOSIS — C50212 Malignant neoplasm of upper-inner quadrant of left female breast: Secondary | ICD-10-CM

## 2018-03-30 DIAGNOSIS — Z8719 Personal history of other diseases of the digestive system: Secondary | ICD-10-CM | POA: Diagnosis not present

## 2018-03-30 DIAGNOSIS — M810 Age-related osteoporosis without current pathological fracture: Secondary | ICD-10-CM | POA: Diagnosis not present

## 2018-03-30 DIAGNOSIS — Z79899 Other long term (current) drug therapy: Secondary | ICD-10-CM | POA: Insufficient documentation

## 2018-03-30 DIAGNOSIS — Z170 Estrogen receptor positive status [ER+]: Secondary | ICD-10-CM | POA: Diagnosis not present

## 2018-03-30 DIAGNOSIS — C50912 Malignant neoplasm of unspecified site of left female breast: Secondary | ICD-10-CM | POA: Diagnosis not present

## 2018-03-30 DIAGNOSIS — D509 Iron deficiency anemia, unspecified: Secondary | ICD-10-CM | POA: Diagnosis not present

## 2018-03-30 HISTORY — PX: BREAST LUMPECTOMY WITH RADIOACTIVE SEED LOCALIZATION: SHX6424

## 2018-03-30 HISTORY — PX: BREAST LUMPECTOMY: SHX2

## 2018-03-30 SURGERY — BREAST LUMPECTOMY WITH RADIOACTIVE SEED LOCALIZATION
Anesthesia: General | Site: Breast | Laterality: Left

## 2018-03-30 MED ORDER — BUPIVACAINE-EPINEPHRINE (PF) 0.25% -1:200000 IJ SOLN
INTRAMUSCULAR | Status: AC
  Filled 2018-03-30: qty 30

## 2018-03-30 MED ORDER — ACETAMINOPHEN 325 MG PO TABS
650.0000 mg | ORAL_TABLET | ORAL | Status: DC | PRN
  Filled 2018-03-30: qty 2

## 2018-03-30 MED ORDER — BUPIVACAINE-EPINEPHRINE 0.25% -1:200000 IJ SOLN
INTRAMUSCULAR | Status: DC | PRN
  Administered 2018-03-30: 10 mL

## 2018-03-30 MED ORDER — DEXAMETHASONE SODIUM PHOSPHATE 10 MG/ML IJ SOLN
INTRAMUSCULAR | Status: DC | PRN
  Administered 2018-03-30: 10 mg via INTRAVENOUS

## 2018-03-30 MED ORDER — PHENYLEPHRINE 40 MCG/ML (10ML) SYRINGE FOR IV PUSH (FOR BLOOD PRESSURE SUPPORT)
PREFILLED_SYRINGE | INTRAVENOUS | Status: DC | PRN
  Administered 2018-03-30: 120 ug via INTRAVENOUS

## 2018-03-30 MED ORDER — 0.9 % SODIUM CHLORIDE (POUR BTL) OPTIME
TOPICAL | Status: DC | PRN
  Administered 2018-03-30: 1000 mL

## 2018-03-30 MED ORDER — CEFAZOLIN SODIUM-DEXTROSE 2-4 GM/100ML-% IV SOLN
2.0000 g | INTRAVENOUS | Status: AC
  Administered 2018-03-30: 2 g via INTRAVENOUS
  Filled 2018-03-30: qty 100

## 2018-03-30 MED ORDER — GABAPENTIN 300 MG PO CAPS
300.0000 mg | ORAL_CAPSULE | ORAL | Status: AC
  Administered 2018-03-30: 300 mg via ORAL
  Filled 2018-03-30: qty 1

## 2018-03-30 MED ORDER — PHENYLEPHRINE 40 MCG/ML (10ML) SYRINGE FOR IV PUSH (FOR BLOOD PRESSURE SUPPORT)
PREFILLED_SYRINGE | INTRAVENOUS | Status: AC
  Filled 2018-03-30: qty 10

## 2018-03-30 MED ORDER — FENTANYL CITRATE (PF) 100 MCG/2ML IJ SOLN
INTRAMUSCULAR | Status: DC | PRN
  Administered 2018-03-30: 50 ug via INTRAVENOUS

## 2018-03-30 MED ORDER — PROPOFOL 10 MG/ML IV BOLUS
INTRAVENOUS | Status: DC | PRN
  Administered 2018-03-30: 150 mg via INTRAVENOUS

## 2018-03-30 MED ORDER — FENTANYL CITRATE (PF) 100 MCG/2ML IJ SOLN: 25.0000 ug | INTRAMUSCULAR | Status: DC | PRN

## 2018-03-30 MED ORDER — CHLORHEXIDINE GLUCONATE CLOTH 2 % EX PADS: 6.0000 | MEDICATED_PAD | Freq: Once | CUTANEOUS | Status: DC

## 2018-03-30 MED ORDER — GLYCOPYRROLATE 0.2 MG/ML IV SOSY
PREFILLED_SYRINGE | INTRAVENOUS | Status: DC | PRN
  Administered 2018-03-30: .2 mg via INTRAVENOUS

## 2018-03-30 MED ORDER — ACETAMINOPHEN 500 MG PO TABS
ORAL_TABLET | ORAL | Status: AC
  Filled 2018-03-30: qty 1

## 2018-03-30 MED ORDER — PROPOFOL 10 MG/ML IV BOLUS
INTRAVENOUS | Status: AC
  Filled 2018-03-30: qty 20

## 2018-03-30 MED ORDER — LIDOCAINE 2% (20 MG/ML) 5 ML SYRINGE
INTRAMUSCULAR | Status: DC | PRN
  Administered 2018-03-30: 50 mg via INTRAVENOUS

## 2018-03-30 MED ORDER — LACTATED RINGERS IV SOLN
INTRAVENOUS | Status: DC | PRN
  Administered 2018-03-30: 07:00:00 via INTRAVENOUS

## 2018-03-30 MED ORDER — PHENYLEPHRINE HCL 10 MG/ML IJ SOLN
INTRAVENOUS | Status: DC | PRN
  Administered 2018-03-30: 25 ug/min via INTRAVENOUS

## 2018-03-30 MED ORDER — FENTANYL CITRATE (PF) 250 MCG/5ML IJ SOLN
INTRAMUSCULAR | Status: AC
  Filled 2018-03-30: qty 5

## 2018-03-30 MED ORDER — ONDANSETRON HCL 4 MG/2ML IJ SOLN
INTRAMUSCULAR | Status: DC | PRN
  Administered 2018-03-30: 4 mg via INTRAVENOUS

## 2018-03-30 MED ORDER — OXYCODONE HCL 5 MG PO TABS: 5.0000 mg | ORAL_TABLET | ORAL | Status: DC | PRN

## 2018-03-30 MED ORDER — ACETAMINOPHEN 650 MG RE SUPP
650.0000 mg | RECTAL | Status: DC | PRN
  Filled 2018-03-30: qty 1

## 2018-03-30 MED ORDER — ONDANSETRON HCL 4 MG/2ML IJ SOLN
INTRAMUSCULAR | Status: AC
  Filled 2018-03-30: qty 2

## 2018-03-30 MED ORDER — LIDOCAINE 2% (20 MG/ML) 5 ML SYRINGE
INTRAMUSCULAR | Status: AC
  Filled 2018-03-30: qty 5

## 2018-03-30 MED ORDER — ACETAMINOPHEN 500 MG PO TABS
1000.0000 mg | ORAL_TABLET | ORAL | Status: AC
  Administered 2018-03-30: 1000 mg via ORAL
  Filled 2018-03-30: qty 2

## 2018-03-30 SURGICAL SUPPLY — 51 items
ADH SKN CLS APL DERMABOND .7 (GAUZE/BANDAGES/DRESSINGS) ×1
APPLIER CLIP 9.375 MED OPEN (MISCELLANEOUS) ×2
APR CLP MED 9.3 20 MLT OPN (MISCELLANEOUS) ×1
BINDER BREAST LRG (GAUZE/BANDAGES/DRESSINGS) ×1 IMPLANT
BINDER BREAST XLRG (GAUZE/BANDAGES/DRESSINGS) IMPLANT
BLADE SURG 15 STRL LF DISP TIS (BLADE) ×1 IMPLANT
BLADE SURG 15 STRL SS (BLADE) ×2
CANISTER SUCT 3000ML PPV (MISCELLANEOUS) ×2 IMPLANT
CHLORAPREP W/TINT 26ML (MISCELLANEOUS) ×2 IMPLANT
CLIP APPLIE 9.375 MED OPEN (MISCELLANEOUS) ×1 IMPLANT
COVER PROBE W GEL 5X96 (DRAPES) ×2 IMPLANT
COVER SURGICAL LIGHT HANDLE (MISCELLANEOUS) ×2 IMPLANT
DERMABOND ADVANCED (GAUZE/BANDAGES/DRESSINGS) ×1
DERMABOND ADVANCED .7 DNX12 (GAUZE/BANDAGES/DRESSINGS) ×1 IMPLANT
DEVICE DUBIN SPECIMEN MAMMOGRA (MISCELLANEOUS) ×2 IMPLANT
DRAPE CHEST BREAST 15X10 FENES (DRAPES) ×2 IMPLANT
DRAPE UTILITY XL STRL (DRAPES) ×2 IMPLANT
DRSG PAD ABDOMINAL 8X10 ST (GAUZE/BANDAGES/DRESSINGS) ×2 IMPLANT
ELECT CAUTERY BLADE 6.4 (BLADE) ×2 IMPLANT
ELECT REM PT RETURN 9FT ADLT (ELECTROSURGICAL) ×2
ELECTRODE REM PT RTRN 9FT ADLT (ELECTROSURGICAL) ×1 IMPLANT
GAUZE SPONGE 4X4 12PLY STRL LF (GAUZE/BANDAGES/DRESSINGS) ×2 IMPLANT
GLOVE BIOGEL PI IND STRL 7.0 (GLOVE) IMPLANT
GLOVE BIOGEL PI IND STRL 7.5 (GLOVE) IMPLANT
GLOVE BIOGEL PI INDICATOR 7.0 (GLOVE) ×1
GLOVE BIOGEL PI INDICATOR 7.5 (GLOVE) ×2
GLOVE ECLIPSE 7.5 STRL STRAW (GLOVE) ×2 IMPLANT
GLOVE EUDERMIC 7 POWDERFREE (GLOVE) ×4 IMPLANT
GOWN STRL REUS W/ TWL LRG LVL3 (GOWN DISPOSABLE) ×1 IMPLANT
GOWN STRL REUS W/ TWL XL LVL3 (GOWN DISPOSABLE) ×1 IMPLANT
GOWN STRL REUS W/TWL LRG LVL3 (GOWN DISPOSABLE) ×6
GOWN STRL REUS W/TWL XL LVL3 (GOWN DISPOSABLE) ×2
ILLUMINATOR WAVEGUIDE N/F (MISCELLANEOUS) IMPLANT
KIT BASIN OR (CUSTOM PROCEDURE TRAY) ×2 IMPLANT
KIT MARKER MARGIN INK (KITS) ×2 IMPLANT
LIGHT WAVEGUIDE WIDE FLAT (MISCELLANEOUS) IMPLANT
NDL HYPO 25GX1X1/2 BEV (NEEDLE) ×1 IMPLANT
NEEDLE HYPO 25GX1X1/2 BEV (NEEDLE) ×2 IMPLANT
NS IRRIG 1000ML POUR BTL (IV SOLUTION) ×2 IMPLANT
PACK SURGICAL SETUP 50X90 (CUSTOM PROCEDURE TRAY) ×2 IMPLANT
PENCIL BUTTON HOLSTER BLD 10FT (ELECTRODE) ×2 IMPLANT
SPONGE LAP 4X18 X RAY DECT (DISPOSABLE) ×2 IMPLANT
SUT MNCRL AB 4-0 PS2 18 (SUTURE) ×2 IMPLANT
SUT SILK 2 0 SH (SUTURE) ×2 IMPLANT
SUT VIC AB 3-0 SH 18 (SUTURE) ×2 IMPLANT
SYR BULB 3OZ (MISCELLANEOUS) ×2 IMPLANT
SYR CONTROL 10ML LL (SYRINGE) ×2 IMPLANT
TOWEL OR 17X24 6PK STRL BLUE (TOWEL DISPOSABLE) ×2 IMPLANT
TOWEL OR 17X26 10 PK STRL BLUE (TOWEL DISPOSABLE) ×2 IMPLANT
TUBE CONNECTING 12X1/4 (SUCTIONS) ×2 IMPLANT
YANKAUER SUCT BULB TIP NO VENT (SUCTIONS) ×2 IMPLANT

## 2018-03-30 NOTE — Anesthesia Preprocedure Evaluation (Signed)
Anesthesia Evaluation  Patient identified by MRN, date of birth, ID band Patient awake    Reviewed: Allergy & Precautions, NPO status   Airway Mallampati: II  TM Distance: >3 FB     Dental   Pulmonary    breath sounds clear to auscultation       Cardiovascular  Rhythm:Regular Rate:Normal     Neuro/Psych    GI/Hepatic Neg liver ROS, PUD,   Endo/Other    Renal/GU negative Renal ROS     Musculoskeletal  (+) Arthritis ,   Abdominal   Peds  Hematology  (+) anemia ,   Anesthesia Other Findings   Reproductive/Obstetrics                             Anesthesia Physical Anesthesia Plan  ASA: III  Anesthesia Plan: General   Post-op Pain Management:    Induction: Intravenous  PONV Risk Score and Plan: 3 and Treatment may vary due to age or medical condition, Ondansetron and Dexamethasone  Airway Management Planned: Oral ETT  Additional Equipment:   Intra-op Plan:   Post-operative Plan: Extubation in OR  Informed Consent: I have reviewed the patients History and Physical, chart, labs and discussed the procedure including the risks, benefits and alternatives for the proposed anesthesia with the patient or authorized representative who has indicated his/her understanding and acceptance.   Dental advisory given  Plan Discussed with: CRNA and Anesthesiologist  Anesthesia Plan Comments:         Anesthesia Quick Evaluation

## 2018-03-30 NOTE — Transfer of Care (Signed)
Immediate Anesthesia Transfer of Care Note  Patient: Kathy Thornton  Procedure(s) Performed: RADIOACTIVE SEED GUIDED LEFT BREAST LUMPECTOMY (Left Breast)  Patient Location: PACU  Anesthesia Type:General  Level of Consciousness: awake, alert  and oriented  Airway & Oxygen Therapy: Patient Spontanous Breathing  Post-op Assessment: Report given to RN  Post vital signs: Reviewed and stable  Last Vitals:  Vitals Value Taken Time  BP 131/67 03/30/2018  8:29 AM  Temp    Pulse 111 03/30/2018  8:31 AM  Resp 13 03/30/2018  8:31 AM  SpO2 99 % 03/30/2018  8:31 AM  Vitals shown include unvalidated device data.  Last Pain:  Vitals:   03/30/18 0603  TempSrc:   PainSc: 0-No pain         Complications: No apparent anesthesia complications

## 2018-03-30 NOTE — Interval H&P Note (Signed)
History and Physical Interval Note:  03/30/2018 5:55 AM  Kathy Thornton  has presented today for surgery, with the diagnosis of LEFT BREAST CANCER  The various methods of treatment have been discussed with the patient and family. After consideration of risks, benefits and other options for treatment, the patient has consented to  Procedure(s): BREAST LUMPECTOMY WITH RADIOACTIVE SEED LOCALIZATION (Left) as a surgical intervention .  The patient's history has been reviewed, patient examined, no change in status, stable for surgery.  I have reviewed the patient's chart and labs.  Questions were answered to the patient's satisfaction.     [name]

## 2018-03-30 NOTE — Telephone Encounter (Signed)
patient called to reschedule °

## 2018-03-30 NOTE — Anesthesia Procedure Notes (Signed)
Procedure Name: LMA Insertion Date/Time: 03/30/2018 7:35 AM Performed by: Barrington Ellison, CRNA Pre-anesthesia Checklist: Patient identified, Emergency Drugs available, Suction available and Patient being monitored Patient Re-evaluated:Patient Re-evaluated prior to induction Oxygen Delivery Method: Circle System Utilized Preoxygenation: Pre-oxygenation with 100% oxygen Induction Type: IV induction Ventilation: Mask ventilation without difficulty LMA: LMA inserted LMA Size: 4.0 Number of attempts: 1 Placement Confirmation: positive ETCO2 Tube secured with: Tape Dental Injury: Teeth and Oropharynx as per pre-operative assessment

## 2018-03-30 NOTE — Anesthesia Postprocedure Evaluation (Signed)
Anesthesia Post Note  Patient: Kathy Thornton  Procedure(s) Performed: RADIOACTIVE SEED GUIDED LEFT BREAST LUMPECTOMY (Left Breast)     Patient location during evaluation: PACU Anesthesia Type: General Level of consciousness: awake Pain management: pain level controlled Vital Signs Assessment: post-procedure vital signs reviewed and stable Respiratory status: spontaneous breathing Cardiovascular status: stable Anesthetic complications: no    Last Vitals:  Vitals:   03/30/18 0858 03/30/18 0900  BP: (!) 134/59   Pulse: 78 76  Resp: 16 19  Temp: (!) 36.4 C (!) 36.4 C  SpO2: 100% 100%    Last Pain:  Vitals:   03/30/18 0853  TempSrc:   PainSc: 0-No pain                 [name]

## 2018-03-30 NOTE — Discharge Instructions (Signed)
[facility] °Office Phone Number [phone] ° °BREAST BIOPSY/ PARTIAL MASTECTOMY: POST OP INSTRUCTIONS ° °Always review your discharge instruction sheet given to you by the facility where your surgery was performed. ° °IF YOU HAVE DISABILITY OR FAMILY LEAVE FORMS, YOU MUST BRING THEM TO THE OFFICE FOR PROCESSING.  DO NOT GIVE THEM TO YOUR DOCTOR. ° °1. A prescription for pain medication may be given to you upon discharge.  Take your pain medication as prescribed, if needed.  If narcotic pain medicine is not needed, then you may take acetaminophen (Tylenol) or ibuprofen (Advil) as needed. °2. Take your usually prescribed medications unless otherwise directed °3. If you need a refill on your pain medication, please contact your pharmacy.  They will contact our office to request authorization.  Prescriptions will not be filled after 5pm or on week-ends. °4. You should eat very light the first 24 hours after surgery, such as soup, crackers, pudding, etc.  Resume your normal diet the day after surgery. °5. Most patients will experience some swelling and bruising in the breast.  Ice packs and a good support bra will help.  Swelling and bruising can take several days to resolve.  °6. It is common to experience some constipation if taking pain medication after surgery.  Increasing fluid intake and taking a stool softener will usually help or prevent this problem from occurring.  A mild laxative (Milk of Magnesia or Miralax) should be taken according to package directions if there are no bowel movements after 48 hours. °7. Unless discharge instructions indicate otherwise, you may remove your bandages 24-48 hours after surgery, and you may shower at that time.  You may have steri-strips (small skin tapes) in place directly over the incision.  These strips should be left on the skin for 7-10 days.  If your surgeon used skin glue on the incision, you may shower in 24 hours.  The glue will flake off over the  next 2-3 weeks.  Any sutures or staples will be removed at the office during your follow-up visit. °8. ACTIVITIES:  You may resume regular daily activities (gradually increasing) beginning the next day.  Wearing a good support bra or sports bra minimizes pain and swelling.  You may have sexual intercourse when it is comfortable. °a. You may drive when you no longer are taking prescription pain medication, you can comfortably wear a seatbelt, and you can safely maneuver your car and apply brakes. °b. RETURN TO WORK:  ______________________________________________________________________________________ °9. You should see your doctor in the office for a follow-up appointment approximately two weeks after your surgery.  Your doctor’s nurse will typically make your follow-up appointment when she calls you with your pathology report.  Expect your pathology report 2-3 business days after your surgery.  You may call to check if you do not hear from us after three days. °10. OTHER INSTRUCTIONS: _______________________________________________________________________________________________ _____________________________________________________________________________________________________________________________________ °_____________________________________________________________________________________________________________________________________ °_____________________________________________________________________________________________________________________________________ ° °WHEN TO CALL YOUR DOCTOR: °1. Fever over 101.0 °2. Nausea and/or vomiting. °3. Extreme swelling or bruising. °4. Continued bleeding from incision. °5. Increased pain, redness, or drainage from the incision. ° °The clinic staff is available to answer your questions during regular business hours.  Please don’t hesitate to call and ask to speak to one of the nurses for clinical concerns.  If you have a medical emergency, go to the nearest  emergency room or call 911.  A surgeon from [facility] is always on call at the hospital. ° °For further questions, please visit [email or web address]  °

## 2018-03-30 NOTE — Op Note (Addendum)
Patient Name:           Kathy Thornton   Date of Surgery:        03/30/2018  Pre op Diagnosis:      Primary cancer left breast, upper inner quadrant, estrogen receptor positive, HER-2 negative  Post op Diagnosis:    Same  Procedure:                 Left breast lumpectomy with radioactive seed localization  Surgeon:                     Edsel Petrin. Dalbert Batman, M.D., FACS  Assistant:                      OR staff  Operative Indications:    This is a healthy 84 year old woman who is brought to the operating room electively for definitive surgery for her left breast cancer.  She was referred by Dr. Gunnar Bulla Magrinat for consideration of left breast lumpectomy for invasive ductal carcinoma left breast, upper inner quadrant. Her daughter is with her throughout the encounter. [name] is her PCP.        She has no prior history of breast problems. Screening mammograms were performed in January and this revealed a suspicious 8 mm mass in the left breast, 11 o'clock position, 5 cm from the nipple. Axillary ultrasound on the left was normal. Image guided biopsy of the left breast mass shows invasive ductal carcinoma and associated DCIS. ER 90%. PR 80%. Ki-67 at 3%. HER-2 negative. She was started on anastrozole. She is tolerating that fairly well with minimal hot flashes       Genetic testing negative.       She initially declined any consideration for surgery. Dr. Jana Hakim does not think she would benefit from chemotherapy or radiation therapy. She has discussed her situation with family members and has decided to go ahead with lumpectomy. She should not need a sentinel lymph node biopsy.  Family history reveals breast cancer in a paternal first cousin. Colon cancer in a paternal vocal. No ovarian cancer.        She will be scheduled for left breast lumpectomy with radioactive seed localization.  She agrees with this plan.    Operative Findings:       The small cancer was in the upper  inner quadrant of the left breast.  This was fairly high.  The specimen mammogram looks very good with the marker clip and radioactive seed in the center of the specimen.  There were no gross abnormalities by palpation.  Procedure in Detail:          Following the induction of general LMA anesthesia a surgical timeout was performed, the left breast was prepped and draped in a sterile fashion, and intravenous antibiotics were given.  0.5% Marcaine with epinephrine was used as local infiltration anesthetic.       The neoprobe was utilized to identify the location of the radioactive seed and a curvilinear incision was marked and plans in the high upper inner quadrant.  The lumpectomy was performed using electrocautery and the neoprobe.  The specimen was marked with silk sutures and a 6 color ink kit.   Specimen mammogram looked good.  The specimen was marked and sent to the pathology lab where the seed was retrieved.  Hemostasis was excellent and achieved with electrocautery.  The wound was irrigated.  The walls of the lumpectomy cavity were marked  with 5 metal marker clips.  The breast tissues were closed in 2 separate layers with interrupted 3-0 Vicryl and the skin closed with a running subcuticular 4-0 Monocryl and Dermabond.  Clean bandages were placed and the patient taken to PACU in stable condition.  EBL 15 cc.  Counts correct.  Complications none.    Addendum: I logged onto the [employer] and reviewed her prescription medication history     Haig Gerardo M. Dalbert Batman, M.D., FACS General and Minimally Invasive Surgery Breast and Colorectal Surgery  03/30/2018 8:18 AM

## 2018-03-31 ENCOUNTER — Encounter (HOSPITAL_COMMUNITY): Payer: Self-pay | Admitting: General Surgery

## 2018-04-02 NOTE — Progress Notes (Signed)
Inform patient of Pathology report,. Tell her that her left breast cancer was 9 mm in diameter.  This was completely resected with negative margins.  She will not need any further surgery and that is good news. I will discuss this with her in detail at her next office visit  Please let me know that you reached her   hmi

## 2018-04-04 ENCOUNTER — Ambulatory Visit: Payer: Medicare Other | Admitting: Family Medicine

## 2018-04-13 ENCOUNTER — Other Ambulatory Visit: Payer: Self-pay | Admitting: Family Medicine

## 2018-04-13 ENCOUNTER — Telehealth: Payer: Self-pay | Admitting: Family Medicine

## 2018-04-13 DIAGNOSIS — M170 Bilateral primary osteoarthritis of knee: Secondary | ICD-10-CM

## 2018-04-13 NOTE — Telephone Encounter (Signed)
Requesting:Tramadol Contract:none OJJ:[id number] Last Visit:10/04/17 Next Visit:04/18/18 Last Refill:03/21/18  Please Advise

## 2018-04-13 NOTE — Telephone Encounter (Signed)
Medication filled on 04/13/18

## 2018-04-13 NOTE — Telephone Encounter (Signed)
Copied from [place]. Topic: Quick Communication - Rx Refill/Question >> Apr 13, 2018  8:42 AM Robina Ade, Helene Kelp D wrote: Medication: traMADol (ULTRAM) 50 MG tablet Has the patient contacted their pharmacy? Yes (Agent: If no, request that the patient contact the pharmacy for the refill.) Preferred Pharmacy (with phone number or street name): [facility] [place], [address] [place]: Please be advised that RX refills may take up to 3 business days. We ask that you follow-up with your pharmacy.

## 2018-04-13 NOTE — Telephone Encounter (Signed)
Pt is overdue for a visit but has been recently dx with breast cancer and is undergoing treatment Will refill once more Called to talk with her but no answer, no answer machine  [facility] has locked me out

## 2018-04-17 NOTE — Progress Notes (Deleted)
[place] at [facility] [address] [place], [place] [address] [phone]  Date:  04/18/2018   Name:  Kathy Thornton   DOB:  1934-11-29   MRN:  [id number]  PCP:  Darreld Mclean, MD    Chief Complaint: No chief complaint on file.   History of Present Illness:  Kathy Thornton is a 84 y.o. very pleasant female patient who presents with the following:  Following up today- needs a periodic recheck for her tramadol rx  She was also recently dx with breast cancer in January of this year and is being treated per Dr. Jana Hakim.   Patient Active Problem List   Diagnosis Date Noted  . Genetic testing 03/27/2018  . Family history of colon cancer   . Family history of breast cancer   . Malignant neoplasm of upper-inner quadrant of left breast in female, estrogen receptor positive ([place]) 01/02/2018  . Osteoporosis 01/02/2018  . [name] lesion, acute: Per EGD 11/06/2015 11/06/2015  . Erosive gastritis: per EGD 11/05/2105 11/06/2015  . Hemorrhoids; Grade 1 per colonoscopy 11/06/2015 11/06/2015  . Anemia, iron deficiency   . Acute gastric ulcer   . Anemia 11/05/2015  . Symptomatic anemia 11/05/2015  . Heart murmur 11/05/2015  . UTI (lower urinary tract infection) 11/05/2015  . Lower extremity edema 11/05/2015  . Arthritis of both knees 11/04/2015    Past Medical History:  Diagnosis Date  . Breast cancer in female [facility])    Left  . [name] lesion, acute: Per EGD 11/06/2015 11/06/2015  . Cataract   . Erosive gastritis: per EGD 11/05/2105 11/06/2015  . Family history of breast cancer   . Family history of colon cancer   . Heart murmur   . Hemorrhoids; Grade 1 per colonoscopy 11/06/2015 11/06/2015  . Knee pain     Past Surgical History:  Procedure Laterality Date  . BREAST LUMPECTOMY WITH RADIOACTIVE SEED LOCALIZATION Left 03/30/2018   Procedure: RADIOACTIVE SEED GUIDED LEFT BREAST LUMPECTOMY;  Surgeon: Fanny Skates, MD;  Location:  [place];  Service: General;  Laterality: Left;  . COLONOSCOPY WITH PROPOFOL N/A 11/06/2015   Procedure: COLONOSCOPY WITH PROPOFOL;  Surgeon: Ladene Artist, MD;  Location: [facility];  Service: Endoscopy;  Laterality: N/A;  . ESOPHAGOGASTRODUODENOSCOPY (EGD) WITH PROPOFOL N/A 11/06/2015   Procedure: ESOPHAGOGASTRODUODENOSCOPY (EGD) WITH PROPOFOL;  Surgeon: Ladene Artist, MD;  Location: [facility];  Service: Endoscopy;  Laterality: N/A;  . EYE SURGERY      Social History   Tobacco Use  . Smoking status: Never Smoker  . Smokeless tobacco: Never Used  Substance Use Topics  . Alcohol use: No  . Drug use: No    Family History  Problem Relation Age of Onset  . Aneurysm Mother        brain  . Heart disease Father   . Heart disease Brother   . AAA (abdominal aortic aneurysm) Maternal Uncle   . Colon cancer Paternal Uncle        dx >50  . Breast cancer Cousin        pat cousin, dx in her late 32s    Allergies  Allergen Reactions  . Sulfa Antibiotics Hives    Medication list has been reviewed and updated.  Current Outpatient Medications on File Prior to Visit  Medication Sig Dispense Refill  . alendronate (FOSAMAX) 70 MG tablet Take 1 tablet (70 mg total) by mouth every 7 (seven) days. Take with a  full glass of water on an empty stomach. (Patient taking differently: Take 70 mg by mouth every Thursday. Take with a full glass of water on an empty stomach.) 4 tablet 11  . anastrozole (ARIMIDEX) 1 MG tablet Take 1 tablet (1 mg total) by mouth daily. 90 tablet 4  . cholecalciferol (VITAMIN D) 1000 units tablet Take 1 tablet (1,000 Units total) by mouth daily.    . pantoprazole (PROTONIX) 40 MG tablet TAKE 1 TABLET(40 MG) BY MOUTH DAILY 90 tablet 3  . traMADol (ULTRAM) 50 MG tablet TAKE ONE TABLET BY MOUTH EVERY 8 HOURS AS NEEDED FOR MODERATE PAIN. I cannot refill again without office visit 30 tablet 0   No current facility-administered medications on file prior to visit.      Review of Systems:  As per HPI- otherwise negative.   Physical Examination: There were no vitals filed for this visit. There were no vitals filed for this visit. There is no height or weight on file to calculate BMI. Ideal Body Weight:    GEN: WDWN, NAD, Non-toxic, A & O x 3 HEENT: Atraumatic, Normocephalic. Neck supple. No masses, No LAD. Ears and Nose: No external deformity. CV: RRR, No M/G/R. No JVD. No thrill. No extra heart sounds. PULM: CTA B, no wheezes, crackles, rhonchi. No retractions. No resp. distress. No accessory muscle use. ABD: S, NT, ND, +BS. No rebound. No HSM. EXTR: No c/c/e NEURO Normal gait.  PSYCH: Normally interactive. Conversant. Not depressed or anxious appearing.  Calm demeanor.    Assessment and Plan: ***  Signed Lamar Blinks, MD

## 2018-04-18 ENCOUNTER — Ambulatory Visit: Payer: Medicare Other | Admitting: Family Medicine

## 2018-04-23 NOTE — Progress Notes (Signed)
[place] at [facility] [place], [place], [place] [address] [phone]  Date:  04/25/2018   Name:  Kathy Thornton   DOB:  [birth year]   MRN:  [id number]  PCP:  Darreld Mclean, MD    Chief Complaint: Breast Surgery (lumpectomy-4 weeks ago, 6 month follow up)   History of Present Illness:  Kathy Thornton is a 84 y.o. very pleasant female patient who presents with the following:  Periodic follow-up visit today History of gastric ulcer and GI bleed in 2016 Diagnosed with breast cancer in January - lumpectomy done in April She is doing great from a cancer treatment standpoint per her report and that of her daughter who is here with her today, Kathy Thornton  She is taking armidex for 3-5 years- as of yet they do not have any other treatment planned   Surgeon- [place]  She does use tramadol for chronic knee pain and this allows her to move around much more and more comfortable She cannot use NSAIDs due to history of gastritis/ ulcer in 2016  Reviewed [place]:  04/13/2018  1  04/13/2018  Tramadol Hcl 50 Mg Tablet  30 10 Je Cop  [id number]  Wal (2191)  0 15.00 MME Medicare  [place]  03/21/2018  1  03/21/2018  Tramadol Hcl 50 Mg Tablet  30 10 Je Cop  [id number]  Wal (2191)  0 15.00 MME Medicare  [place]  01/25/2018  1  11/17/2017  Tramadol Hcl 50 Mg Tablet  30 10 Je Cop  [id number]  Wal (2191)  2 15.00 MME Medicare  [place]  12/20/2017  1  11/17/2017  Tramadol Hcl 50 Mg Tablet  30 10 Je Cop  [id number]  Wal (2191)  1 15.00 MME Medicare  [place]  11/17/2017  1  11/17/2017  Tramadol Hcl 50 Mg Tablet  30 10 Je Cop  [id number]  Wal (2191)  0 15.00 MME Medicare  [place]  10/20/2017  1  07/25/2017  Tramadol Hcl 50 Mg Tablet  30 10 Je Cop  [id number]  Wal (2191       She is using tramodol for her knee pain, and for her post-op pain - however she has not needed any extra beyond her usual 30 per month  Ortho - [place]  Patient Active Problem List   Diagnosis Date  Noted  . Genetic testing 03/27/2018  . Family history of colon cancer   . Family history of breast cancer   . Malignant neoplasm of upper-inner quadrant of left breast in female, estrogen receptor positive ([place]) 01/02/2018  . Osteoporosis 01/02/2018  . [name] lesion, acute: Per EGD 11/06/2015 11/06/2015  . Erosive gastritis: per EGD 11/05/2105 11/06/2015  . Hemorrhoids; Grade 1 per colonoscopy 11/06/2015 11/06/2015  . Anemia, iron deficiency   . Acute gastric ulcer   . Anemia 11/05/2015  . Symptomatic anemia 11/05/2015  . Heart murmur 11/05/2015  . UTI (lower urinary tract infection) 11/05/2015  . Lower extremity edema 11/05/2015  . Arthritis of both knees 11/04/2015    Past Medical History:  Diagnosis Date  . Breast cancer in female [facility])    Left  . [name] lesion, acute: Per EGD 11/06/2015 11/06/2015  . Cataract   . Erosive gastritis: per EGD 11/05/2105 11/06/2015  . Family history of breast cancer   . Family history of colon cancer   . Heart murmur   . Hemorrhoids; Grade 1 per colonoscopy 11/06/2015 11/06/2015  .  Knee pain     Past Surgical History:  Procedure Laterality Date  . BREAST LUMPECTOMY WITH RADIOACTIVE SEED LOCALIZATION Left 03/30/2018   Procedure: RADIOACTIVE SEED GUIDED LEFT BREAST LUMPECTOMY;  Surgeon: Fanny Skates, MD;  Location: [place];  Service: General;  Laterality: Left;  . COLONOSCOPY WITH PROPOFOL N/A 11/06/2015   Procedure: COLONOSCOPY WITH PROPOFOL;  Surgeon: Ladene Artist, MD;  [facility];  Service: Endoscopy;  Laterality: N/A;  . ESOPHAGOGASTRODUODENOSCOPY (EGD) WITH PROPOFOL N/A 11/06/2015   Procedure: ESOPHAGOGASTRODUODENOSCOPY (EGD) WITH PROPOFOL;  Surgeon: Ladene Artist, MD;  [facility];  Service: Endoscopy;  Laterality: N/A;  . EYE SURGERY      Social History   Tobacco Use  . Smoking status: Never Smoker  . Smokeless tobacco: Never Used  Substance Use Topics  . Alcohol use: No  . Drug use: No    Family History   Problem Relation Age of Onset  . Aneurysm Mother        brain  . Heart disease Father   . Heart disease Brother   . AAA (abdominal aortic aneurysm) Maternal Uncle   . Colon cancer Paternal Uncle        dx >50  . Breast cancer Cousin        pat cousin, dx in her late 65s    Allergies  Allergen Reactions  . Sulfa Antibiotics Hives    Medication list has been reviewed and updated.  Current Outpatient Medications on File Prior to Visit  Medication Sig Dispense Refill  . alendronate (FOSAMAX) 70 MG tablet Take 1 tablet (70 mg total) by mouth every 7 (seven) days. Take with a full glass of water on an empty stomach. (Patient taking differently: Take 70 mg by mouth every Thursday. Take with a full glass of water on an empty stomach.) 4 tablet 11  . anastrozole (ARIMIDEX) 1 MG tablet Take 1 tablet (1 mg total) by mouth daily. 90 tablet 4  . cholecalciferol (VITAMIN D) 1000 units tablet Take 1 tablet (1,000 Units total) by mouth daily.    . pantoprazole (PROTONIX) 40 MG tablet TAKE 1 TABLET(40 MG) BY MOUTH DAILY 90 tablet 3   No current facility-administered medications on file prior to visit.     Review of Systems:  As per HPI- otherwise negative. No fever or chills No CP or SOB No rash No stomach or GI sx    Physical Examination: Vitals:   04/25/18 1344  BP: 130/78  Pulse: 77  Resp: 16  Temp: 98.1 F (36.7 C)  SpO2: 96%   Vitals:   04/25/18 1344  Weight: 146 lb (66.2 kg)  Height: 4\' 11"  (1.499 m)   Body mass index is 29.49 kg/m. Ideal Body Weight: Weight in (lb) to have BMI = 25: 123.5  GEN: WDWN, NAD, Non-toxic, A & O x 3, well appearing older lady who is spry and looks great for her age  61: Atraumatic, Normocephalic. Neck supple. No masses, No LAD. Ears and Nose: No external deformity. CV: RRR, No M/G/R. No JVD. No thrill. No extra heart sounds. PULM: CTA B, no wheezes, crackles, rhonchi. No retractions. No resp. distress. No accessory muscle use. ABD:  S, NT, ND EXTR: No c/c/e NEURO Normal gait.  PSYCH: Normally interactive. Conversant. Not depressed or anxious appearing.  Calm demeanor.  Well healing lumpectomy scar at 10:00 left breast    Assessment and Plan: Malignant neoplasm of upper-inner quadrant of left breast in female, estrogen receptor positive ([place])  Primary  osteoarthritis of both knees - Plan: traMADol (ULTRAM) 50 MG tablet   refilled her tramadol that she uses for chronic knee pain due to OA Discussed recent breast cancer dx and treatment- she is doing well, feels very positive Plan to visit in about 6 months for medication monitor   Signed Lamar Blinks, MD

## 2018-04-25 ENCOUNTER — Ambulatory Visit (INDEPENDENT_AMBULATORY_CARE_PROVIDER_SITE_OTHER): Payer: Medicare Other | Admitting: Family Medicine

## 2018-04-25 ENCOUNTER — Encounter: Payer: Self-pay | Admitting: Family Medicine

## 2018-04-25 VITALS — BP 130/78 | HR 77 | Temp 98.1°F | Resp 16 | Ht 59.0 in | Wt 146.0 lb

## 2018-04-25 DIAGNOSIS — M170 Bilateral primary osteoarthritis of knee: Secondary | ICD-10-CM | POA: Diagnosis not present

## 2018-04-25 DIAGNOSIS — C50212 Malignant neoplasm of upper-inner quadrant of left female breast: Secondary | ICD-10-CM | POA: Diagnosis not present

## 2018-04-25 DIAGNOSIS — Z170 Estrogen receptor positive status [ER+]: Secondary | ICD-10-CM | POA: Diagnosis not present

## 2018-04-25 MED ORDER — TRAMADOL HCL 50 MG PO TABS: ORAL_TABLET | ORAL | 5 refills | Status: DC

## 2018-04-25 NOTE — Patient Instructions (Signed)
It was good to see you today- so glad that you are doing well!  Let me know if I can do anything to help Let's visit again in about 6 months

## 2018-04-26 IMAGING — MG 2D DIGITAL SCREENING BILATERAL MAMMOGRAM WITH CAD AND ADJUNCT TO
6 of 10 series · 6 of 26 positions shown · non-contrast
Comparison: Previous exam(s).

CLINICAL DATA: Screening.

EXAM:
2D DIGITAL SCREENING BILATERAL MAMMOGRAM WITH CAD AND ADJUNCT TOMO

[L XCCL]
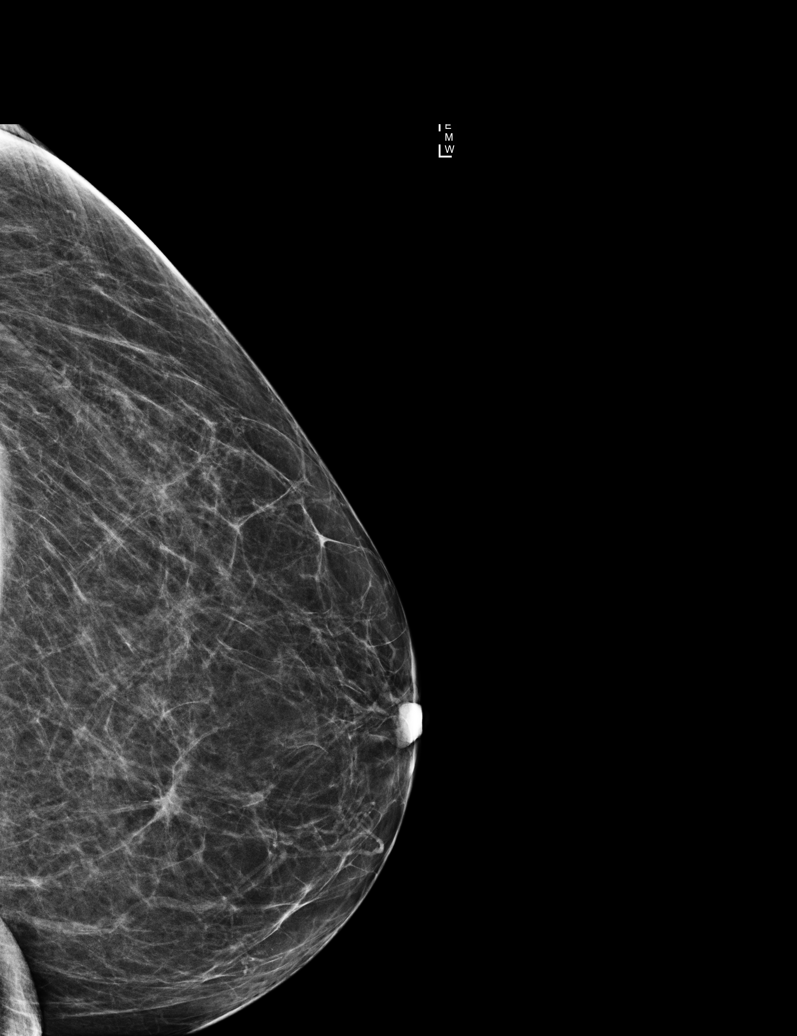

[R XCCL]
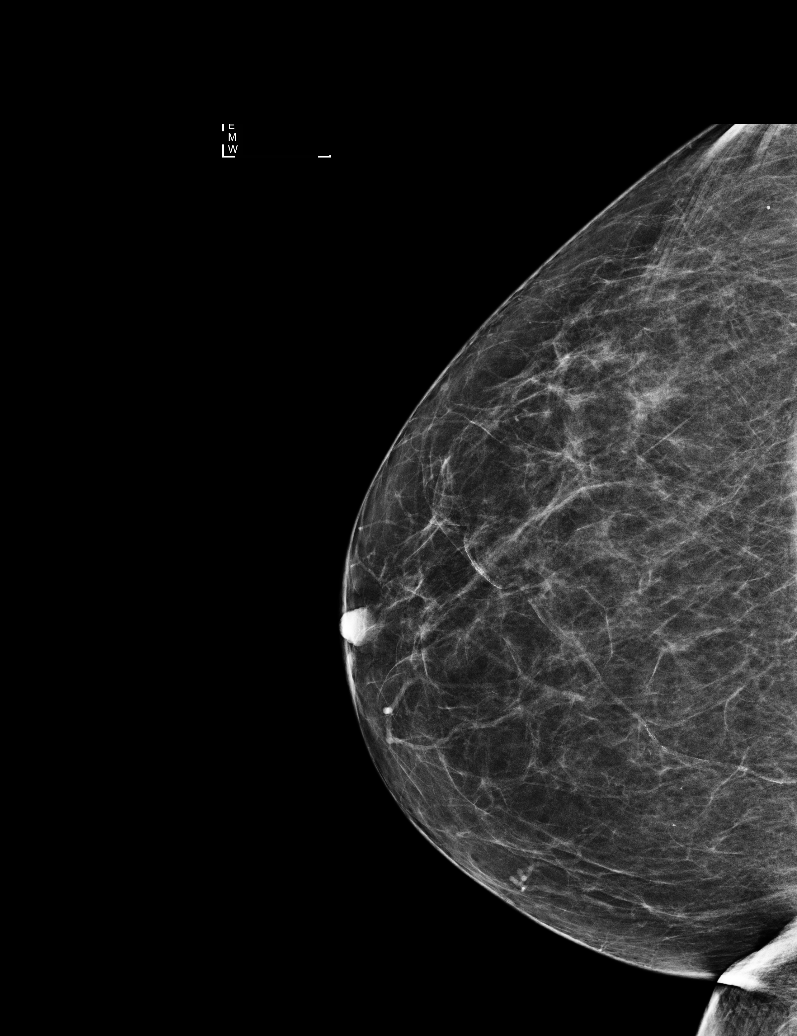

[R CC]
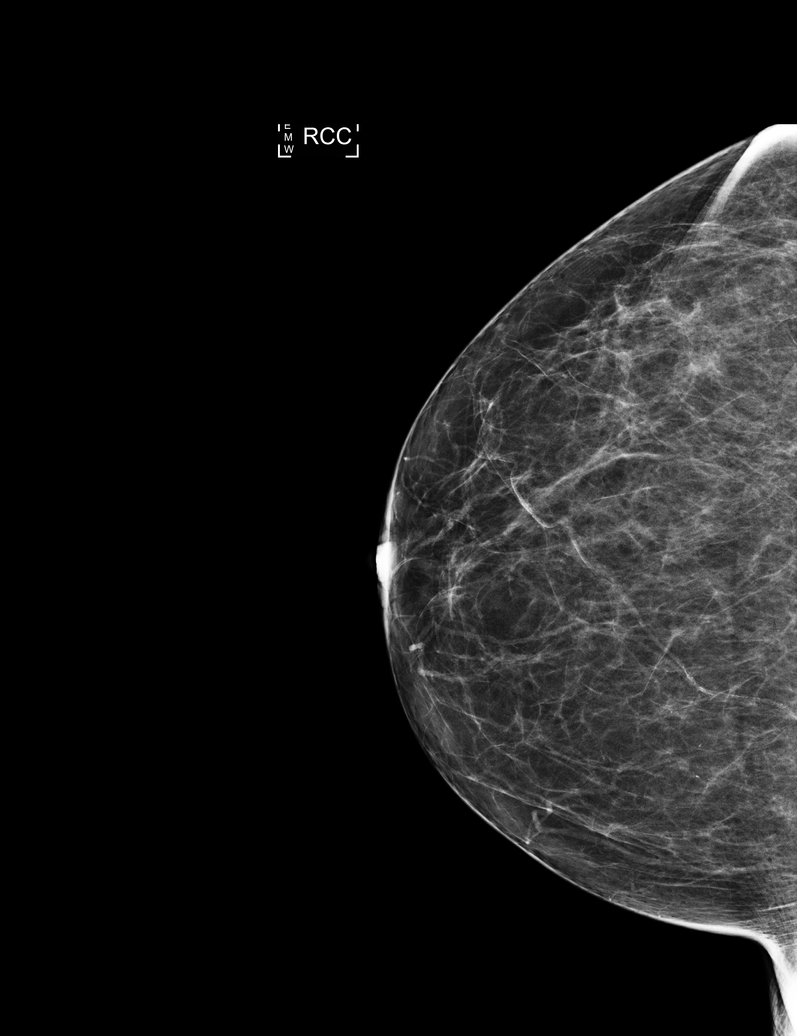

[R MLO]
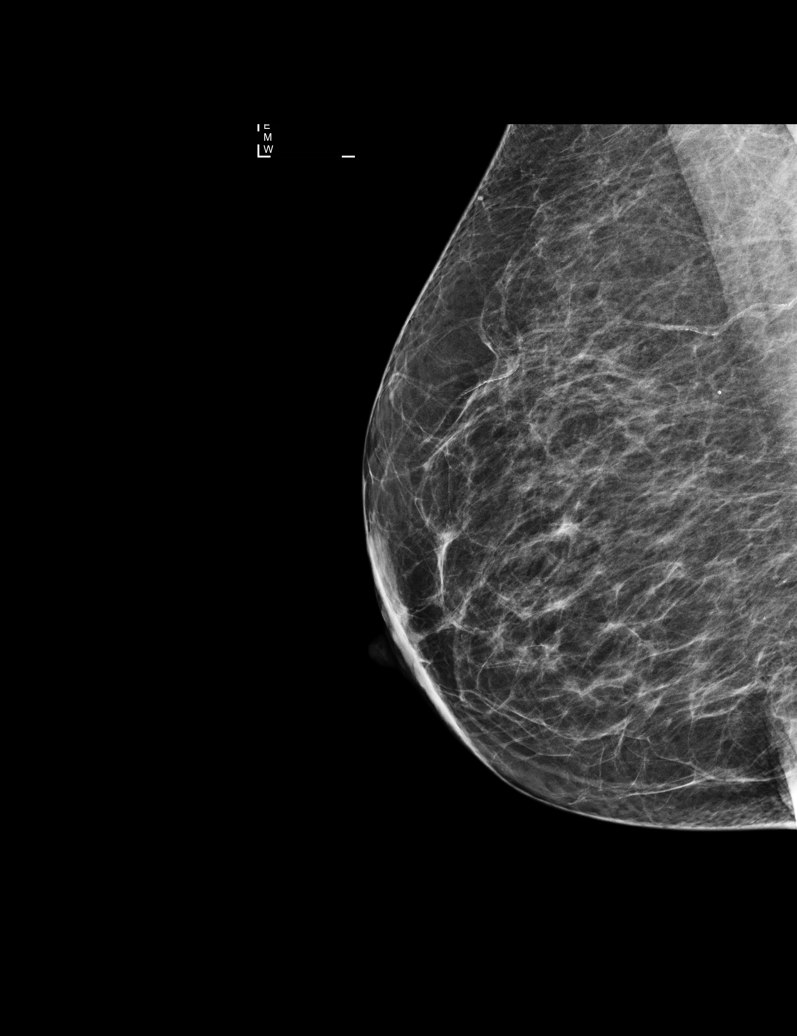

[L CC]
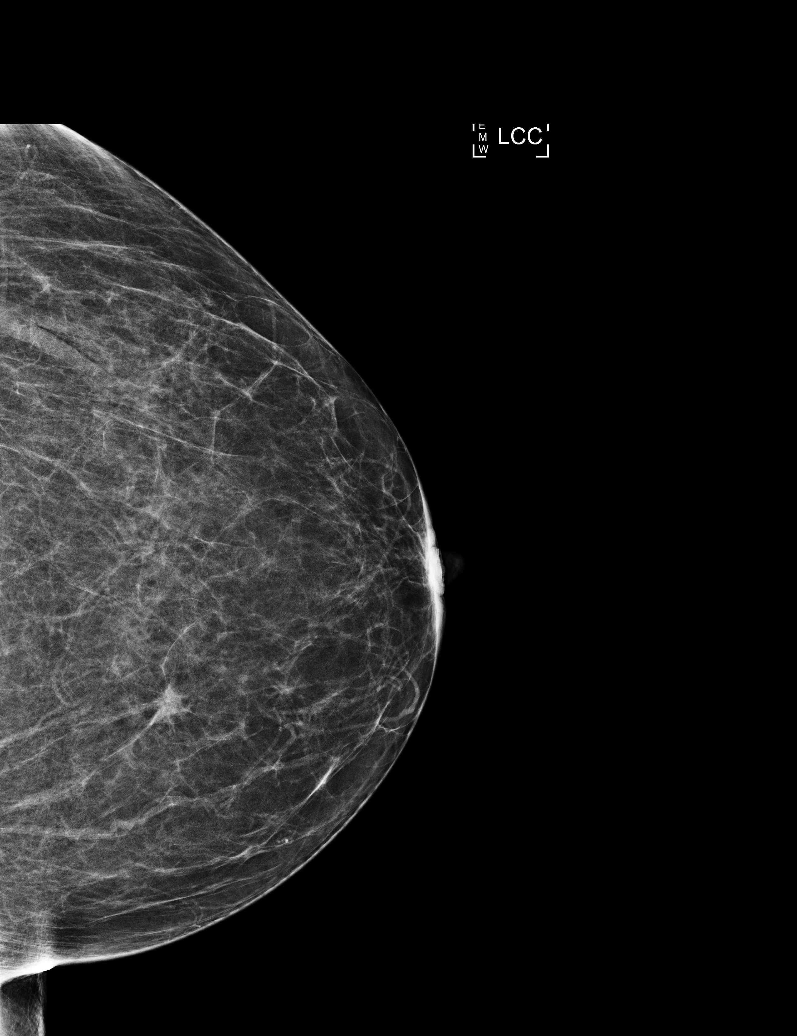

[L MLO]
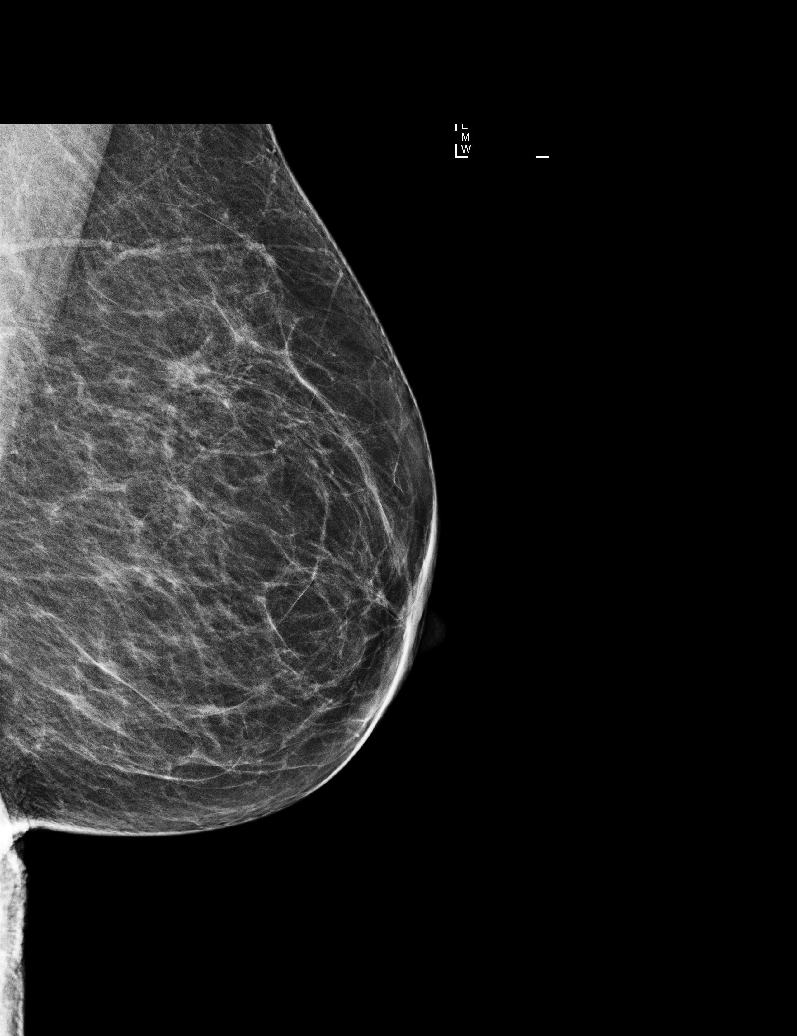

[6 of 26 positions shown; findings below may reference images not displayed]

ACR Breast Density Category b: There are scattered areas of
fibroglandular density.
FINDINGS: In the left breast, a possible mass warrants further evaluation. In
the right breast, no findings suspicious for malignancy.

Images were processed with CAD.
IMPRESSION: Further evaluation is suggested for possible mass in the left
breast.

RECOMMENDATION:
Diagnostic mammogram and possibly ultrasound of the left breast.
(Code:[id number])

The patient will be contacted regarding the findings, and additional
imaging will be scheduled.

BI-RADS CATEGORY  0: Incomplete. Need additional imaging evaluation
and/or prior mammograms for comparison.

## 2018-05-31 DIAGNOSIS — M25551 Pain in right hip: Secondary | ICD-10-CM | POA: Diagnosis not present

## 2018-05-31 DIAGNOSIS — M1611 Unilateral primary osteoarthritis, right hip: Secondary | ICD-10-CM | POA: Diagnosis not present

## 2018-06-10 NOTE — Progress Notes (Signed)
[place]  Telephone:([phone] Fax:(336) [phone]     ID: Jennell Corner DOB: 1933-12-30  MR#: [id number]  DDU#:[id number]  Patient Care Team: Darreld Mclean, MD as PCP - General (Family Medicine) Earlie Server, MD as Consulting Physician (Orthopedic Surgery) Gatha Mayer, MD as Consulting Physician (Gastroenterology) Magrinat, Virgie Dad, MD as Consulting Physician (Oncology) Fanny Skates, MD as Consulting Physician (General Surgery) OTHER MD:  CHIEF COMPLAINT: Estrogen receptor positive breast cancer  CURRENT TREATMENT: anastrozole; alendronate   HISTORY OF CURRENT ILLNESS:  From the original intake note:  Kathy Thornton had routine screening mammography at Dr. Arlyn Dunning on 10/30/2017 showing a possible abnormality in the left breast. She underwent bilateral diagnostic mammography with tomography and left breast ultrasonography at the [facility] on 12/19/2017 showing:  a highly suspicious mass in the left breast at 11 o'clock. No evidence of left axillary lymphadenopathy. Ultrasound targeted to the left breast at 11 o'clock, 5 cm from the nipple demonstrates an ill-defined irregular vascular mass with spiculated margins measuring 8 x 6 x 6 mm. Ultrasound of the left axilla demonstrates multiple normal-appearing lymph nodes.  Accordingly on 12/22/2017 she proceeded to biopsy of the left breast area in question. 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 Prognostic indicators significant for: ER, 90% positive and PR, 80% positive, both with strong staining intensity. Proliferation marker Ki67 at 3%. HER2 negative via [place].  The patient's subsequent history is as detailed below.  INTERVAL HISTORY: [name] returns today for follow-up of her estrogen receptor positive breast cancer. She is  accompanied by her daughter.   The patient continues on anastrozole, which she tolerates well. She does endorse hot flashes which are manageable when she takes the pill in the afternoon.   She also takes alendronate weekly.  She knows how to take it to avoid reflux problems and is having no difficulty with it.  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  The Hereditary Gene Panel offered by [facility] includes sequencing and/or deletion duplication testing of the following 47 genes: APC, ATM, AXIN2, BARD1, BMPR1A, BRCA1, BRCA2, BRIP1, CDH1, CDK4, CDKN2A (p14ARF), CDKN2A (p16INK4a), CHEK2, CTNNA1, DICER1, EPCAM (Deletion/duplication testing only), GREM1 (promoter region deletion/duplication testing only), KIT, MEN1, MLH1, MSH2, MSH3, MSH6, MUTYH, NBN, NF1, NHTL1, PALB2, PDGFRA, PMS2, POLD1, POLE, PTEN, RAD50, RAD51C, RAD51D, SDHB, SDHC, SDHD, SMAD4, SMARCA4. STK11, TP53, TSC1, TSC2, and VHL.  The following genes were evaluated for sequence changes only: SDHA and HOXB13 c.251G>A variant only. The report date is March 23, 2018.  Since her last visit she had a left breast lumpectomy ([id number]) on 03/30/2018, showing invasive ductal carcinoma, grade I/III, spanning 0.9cm. Estrogen Receptor 90% positive, strong, Progesterone Receptor 80% positive, strong and HER-2 negative.    REVIEW OF SYSTEMS: Shanyce is doing well. She tolerated her surgery well. She denied pain, or bleeding. She exercises in the morning before getting out of bed, and will also walk. The patient denies unusual headaches, visual changes, nausea, vomiting, or dizziness. There has been no unusual cough, phlegm production, or pleurisy. This been no change in bowel or bladder habits. The patient denies unexplained fatigue or unexplained weight loss, bleeding, rash, or fever. A detailed review of systems was otherwise noncontributory.    PAST MEDICAL  HISTORY: Past Medical History:  Diagnosis Date  . Breast cancer in female (  [place])    Left  . [name] lesion, acute: Per EGD 11/06/2015 11/06/2015  . Cataract   . Erosive gastritis: per EGD 11/05/2105 11/06/2015  . Family history of breast cancer   . Family history of colon cancer   . Heart murmur   . Hemorrhoids; Grade 1 per colonoscopy 11/06/2015 11/06/2015  . Knee pain     PAST SURGICAL HISTORY: Past Surgical History:  Procedure Laterality Date  . BREAST LUMPECTOMY WITH RADIOACTIVE SEED LOCALIZATION Left 03/30/2018   Procedure: RADIOACTIVE SEED GUIDED LEFT BREAST LUMPECTOMY;  Surgeon: Fanny Skates, MD;  Location: [facility];  Service: General;  Laterality: Left;  . COLONOSCOPY WITH PROPOFOL N/A 11/06/2015   Procedure: COLONOSCOPY WITH PROPOFOL;  Surgeon: Ladene Artist, MD;  Location: [facility];  Service: Endoscopy;  Laterality: N/A;  . ESOPHAGOGASTRODUODENOSCOPY (EGD) WITH PROPOFOL N/A 11/06/2015   Procedure: ESOPHAGOGASTRODUODENOSCOPY (EGD) WITH PROPOFOL;  Surgeon: Ladene Artist, MD;  Location: [facility];  Service: Endoscopy;  Laterality: N/A;  . EYE SURGERY      FAMILY HISTORY Family History  Problem Relation Age of Onset  . Aneurysm Mother        brain  . Heart disease Father   . Heart disease Brother   . AAA (abdominal aortic aneurysm) Maternal Uncle   . Colon cancer Paternal Uncle        dx >50  . Breast cancer Cousin        pat cousin, dx in her late 72s  Her mother had a brain aneurysm at age 76 that she passed from. Her father lived with cardiac issues and passed at age 22. She has 2 brothers and 3 sisters. She had one paternal cousin that had breast cancer at age 43. One uncle had a prior hx of colon cancer. She denies any family hx of ovarian cancer.   GYNECOLOGIC HISTORY:  No LMP recorded. Patient is postmenopausal. Menarche: 84 years old Age at first live birth: 84 years old [place] P: [id number] LMP: 84 years old Contraceptive:No HRT: No     SOCIAL HISTORY: She  retired from [employer], [employer], [employer], and [employer] at age 47. She will have been married 29 years in August, 2019. Her husband's name is Elenore Rota and he is a Horticulturist, commercial. She lives at home with her husband. She has no pets, but reports that she keeps her granddaughter when the girl is not in school. She has 36 grandchildren and great grandchildren combined. She is a Psychologist, forensic.      ADVANCED DIRECTIVES: She is comfortable with her husband being her healthcare power of attorney   HEALTH MAINTENANCE: Social History   Tobacco Use  . Smoking status: Never Smoker  . Smokeless tobacco: Never Used  Substance Use Topics  . Alcohol use: No  . Drug use: No     Colonoscopy: 11/05/2015/[facility]  PAP: Last one >10 years ago per patient   Bone density: 10/30/2017 showed a T score of -2.5   Allergies  Allergen Reactions  . Sulfa Antibiotics Hives    Current Outpatient Medications  Medication Sig Dispense Refill  . alendronate (FOSAMAX) 70 MG tablet Take 1 tablet (70 mg total) by mouth every 7 (seven) days. Take with a full glass of water on an empty stomach. (Patient taking differently: Take 70 mg by mouth every Thursday. Take with a full glass of water on an empty stomach.) 4 tablet 11  . anastrozole (ARIMIDEX) 1 MG tablet Take 1 tablet (1 mg total) by mouth  daily. 90 tablet 4  . cholecalciferol (VITAMIN D) 1000 units tablet Take 1 tablet (1,000 Units total) by mouth daily.    . pantoprazole (PROTONIX) 40 MG tablet TAKE 1 TABLET(40 MG) BY MOUTH DAILY 90 tablet 3  . traMADol (ULTRAM) 50 MG tablet TAKE ONE TABLET BY MOUTH EVERY 8 HOURS AS NEEDED FOR MODERATE PAIN. 30 tablet 5   No current facility-administered medications for this visit.     OBJECTIVE: Elderly white woman who appears stated age  47:   06/11/18 1121  BP: (!) 150/59  Pulse: 67  Resp: 18  Temp: 98.7 F (37.1 C)  SpO2: 99%     Body mass index is 28.86 kg/m.   Wt Readings from Last 3 Encounters:  06/11/18  142 lb 14.4 oz (64.8 kg)  04/25/18 146 lb (66.2 kg)  03/30/18 146 lb (66.2 kg)      ECOG FS:1 - Symptomatic but completely ambulatory  Sclerae unicteric, pupils round and equal Oropharynx clear and moist No cervical or supraclavicular adenopathy Lungs no rales or rhonchi Heart regular rate and rhythm Abd soft, nontender, positive bowel sounds MSK no focal spinal tenderness, no upper extremity lymphedema; she walks with canes in both hands  Neuro: nonfocal, well oriented, appropriate affect Breasts: The right breast is unremarkable.  The left breast is status post lumpectomy.  There is no evidence of local recurrence.  Both axillae are benign.  LAB RESULTS:  CMP     Component Value Date/Time   NA 140 06/11/2018 1043   K 4.1 06/11/2018 1043   CL 104 06/11/2018 1043   CO2 28 06/11/2018 1043   GLUCOSE 91 06/11/2018 1043   BUN 13 06/11/2018 1043   CREATININE 1.01 (H) 06/11/2018 1043   CREATININE 1.03 03/14/2018 1441   CREATININE 1.11 (H) 01/19/2016 1126   CALCIUM 9.8 06/11/2018 1043   PROT 7.2 06/11/2018 1043   ALBUMIN 4.3 06/11/2018 1043   AST 14 (L) 06/11/2018 1043   AST 13 03/14/2018 1441   ALT 14 06/11/2018 1043   ALT 10 03/14/2018 1441   ALKPHOS 70 06/11/2018 1043   BILITOT 0.5 06/11/2018 1043   BILITOT 0.4 03/14/2018 1441   GFRNONAA 50 (L) 06/11/2018 1043   GFRNONAA 49 (L) 03/14/2018 1441   GFRAA 58 (L) 06/11/2018 1043   GFRAA 57 (L) 03/14/2018 1441    No results found for: TOTALPROTELP, ALBUMINELP, A1GS, A2GS, BETS, BETA2SER, GAMS, MSPIKE, SPEI  No results found for: KPAFRELGTCHN, LAMBDASER, KAPLAMBRATIO  Lab Results  Component Value Date   WBC 8.3 06/11/2018   NEUTROABS 5.5 06/11/2018   HGB 13.0 06/11/2018   HCT 39.3 06/11/2018   MCV 91.7 06/11/2018   PLT 196 06/11/2018    @LASTCHEMISTRY @  No results found for: LABCA2  No components found for: BRAXEN407  No results for input(s): INR in the last 168 hours.  No results found for: LABCA2  No  results found for: WKG881  No results found for: JSR159  No results found for: YVO592  No results found for: CA2729  No components found for: HGQUANT  No results found for: CEA1 / No results found for: CEA1   No results found for: AFPTUMOR  No results found for: CHROMOGRNA  No results found for: PSA1  Appointment on 06/11/2018  Component Date Value Ref Range Status  . Sodium 06/11/2018 140  135 - 145 mmol/L Final   Please note reference intervals were recently updated.  . Potassium 06/11/2018 4.1  3.5 - 5.1 mmol/L Final  . Chloride  06/11/2018 104  98 - 111 mmol/L Final  . CO2 06/11/2018 28  22 - 32 mmol/L Final  . Glucose, Bld 06/11/2018 91  70 - 99 mg/dL Final  . BUN 06/11/2018 13  8 - 23 mg/dL Final   Please note change in reference range.  . Creatinine, Ser 06/11/2018 1.01* 0.44 - 1.00 mg/dL Final  . Calcium 06/11/2018 9.8  8.9 - 10.3 mg/dL Final  . Total Protein 06/11/2018 7.2  6.5 - 8.1 g/dL Final  . Albumin 06/11/2018 4.3  3.5 - 5.0 g/dL Final  . AST 06/11/2018 14* 15 - 41 U/L Final  . ALT 06/11/2018 14  0 - 44 U/L Final  . Alkaline Phosphatase 06/11/2018 70  38 - 126 U/L Final  . Total Bilirubin 06/11/2018 0.5  0.3 - 1.2 mg/dL Final  . GFR calc non Af Amer 06/11/2018 50* >60 mL/min Final  . GFR calc Af Amer 06/11/2018 58* >60 mL/min Final   Comment: (NOTE) The eGFR has been calculated using the CKD EPI equation. This calculation has not been validated in all clinical situations. eGFR's persistently <60 mL/min signify possible Chronic Kidney Disease.   Georgiann Hahn gap 06/11/2018 8  5 - 15 Final   Performed at [facility], [place] [address], [place], [place] [address]  . WBC 06/11/2018 8.3  3.9 - 10.3 K/uL Final  . RBC 06/11/2018 4.28  3.70 - 5.45 MIL/uL Final  . Hemoglobin 06/11/2018 13.0  11.6 - 15.9 g/dL Final  . HCT 06/11/2018 39.3  34.8 - 46.6 % Final  . MCV 06/11/2018 91.7  79.5 - 101.0 fL Final  . MCH 06/11/2018 30.5  25.1 - 34.0 pg  Final  . MCHC 06/11/2018 33.2  31.5 - 36.0 g/dL Final  . RDW 06/11/2018 15.4* 11.2 - 14.5 % Final  . Platelets 06/11/2018 196  145 - 400 K/uL Final  . Neutrophils Relative % 06/11/2018 65  % Final  . Neutro Abs 06/11/2018 5.5  1.5 - 6.5 K/uL Final  . Lymphocytes Relative 06/11/2018 25  % Final  . Lymphs Abs 06/11/2018 2.1  0.9 - 3.3 K/uL Final  . Monocytes Relative 06/11/2018 7  % Final  . Monocytes Absolute 06/11/2018 0.6  0.1 - 0.9 K/uL Final  . Eosinophils Relative 06/11/2018 2  % Final  . Eosinophils Absolute 06/11/2018 0.1  0.0 - 0.5 K/uL Final  . Basophils Relative 06/11/2018 1  % Final  . Basophils Absolute 06/11/2018 0.1  0.0 - 0.1 K/uL Final   Performed at [facility], [address] [place], [place] [address]    (this displays the last labs from the last 3 days)  No results found for: TOTALPROTELP, ALBUMINELP, A1GS, A2GS, BETS, BETA2SER, GAMS, MSPIKE, SPEI (this displays SPEP labs)  No results found for: KPAFRELGTCHN, LAMBDASER, KAPLAMBRATIO (kappa/lambda light chains)  No results found for: HGBA, HGBA2QUANT, HGBFQUANT, HGBSQUAN (Hemoglobinopathy evaluation)   No results found for: LDH  Lab Results  Component Value Date   IRON 14 (L) 11/05/2015   TIBC 545 (H) 11/05/2015   IRONPCTSAT 3 (L) 11/05/2015   (Iron and TIBC)  Lab Results  Component Value Date   FERRITIN 3 (L) 11/05/2015    Urinalysis    Component Value Date/Time   COLORURINE YELLOW 02/07/2016 1540   APPEARANCEUR CLOUDY (A) 02/07/2016 1540   LABSPEC 1.016 02/07/2016 1540   PHURINE 6.0 02/07/2016 1540   GLUCOSEU NEGATIVE 02/07/2016 1540   HGBUR NEGATIVE 02/07/2016 [place] 02/07/2016  1540   BILIRUBINUR negative 11/04/2015 1418   KETONESUR 15 (A) 02/07/2016 1540   PROTEINUR NEGATIVE 02/07/2016 1540   UROBILINOGEN 0.2 11/04/2015 1418   NITRITE NEGATIVE 02/07/2016 1540   LEUKOCYTESUR NEGATIVE 02/07/2016 1540     STUDIES: Pathology results  reviewed with the patient  ELIGIBLE FOR AVAILABLE RESEARCH PROTOCOL:no  ASSESSMENT: 84 y.o. [place] woman status post left breast upper inner quadrant biopsy 12/22/2017 for a clinical T1b N0, stage IA invasive ductal carcinoma, grade 1, estrogen and progesterone receptor positive, HER-2 not amplified, with an MIB-1 of 3%.  (1) genetics testing 03/23/2018 through the hereditary Gene Panel offered by [facility] found no deleterious mutations in APC, ATM, AXIN2, BARD1, BMPR1A, BRCA1, BRCA2, BRIP1, CDH1, CDK4, CDKN2A (p14ARF), CDKN2A (p16INK4a), CHEK2, CTNNA1, DICER1, EPCAM (Deletion/duplication testing only), GREM1 (promoter region deletion/duplication testing only), KIT, MEN1, MLH1, MSH2, MSH3, MSH6, MUTYH, NBN, NF1, NHTL1, PALB2, PDGFRA, PMS2, POLD1, POLE, PTEN, RAD50, RAD51C, RAD51D, SDHB, SDHC, SDHD, SMAD4, SMARCA4. STK11, TP53, TSC1, TSC2, and VHL.  The following genes were evaluated for sequence changes only: SDHA and HOXB13 c.251G>A variant only.   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  We reviewed her genetics which are also benign.  I reassured her that the variant of uncertain significance likely will be reclassified as benign and some point in the future.  She is tolerating anastrozole well and also the alendronate.  The plan is to continue both those drugs for the next 5 years.  She will have a repeat bone density next April  She does see her surgeon in November or December of this year and she will see me again in May of next year.   I commended her on her excellent length  activity and exercise program and her carefulness and avoiding falls.  He knows to call for any other issues that may develop before the next visit.  Magrinat, Virgie Dad, MD  06/11/18 11:51 AM Medical Oncology and Hematology [facility] [address] [place], [place] [address] Tel. [phone]    Fax. [phone]    I, Margit Banda am acting as a scribe for Chauncey Cruel, MD.   I, Lurline Del MD, have reviewed the above documentation for accuracy and completeness, and I agree with the above.

## 2018-06-11 ENCOUNTER — Telehealth: Payer: Self-pay | Admitting: Oncology

## 2018-06-11 ENCOUNTER — Inpatient Hospital Stay: Payer: Medicare Other | Attending: Oncology

## 2018-06-11 ENCOUNTER — Inpatient Hospital Stay (HOSPITAL_BASED_OUTPATIENT_CLINIC_OR_DEPARTMENT_OTHER): Payer: Medicare Other | Admitting: Oncology

## 2018-06-11 VITALS — BP 150/59 | HR 67 | Temp 98.7°F | Resp 18 | Ht 59.0 in | Wt 142.9 lb

## 2018-06-11 DIAGNOSIS — Z79899 Other long term (current) drug therapy: Secondary | ICD-10-CM | POA: Diagnosis not present

## 2018-06-11 DIAGNOSIS — Z803 Family history of malignant neoplasm of breast: Secondary | ICD-10-CM | POA: Insufficient documentation

## 2018-06-11 DIAGNOSIS — Z170 Estrogen receptor positive status [ER+]: Secondary | ICD-10-CM

## 2018-06-11 DIAGNOSIS — Z79811 Long term (current) use of aromatase inhibitors: Secondary | ICD-10-CM | POA: Diagnosis not present

## 2018-06-11 DIAGNOSIS — C50212 Malignant neoplasm of upper-inner quadrant of left female breast: Secondary | ICD-10-CM | POA: Insufficient documentation

## 2018-06-11 DIAGNOSIS — Z1379 Encounter for other screening for genetic and chromosomal anomalies: Secondary | ICD-10-CM

## 2018-06-11 DIAGNOSIS — Z800 Family history of malignant neoplasm of digestive organs: Secondary | ICD-10-CM | POA: Insufficient documentation

## 2018-06-11 LAB — CBC WITH DIFFERENTIAL/PLATELET
BASOS PCT: 1 %
Basophils Absolute: 0.1 10*3/uL (ref 0.0–0.1)
Eosinophils Absolute: 0.1 10*3/uL (ref 0.0–0.5)
Eosinophils Relative: 2 %
HEMATOCRIT: 39.3 % (ref 34.8–46.6)
HEMOGLOBIN: 13 g/dL (ref 11.6–15.9)
Lymphocytes Relative: 25 %
Lymphs Abs: 2.1 10*3/uL (ref 0.9–3.3)
MCH: 30.5 pg (ref 25.1–34.0)
MCHC: 33.2 g/dL (ref 31.5–36.0)
MCV: 91.7 fL (ref 79.5–101.0)
Monocytes Absolute: 0.6 10*3/uL (ref 0.1–0.9)
Monocytes Relative: 7 %
NEUTROS ABS: 5.5 10*3/uL (ref 1.5–6.5)
NEUTROS PCT: 65 %
Platelets: 196 10*3/uL (ref 145–400)
RBC: 4.28 MIL/uL (ref 3.70–5.45)
RDW: 15.4 % — ABNORMAL HIGH (ref 11.2–14.5)
WBC: 8.3 10*3/uL (ref 3.9–10.3)

## 2018-06-11 LAB — COMPREHENSIVE METABOLIC PANEL
ALK PHOS: 70 U/L (ref 38–126)
ALT: 14 U/L (ref 0–44)
ANION GAP: 8 (ref 5–15)
AST: 14 U/L — ABNORMAL LOW (ref 15–41)
Albumin: 4.3 g/dL (ref 3.5–5.0)
BUN: 13 mg/dL (ref 8–23)
CALCIUM: 9.8 mg/dL (ref 8.9–10.3)
CO2: 28 mmol/L (ref 22–32)
Chloride: 104 mmol/L (ref 98–111)
Creatinine, Ser: 1.01 mg/dL — ABNORMAL HIGH (ref 0.44–1.00)
GFR calc non Af Amer: 50 mL/min — ABNORMAL LOW (ref >60)
GFR, EST AFRICAN AMERICAN: 58 mL/min — AB (ref >60)
Glucose, Bld: 91 mg/dL (ref 70–99)
POTASSIUM: 4.1 mmol/L (ref 3.5–5.1)
SODIUM: 140 mmol/L (ref 135–145)
Total Bilirubin: 0.5 mg/dL (ref 0.3–1.2)
Total Protein: 7.2 g/dL (ref 6.5–8.1)

## 2018-06-11 NOTE — Telephone Encounter (Signed)
Gave avs and calendar ° °

## 2018-06-18 ENCOUNTER — Ambulatory Visit: Payer: Medicare Other | Admitting: Oncology

## 2018-06-18 ENCOUNTER — Other Ambulatory Visit: Payer: Medicare Other

## 2018-08-14 DIAGNOSIS — M170 Bilateral primary osteoarthritis of knee: Secondary | ICD-10-CM | POA: Diagnosis not present

## 2018-10-04 ENCOUNTER — Other Ambulatory Visit: Payer: Self-pay | Admitting: Family Medicine

## 2018-10-04 DIAGNOSIS — Z8719 Personal history of other diseases of the digestive system: Secondary | ICD-10-CM

## 2018-10-04 DIAGNOSIS — Z8711 Personal history of peptic ulcer disease: Secondary | ICD-10-CM

## 2018-10-15 ENCOUNTER — Ambulatory Visit (INDEPENDENT_AMBULATORY_CARE_PROVIDER_SITE_OTHER): Payer: Medicare Other

## 2018-10-15 DIAGNOSIS — Z23 Encounter for immunization: Secondary | ICD-10-CM | POA: Diagnosis not present

## 2018-10-15 NOTE — Progress Notes (Signed)
[place]

## 2018-11-02 ENCOUNTER — Telehealth: Payer: Self-pay | Admitting: Family Medicine

## 2018-11-02 ENCOUNTER — Other Ambulatory Visit: Payer: Self-pay | Admitting: Family Medicine

## 2018-11-02 DIAGNOSIS — M170 Bilateral primary osteoarthritis of knee: Secondary | ICD-10-CM

## 2018-11-02 NOTE — Telephone Encounter (Signed)
Copied from [place] [id number]. Topic: General - Other >> Nov 02, 2018 12:27 PM Janace Aris A wrote: Medication: traMADol (ULTRAM) 50 MG tablet  Has the patient contacted their pharmacy? Yes, was instructed to call the office.    Preferred Pharmacy (with phone number or street name): [facility], [place] [phone] (Phone) [phone] (Fax)    Agent: Please be advised that RX refills may take up to 3 business days. We ask that you follow-up with your pharmacy.

## 2018-11-03 MED ORDER — TRAMADOL HCL 50 MG PO TABS: ORAL_TABLET | ORAL | 1 refills | Status: DC

## 2018-11-03 NOTE — Telephone Encounter (Signed)
Checked [employer]- she is ok for a refill but does need an OV Refilled Routed to my CMA to schedule a visit

## 2018-11-04 NOTE — Progress Notes (Signed)
[place] [facility] [address] [place], [place] [address] [phone]  Date:  11/08/2018   Name:  Kathy Thornton   DOB:  [birth year]   MRN:  [id number]  PCP:  Darreld Mclean, MD    Chief Complaint: No chief complaint on file.   History of Present Illness:  Kathy Thornton is a 84 y.o. very pleasant female patient who presents with the following: Last seen by myself in May  Here today for a CPE History of breast cancer, osteoporosis, prior history of gastric ulcer with resultant bleed and anemia, chronic arthritis pain Here today with [name] who contributes to the history.  Kathy Thornton is not here today She is not taking her fosamax- last took in July.  Stopped due to SE. Would be interested in prolia for her osteoporosis instead  She is treated with tramadol for chronic pain- she uses this for her knee pain.  She takes it once a day, on occasion BID if she is doing a lot of walking She does get periodic cortisone shots in her knees and hip per her orthopedist at [facility] ortho  Per oncology note from this past July: ASSESSMENT: 84 y.o. [place] woman status post left breast upper inner quadrant biopsy 12/22/2017 for a clinical T1b N0, stage IA invasive ductal carcinoma, grade 1, estrogen and progesterone receptor positive, HER-2 not amplified, with an MIB-1 of 3%. (1) genetics testing 03/23/2018 through the hereditary Gene Panel offered by [facility] found no deleterious mutations(2) patient initially refused surgery or radiation, but underwent left lumpectomy 03/30/2018 for a pT1b cN0, stage IA invasive ductal carcinoma, grade 1, with negative margins (3) anastrozole started 01/02/2018             (a) bone density 10/30/2017 shows a T score of -2.5             (b) alendronate started January 2019  Could do a lipid panel for her Immun: all UTD except ?shingrix   Her sleep is pretty good except for hot flashes from her  anastrazole  However she is able to live with these   BP Readings from Last 3 Encounters:  11/08/18 (!) 148/78  06/11/18 (!) 150/59  04/25/18 130/78     Patient Active Problem List   Diagnosis Date Noted  . Genetic testing 03/27/2018  . Family history of colon cancer   . Family history of breast cancer   . Malignant neoplasm of upper-inner quadrant of left breast in female, estrogen receptor positive ([place]) 01/02/2018  . Osteoporosis 01/02/2018  . [name] lesion, acute: Per EGD 11/06/2015 11/06/2015  . Erosive gastritis: per EGD 11/05/2105 11/06/2015  . Hemorrhoids; Grade 1 per colonoscopy 11/06/2015 11/06/2015  . Anemia, iron deficiency   . Acute gastric ulcer   . Anemia 11/05/2015  . Symptomatic anemia 11/05/2015  . Heart murmur 11/05/2015  . UTI (lower urinary tract infection) 11/05/2015  . Lower extremity edema 11/05/2015  . Arthritis of both knees 11/04/2015    Past Medical History:  Diagnosis Date  . Breast cancer in female [facility])    Left  . [name] lesion, acute: Per EGD 11/06/2015 11/06/2015  . Cataract   . Erosive gastritis: per EGD 11/05/2105 11/06/2015  . Family history of breast cancer   . Family history of colon cancer   . Heart murmur   . Hemorrhoids; Grade 1 per colonoscopy 11/06/2015 11/06/2015  . Knee pain     Past  Surgical History:  Procedure Laterality Date  . BREAST LUMPECTOMY WITH RADIOACTIVE SEED LOCALIZATION Left 03/30/2018   Procedure: RADIOACTIVE SEED GUIDED LEFT BREAST LUMPECTOMY;  Surgeon: Fanny Skates, MD;  Location: [place];  Service: General;  Laterality: Left;  . COLONOSCOPY WITH PROPOFOL N/A 11/06/2015   Procedure: COLONOSCOPY WITH PROPOFOL;  Surgeon: Ladene Artist, MD;  Location: [facility];  Service: Endoscopy;  Laterality: N/A;  . ESOPHAGOGASTRODUODENOSCOPY (EGD) WITH PROPOFOL N/A 11/06/2015   Procedure: ESOPHAGOGASTRODUODENOSCOPY (EGD) WITH PROPOFOL;  Surgeon: Ladene Artist, MD;  Location: [facility];  Service: Endoscopy;  Laterality:  N/A;  . EYE SURGERY      Social History   Tobacco Use  . Smoking status: Never Smoker  . Smokeless tobacco: Never Used  Substance Use Topics  . Alcohol use: No  . Drug use: No    Family History  Problem Relation Age of Onset  . Aneurysm Mother        brain  . Heart disease Father   . Heart disease Brother   . AAA (abdominal aortic aneurysm) Maternal Uncle   . Colon cancer Paternal Uncle        dx >50  . Breast cancer Cousin        pat cousin, dx in her late 63s    Allergies  Allergen Reactions  . Sulfa Antibiotics Hives    Medication list has been reviewed and updated.  Current Outpatient Medications on File Prior to Visit  Medication Sig Dispense Refill  . anastrozole (ARIMIDEX) 1 MG tablet Take 1 tablet (1 mg total) by mouth daily. 90 tablet 4  . Cinnamon 500 MG capsule Take 1 capsule (500 mg total) by mouth daily.    . pantoprazole (PROTONIX) 40 MG tablet TAKE 1 TABLET(40 MG) BY MOUTH DAILY 90 tablet 1  . traMADol (ULTRAM) 50 MG tablet TAKE ONE TABLET BY MOUTH EVERY 8 HOURS AS NEEDED FOR MODERATE PAIN. 30 tablet 1  . Turmeric 500 MG CAPS Take 1 capsule by mouth 1 day or 1 dose.    . Vitamin D, Ergocalciferol, 50 MCG (2000 UT) CAPS Take by mouth.     No current facility-administered medications on file prior to visit.     Review of Systems:  As per HPI- otherwise negative.   Physical Examination: Vitals:   11/08/18 1325  BP: (!) 148/78  Pulse: 74  Resp: 16  Temp: 98.6 F (37 C)  SpO2: 98%   Vitals:   11/08/18 1325  Weight: 144 lb (65.3 kg)  Height: 4' 11"  (1.499 m)   Body mass index is 29.08 kg/m. Ideal Body Weight: Weight in (lb) to have BMI = 25: 123.5  GEN: WDWN, NAD, Non-toxic, A & O x 3, looks well, normal weight  HEENT: Atraumatic, Normocephalic. Neck supple. No masses, No LAD.  Bilateral TM wnl, oropharynx normal.  PEERL,EOMI.   Ears and Nose: No external deformity. CV: RRR, No M/G/R. No JVD. No thrill. No extra heart sounds. PULM:  CTA B, no wheezes, crackles, rhonchi. No retractions. No resp. distress. No accessory muscle use. ABD: S, NT, ND. No rebound. No HSM. EXTR: No c/c/e NEURO Normal gait except for kyphosis which is baseline.  She uses a cane  PSYCH: Normally interactive. Conversant. Not depressed or anxious appearing.  Calm demeanor.    Assessment and Plan: Physical exam  Primary osteoarthritis of both knees  Ductal carcinoma in situ (DCIS) of left breast  Age-related osteoporosis without current pathological fracture  History of gastric ulcer  Screening  for hyperlipidemia - Plan: Lipid panel  Following up for a CPE today Denyse Amass is feeling very well overall. She does continue to suffer from knee and hip pain, which is being treated by orthopedist with steroid injections.  We are also using tramadol as needed.   Her breast cancer is being managed by Dr. Jana Hakim.  She is on oral medication for the next 5 years.  She is tolerating the medication well. She does have osteoporosis, and has not tolerated Fosamax.  Will touch base with her oncologist about possibly using Prolia. No sign of recurrent gastric ulcer. Needs a lipid panel which we will give her today. Discussed shingles vaccine with her, and she plans to get at her drugstore later in the spring.   Signed Lamar Blinks, MD  Per her oncologist prolia is ok from his standpoint.  Will discuss with pt when I get her lipid panel in  Letter to pt, message to Raquel Sarna about getting prolia approved for this pt  Results for orders placed or performed in visit on 11/08/18  Lipid panel  Result Value Ref Range   Cholesterol 159 0 - 200 mg/dL   Triglycerides 65.0 0.0 - 149.0 mg/dL   HDL 68.50 >39.00 mg/dL   VLDL 13.0 0.0 - 40.0 mg/dL   LDL Cholesterol 78 0 - 99 mg/dL   Total CHOL/HDL Ratio 2    NonHDL 90.76

## 2018-11-04 NOTE — Telephone Encounter (Signed)
Pt has an appt this week 

## 2018-11-07 ENCOUNTER — Encounter: Payer: Medicare Other | Admitting: Family Medicine

## 2018-11-07 ENCOUNTER — Ambulatory Visit: Payer: Medicare Other | Admitting: *Deleted

## 2018-11-07 NOTE — Progress Notes (Signed)
Subjective:   Kathy Thornton is a 84 y.o. female who presents for Medicare Annual (Subsequent) preventive examination.  Here with Kieth Brightly her daughter. Pt enjoys crossword puzzles and bible reading.   Review of Systems: No ROS.  Medicare Wellness Visit. Additional risk factors are reflected in the social history. Cardiac Risk Factors include: advanced age (>62men, >75 women) Sleep patterns: no issues Home Safety/Smoke Alarms: Feels safe in home. Smoke alarms in place. Lives in one story home with husband. A few stairs going in. Uses handrails.    Female:    Mammo- utd       Dexa scan- utd           Objective:     Vitals: BP (!) 148/78 (BP Location: Right Arm, Patient Position: Sitting, Cuff Size: Small)   Pulse 74   Ht 4\' 11"  (1.499 m)   Wt 144 lb (65.3 kg)   SpO2 98%   BMI 29.08 kg/m   Body mass index is 29.08 kg/m.  Advanced Directives 11/08/2018 03/27/2018 03/23/2017 02/07/2016 11/04/2015  Does Patient Have a Medical Advance Directive? No No No No No  Would patient like information on creating a medical advance directive? No - Patient declined No - Patient declined No - Patient declined No - patient declined information -    Tobacco Social History   Tobacco Use  Smoking Status Never Smoker  Smokeless Tobacco Never Used     Counseling given: Not Answered   Clinical Intake:     Pain : No/denies pain                 Past Medical History:  Diagnosis Date  . Breast cancer in female [facility])    Left  . [name] lesion, acute: Per EGD 11/06/2015 11/06/2015  . Cataract   . Erosive gastritis: per EGD 11/05/2105 11/06/2015  . Family history of breast cancer   . Family history of colon cancer   . Heart murmur   . Hemorrhoids; Grade 1 per colonoscopy 11/06/2015 11/06/2015  . Knee pain    Past Surgical History:  Procedure Laterality Date  . BREAST LUMPECTOMY WITH RADIOACTIVE SEED LOCALIZATION Left 03/30/2018   Procedure: RADIOACTIVE SEED GUIDED LEFT BREAST  LUMPECTOMY;  Surgeon: Fanny Skates, MD;  Location: [place];  Service: General;  Laterality: Left;  . COLONOSCOPY WITH PROPOFOL N/A 11/06/2015   Procedure: COLONOSCOPY WITH PROPOFOL;  Surgeon: Ladene Artist, MD;  [facility];  Service: Endoscopy;  Laterality: N/A;  . ESOPHAGOGASTRODUODENOSCOPY (EGD) WITH PROPOFOL N/A 11/06/2015   Procedure: ESOPHAGOGASTRODUODENOSCOPY (EGD) WITH PROPOFOL;  Surgeon: Ladene Artist, MD;  [facility];  Service: Endoscopy;  Laterality: N/A;  . EYE SURGERY     Family History  Problem Relation Age of Onset  . Aneurysm Mother        brain  . Heart disease Father   . Heart disease Brother   . AAA (abdominal aortic aneurysm) Maternal Uncle   . Colon cancer Paternal Uncle        dx >50  . Breast cancer Cousin        pat cousin, dx in her late 40s   Social History   Socioeconomic History  . Marital status: Married    Spouse name: Not on file  . Number of children: Not on file  . Years of education: Not on file  . Highest education level: Not on file  Occupational History  . Not on file  Social Needs  . Financial resource strain:  Not on file  . Food insecurity:    Worry: Not on file    Inability: Not on file  . Transportation needs:    Medical: Not on file    Non-medical: Not on file  Tobacco Use  . Smoking status: Never Smoker  . Smokeless tobacco: Never Used  Substance and Sexual Activity  . Alcohol use: No  . Drug use: No  . Sexual activity: Not on file  Lifestyle  . Physical activity:    Days per week: Not on file    Minutes per session: Not on file  . Stress: Not on file  Relationships  . Social connections:    Talks on phone: Not on file    Gets together: Not on file    Attends religious service: Not on file    Active member of club or organization: Not on file    Attends meetings of clubs or organizations: Not on file    Relationship status: Not on file  Other Topics Concern  . Not on file  Social History  Narrative  . Not on file    Outpatient Encounter Medications as of 11/08/2018  Medication Sig  . anastrozole (ARIMIDEX) 1 MG tablet Take 1 tablet (1 mg total) by mouth daily.  . Cinnamon 500 MG capsule Take 1 capsule (500 mg total) by mouth daily.  . pantoprazole (PROTONIX) 40 MG tablet TAKE 1 TABLET(40 MG) BY MOUTH DAILY  . traMADol (ULTRAM) 50 MG tablet TAKE ONE TABLET BY MOUTH EVERY 8 HOURS AS NEEDED FOR MODERATE PAIN.  Marland Kitchen Turmeric 500 MG CAPS Take 1 capsule by mouth 1 day or 1 dose.  . Vitamin D, Ergocalciferol, 50 MCG (2000 UT) CAPS Take by mouth.  . [DISCONTINUED] alendronate (FOSAMAX) 70 MG tablet Take 1 tablet (70 mg total) by mouth every 7 (seven) days. Take with a full glass of water on an empty stomach. (Patient taking differently: Take 70 mg by mouth every Thursday. Take with a full glass of water on an empty stomach.)  . [DISCONTINUED] cholecalciferol (VITAMIN D) 1000 units tablet Take 1 tablet (1,000 Units total) by mouth daily.   No facility-administered encounter medications on file as of 11/08/2018.     Activities of Daily Living In your present state of health, do you have any difficulty performing the following activities: 11/08/2018 03/27/2018  Hearing? N N  Vision? N N  Difficulty concentrating or making decisions? N N  Walking or climbing stairs? N Y  Dressing or bathing? N N  Doing errands, shopping? N N  Preparing Food and eating ? N -  Using the Toilet? N -  In the past six months, have you accidently leaked urine? N -  Do you have problems with loss of bowel control? N -  Managing your Medications? N -  Managing your Finances? N -  Housekeeping or managing your Housekeeping? N -  Some recent data might be hidden    Patient Care Team: Copland, Gay Filler, MD as PCP - General (Family Medicine) Earlie Server, MD as Consulting Physician (Orthopedic Surgery) Gatha Mayer, MD as Consulting Physician (Gastroenterology) Magrinat, Virgie Dad, MD as Consulting  Physician (Oncology) Fanny Skates, MD as Consulting Physician (General Surgery)    Assessment:   This is a routine wellness examination for [place]. Physical assessment deferred to PCP.  Exercise Activities and Dietary recommendations Current Exercise Habits: Home exercise routine, Type of exercise: walking, Time (Minutes): 30, Frequency (Times/Week): 5, Weekly Exercise (Minutes/Week): 150, Intensity: Mild, Exercise limited  by: None identified Diet (meal preparation, eat out, water intake, caffeinated beverages, dairy products, fruits and vegetables): in general, a "healthy" diet  , well balanced, on average, 2 meals per day and healthy snacks      Goals    . Increase physical activity     Per pt---states she would like to be 135lb       Fall Risk Fall Risk  11/08/2018 11/08/2018 04/25/2018 03/23/2017 11/04/2015  Falls in the past year? 0 0 No No No    Depression Screen PHQ 2/9 Scores 11/08/2018 11/08/2018 04/25/2018 03/23/2017  PHQ - 2 Score 0 0 0 0     Cognitive Function Ad8 score reviewed for issues:  Issues making decisions:no  Less interest in hobbies / activities:no  Repeats questions, stories (family complaining):no  Trouble using ordinary gadgets (microwave, computer, phone):no  Forgets the month or year: no  Mismanaging finances: no  Remembering appts:no  Daily problems with thinking and/or memory:no Ad8 score is=0     MMSE - Mini Mental State Exam 03/23/2017  Orientation to time 5  Orientation to Place 5  Registration 3  Attention/ Calculation 2  Recall 2  Language- name 2 objects 2  Language- repeat 1  Language- follow 3 step command 3  Language- read & follow direction 1  Write a sentence 1  Copy design 1  Total score 26        Immunization History  Administered Date(s) Administered  . Influenza, High Dose Seasonal PF 08/22/2016, 10/04/2017, 10/15/2018  . Influenza,inj,Quad PF,6+ Mos 11/04/2015  . Pneumococcal Conjugate-13 11/04/2015  .  Pneumococcal Polysaccharide-23 10/04/2017  . Tdap 06/09/2012   Screening Tests Health Maintenance  Topic Date Due  . TETANUS/TDAP  06/09/2022  . INFLUENZA VACCINE  Completed  . DEXA SCAN  Completed  . PNA vac Low Risk Adult  Completed       Plan:    Please schedule your next medicare wellness visit with me in 1 yr.  Continue to eat heart healthy diet (full of fruits, vegetables, whole grains, lean protein, water--limit salt, fat, and sugar intake) and increase physical activity as tolerated.  Continue doing brain stimulating activities (puzzles, reading, adult coloring books, staying active) to keep memory sharp.   Bring a copy of your living will and/or healthcare power of attorney to your next office visit.   I have personally reviewed and noted the following in the patient's chart:   . Medical and social history . Use of alcohol, tobacco or illicit drugs  . Current medications and supplements . Functional ability and status . Nutritional status . Physical activity . Advanced directives . List of other physicians . Hospitalizations, surgeries, and ER visits in previous 12 months . Vitals . Screenings to include cognitive, depression, and falls . Referrals and appointments  In addition, I have reviewed and discussed with patient certain preventive protocols, quality metrics, and best practice recommendations. A written personalized care plan for preventive services as well as general preventive health recommendations were provided to patient.     Shela Nevin, [place]  11/08/2018

## 2018-11-08 ENCOUNTER — Encounter: Payer: Self-pay | Admitting: Family Medicine

## 2018-11-08 ENCOUNTER — Encounter: Payer: Self-pay | Admitting: *Deleted

## 2018-11-08 ENCOUNTER — Ambulatory Visit (INDEPENDENT_AMBULATORY_CARE_PROVIDER_SITE_OTHER): Payer: Medicare Other | Admitting: *Deleted

## 2018-11-08 ENCOUNTER — Ambulatory Visit (INDEPENDENT_AMBULATORY_CARE_PROVIDER_SITE_OTHER): Payer: Medicare Other | Admitting: Family Medicine

## 2018-11-08 VITALS — BP 148/78 | HR 74 | Ht 59.0 in | Wt 144.0 lb

## 2018-11-08 VITALS — BP 148/78 | HR 74 | Temp 98.6°F | Resp 16 | Ht 59.0 in | Wt 144.0 lb

## 2018-11-08 DIAGNOSIS — Z13220 Encounter for screening for lipoid disorders: Secondary | ICD-10-CM

## 2018-11-08 DIAGNOSIS — M810 Age-related osteoporosis without current pathological fracture: Secondary | ICD-10-CM

## 2018-11-08 DIAGNOSIS — D0512 Intraductal carcinoma in situ of left breast: Secondary | ICD-10-CM | POA: Diagnosis not present

## 2018-11-08 DIAGNOSIS — M170 Bilateral primary osteoarthritis of knee: Secondary | ICD-10-CM | POA: Diagnosis not present

## 2018-11-08 DIAGNOSIS — Z8719 Personal history of other diseases of the digestive system: Secondary | ICD-10-CM

## 2018-11-08 DIAGNOSIS — Z0000 Encounter for general adult medical examination without abnormal findings: Secondary | ICD-10-CM | POA: Diagnosis not present

## 2018-11-08 DIAGNOSIS — Z8711 Personal history of peptic ulcer disease: Secondary | ICD-10-CM

## 2018-11-08 LAB — LIPID PANEL
CHOL/HDL RATIO: 2
CHOLESTEROL: 159 mg/dL (ref 0–200)
HDL: 68.5 mg/dL (ref >39.00)
LDL Cholesterol: 78 mg/dL (ref 0–99)
NONHDL: 90.76
Triglycerides: 65 mg/dL (ref 0.0–149.0)
VLDL: 13 mg/dL (ref 0.0–40.0)

## 2018-11-08 NOTE — Patient Instructions (Signed)
Good to see you today!  We will check just a cholesterol panel for you today Continue to monitor your BP at home- our goal is less than 140/90 Let me know if you are running higher than this number at home I will touch base with Dr. Jana Hakim about an alternative med for your bone density    Health Maintenance for Postmenopausal Women Menopause is a normal process in which your reproductive ability comes to an end. This process happens gradually over a span of months to years, usually between the ages of 49 and 65. Menopause is complete when you have missed 12 consecutive menstrual periods. It is important to talk with your health care provider about some of the most common conditions that affect postmenopausal women, such as heart disease, cancer, and bone loss (osteoporosis). Adopting a healthy lifestyle and getting preventive care can help to promote your health and wellness. Those actions can also lower your chances of developing some of these common conditions. What should I know about menopause? During menopause, you may experience a number of symptoms, such as:  Moderate-to-severe hot flashes.  Night sweats.  Decrease in sex drive.  Mood swings.  Headaches.  Tiredness.  Irritability.  Memory problems.  Insomnia.  Choosing to treat or not to treat menopausal changes is an individual decision that you make with your health care provider. What should I know about hormone replacement therapy and supplements? Hormone therapy products are effective for treating symptoms that are associated with menopause, such as hot flashes and night sweats. Hormone replacement carries certain risks, especially as you become older. If you are thinking about using estrogen or estrogen with progestin treatments, discuss the benefits and risks with your health care provider. What should I know about heart disease and stroke? Heart disease, heart attack, and stroke become more likely as you age. This  may be due, in part, to the hormonal changes that your body experiences during menopause. These can affect how your body processes dietary fats, triglycerides, and cholesterol. Heart attack and stroke are both medical emergencies. There are many things that you can do to help prevent heart disease and stroke:  Have your blood pressure checked at least every 1-2 years. High blood pressure causes heart disease and increases the risk of stroke.  If you are 83-34 years old, ask your health care provider if you should take aspirin to prevent a heart attack or a stroke.  Do not use any tobacco products, including cigarettes, chewing tobacco, or electronic cigarettes. If you need help quitting, ask your health care provider.  It is important to eat a healthy diet and maintain a healthy weight. ? Be sure to include plenty of vegetables, fruits, low-fat dairy products, and lean protein. ? Avoid eating foods that are high in solid fats, added sugars, or salt (sodium).  Get regular exercise. This is one of the most important things that you can do for your health. ? Try to exercise for at least 150 minutes each week. The type of exercise that you do should increase your heart rate and make you sweat. This is known as moderate-intensity exercise. ? Try to do strengthening exercises at least twice each week. Do these in addition to the moderate-intensity exercise.  Know your numbers.Ask your health care provider to check your cholesterol and your blood glucose. Continue to have your blood tested as directed by your health care provider.  What should I know about cancer screening? There are several types of cancer. Take  the following steps to reduce your risk and to catch any cancer development as early as possible. Breast Cancer  Practice breast self-awareness. ? This means understanding how your breasts normally appear and feel. ? It also means doing regular breast self-exams. Let your health care  provider know about any changes, no matter how small.  If you are 67 or older, have a clinician do a breast exam (clinical breast exam or CBE) every year. Depending on your age, family history, and medical history, it may be recommended that you also have a yearly breast X-ray (mammogram).  If you have a family history of breast cancer, talk with your health care provider about genetic screening.  If you are at high risk for breast cancer, talk with your health care provider about having an MRI and a mammogram every year.  Breast cancer (BRCA) gene test is recommended for women who have family members with BRCA-related cancers. Results of the assessment will determine the need for genetic counseling and BRCA1 and for BRCA2 testing. BRCA-related cancers include these types: ? Breast. This occurs in males or females. ? Ovarian. ? Tubal. This may also be called fallopian tube cancer. ? Cancer of the abdominal or pelvic lining (peritoneal cancer). ? Prostate. ? Pancreatic.  Cervical, Uterine, and Ovarian Cancer Your health care provider may recommend that you be screened regularly for cancer of the pelvic organs. These include your ovaries, uterus, and vagina. This screening involves a pelvic exam, which includes checking for microscopic changes to the surface of your cervix (Pap test).  For women ages 21-65, health care providers may recommend a pelvic exam and a Pap test every three years. For women ages 98-65, they may recommend the Pap test and pelvic exam, combined with testing for human papilloma virus (HPV), every five years. Some types of HPV increase your risk of cervical cancer. Testing for HPV may also be done on women of any age who have unclear Pap test results.  Other health care providers may not recommend any screening for nonpregnant women who are considered low risk for pelvic cancer and have no symptoms. Ask your health care provider if a screening pelvic exam is right for  you.  If you have had past treatment for cervical cancer or a condition that could lead to cancer, you need Pap tests and screening for cancer for at least 20 years after your treatment. If Pap tests have been discontinued for you, your risk factors (such as having a new sexual partner) need to be reassessed to determine if you should start having screenings again. Some women have medical problems that increase the chance of getting cervical cancer. In these cases, your health care provider may recommend that you have screening and Pap tests more often.  If you have a family history of uterine cancer or ovarian cancer, talk with your health care provider about genetic screening.  If you have vaginal bleeding after reaching menopause, tell your health care provider.  There are currently no reliable tests available to screen for ovarian cancer.  Lung Cancer Lung cancer screening is recommended for adults 49-69 years old who are at high risk for lung cancer because of a history of smoking. A yearly low-dose CT scan of the lungs is recommended if you:  Currently smoke.  Have a history of at least 30 pack-years of smoking and you currently smoke or have quit within the past 15 years. A pack-year is smoking an average of one pack of cigarettes per  day for one year.  Yearly screening should:  Continue until it has been 15 years since you quit.  Stop if you develop a health problem that would prevent you from having lung cancer treatment.  Colorectal Cancer  This type of cancer can be detected and can often be prevented.  Routine colorectal cancer screening usually begins at age 56 and continues through age 34.  If you have risk factors for colon cancer, your health care provider may recommend that you be screened at an earlier age.  If you have a family history of colorectal cancer, talk with your health care provider about genetic screening.  Your health care provider may also recommend  using home test kits to check for hidden blood in your stool.  A small camera at the end of a tube can be used to examine your colon directly (sigmoidoscopy or colonoscopy). This is done to check for the earliest forms of colorectal cancer.  Direct examination of the colon should be repeated every 5-10 years until age 66. However, if early forms of precancerous polyps or small growths are found or if you have a family history or genetic risk for colorectal cancer, you may need to be screened more often.  Skin Cancer  Check your skin from head to toe regularly.  Monitor any moles. Be sure to tell your health care provider: ? About any new moles or changes in moles, especially if there is a change in a mole's shape or color. ? If you have a mole that is larger than the size of a pencil eraser.  If any of your family members has a history of skin cancer, especially at a young age, talk with your health care provider about genetic screening.  Always use sunscreen. Apply sunscreen liberally and repeatedly throughout the day.  Whenever you are outside, protect yourself by wearing long sleeves, pants, a wide-brimmed hat, and sunglasses.  What should I know about osteoporosis? Osteoporosis is a condition in which bone destruction happens more quickly than new bone creation. After menopause, you may be at an increased risk for osteoporosis. To help prevent osteoporosis or the bone fractures that can happen because of osteoporosis, the following is recommended:  If you are 36-50 years old, get at least 1,000 mg of calcium and at least 600 mg of vitamin D per day.  If you are older than age 65 but younger than age 58, get at least 1,200 mg of calcium and at least 600 mg of vitamin D per day.  If you are older than age 45, get at least 1,200 mg of calcium and at least 800 mg of vitamin D per day.  Smoking and excessive alcohol intake increase the risk of osteoporosis. Eat foods that are rich in  calcium and vitamin D, and do weight-bearing exercises several times each week as directed by your health care provider. What should I know about how menopause affects my mental health? Depression may occur at any age, but it is more common as you become older. Common symptoms of depression include:  Low or sad mood.  Changes in sleep patterns.  Changes in appetite or eating patterns.  Feeling an overall lack of motivation or enjoyment of activities that you previously enjoyed.  Frequent crying spells.  Talk with your health care provider if you think that you are experiencing depression. What should I know about immunizations? It is important that you get and maintain your immunizations. These include:  Tetanus, diphtheria, and pertussis (  Tdap) booster vaccine.  Influenza every year before the flu season begins.  Pneumonia vaccine.  Shingles vaccine.  Your health care provider may also recommend other immunizations. This information is not intended to replace advice given to you by your health care provider. Make sure you discuss any questions you have with your health care provider. Document Released: 01/13/2006 Document Revised: 06/10/2016 Document Reviewed: 08/25/2015 Elsevier Interactive Patient Education  2018 [employer].

## 2018-11-08 NOTE — Progress Notes (Signed)
I have reviewed the above MWE by Ms. Vevelyn Royals and agree with her documentation Denny Peon MD

## 2018-11-08 NOTE — Patient Instructions (Signed)
Please schedule your next medicare wellness visit with me in 1 yr.  Continue to eat heart healthy diet (full of fruits, vegetables, whole grains, lean protein, water--limit salt, fat, and sugar intake) and increase physical activity as tolerated.  Continue doing brain stimulating activities (puzzles, reading, adult coloring books, staying active) to keep memory sharp.   Bring a copy of your living will and/or healthcare power of attorney to your next office visit.   Kathy Thornton , Thank you for taking time to come for your Medicare Wellness Visit. I appreciate your ongoing commitment to your health goals. Please review the following plan we discussed and let me know if I can assist you in the future.   These are the goals we discussed: Goals    . Increase physical activity     Per pt---states she would like to be 135lb       This is a list of the screening recommended for you and due dates:  Health Maintenance  Topic Date Due  . Tetanus Vaccine  06/09/2022  . Flu Shot  Completed  . DEXA scan (bone density measurement)  Completed  . Pneumonia vaccines  Completed    Health Maintenance for Postmenopausal Women Menopause is a normal process in which your reproductive ability comes to an end. This process happens gradually over a span of months to years, usually between the ages of 82 and 51. Menopause is complete when you have missed 12 consecutive menstrual periods. It is important to talk with your health care provider about some of the most common conditions that affect postmenopausal women, such as heart disease, cancer, and bone loss (osteoporosis). Adopting a healthy lifestyle and getting preventive care can help to promote your health and wellness. Those actions can also lower your chances of developing some of these common conditions. What should I know about menopause? During menopause, you may experience a number of symptoms, such as:  Moderate-to-severe hot flashes.  Night  sweats.  Decrease in sex drive.  Mood swings.  Headaches.  Tiredness.  Irritability.  Memory problems.  Insomnia.  Choosing to treat or not to treat menopausal changes is an individual decision that you make with your health care provider. What should I know about hormone replacement therapy and supplements? Hormone therapy products are effective for treating symptoms that are associated with menopause, such as hot flashes and night sweats. Hormone replacement carries certain risks, especially as you become older. If you are thinking about using estrogen or estrogen with progestin treatments, discuss the benefits and risks with your health care provider. What should I know about heart disease and stroke? Heart disease, heart attack, and stroke become more likely as you age. This may be due, in part, to the hormonal changes that your body experiences during menopause. These can affect how your body processes dietary fats, triglycerides, and cholesterol. Heart attack and stroke are both medical emergencies. There are many things that you can do to help prevent heart disease and stroke:  Have your blood pressure checked at least every 1-2 years. High blood pressure causes heart disease and increases the risk of stroke.  If you are 82-39 years old, ask your health care provider if you should take aspirin to prevent a heart attack or a stroke.  Do not use any tobacco products, including cigarettes, chewing tobacco, or electronic cigarettes. If you need help quitting, ask your health care provider.  It is important to eat a healthy diet and maintain a healthy weight. ?  Be sure to include plenty of vegetables, fruits, low-fat dairy products, and lean protein. ? Avoid eating foods that are high in solid fats, added sugars, or salt (sodium).  Get regular exercise. This is one of the most important things that you can do for your health. ? Try to exercise for at least 150 minutes each week.  The type of exercise that you do should increase your heart rate and make you sweat. This is known as moderate-intensity exercise. ? Try to do strengthening exercises at least twice each week. Do these in addition to the moderate-intensity exercise.  Know your numbers.Ask your health care provider to check your cholesterol and your blood glucose. Continue to have your blood tested as directed by your health care provider.  What should I know about cancer screening? There are several types of cancer. Take the following steps to reduce your risk and to catch any cancer development as early as possible. Breast Cancer  Practice breast self-awareness. ? This means understanding how your breasts normally appear and feel. ? It also means doing regular breast self-exams. Let your health care provider know about any changes, no matter how small.  If you are 82 or older, have a clinician do a breast exam (clinical breast exam or CBE) every year. Depending on your age, family history, and medical history, it may be recommended that you also have a yearly breast X-ray (mammogram).  If you have a family history of breast cancer, talk with your health care provider about genetic screening.  If you are at high risk for breast cancer, talk with your health care provider about having an MRI and a mammogram every year.  Breast cancer (BRCA) gene test is recommended for women who have family members with BRCA-related cancers. Results of the assessment will determine the need for genetic counseling and BRCA1 and for BRCA2 testing. BRCA-related cancers include these types: ? Breast. This occurs in males or females. ? Ovarian. ? Tubal. This may also be called fallopian tube cancer. ? Cancer of the abdominal or pelvic lining (peritoneal cancer). ? Prostate. ? Pancreatic.  Cervical, Uterine, and Ovarian Cancer Your health care provider may recommend that you be screened regularly for cancer of the pelvic  organs. These include your ovaries, uterus, and vagina. This screening involves a pelvic exam, which includes checking for microscopic changes to the surface of your cervix (Pap test).  For women ages 21-65, health care providers may recommend a pelvic exam and a Pap test every three years. For women ages 35-65, they may recommend the Pap test and pelvic exam, combined with testing for human papilloma virus (HPV), every five years. Some types of HPV increase your risk of cervical cancer. Testing for HPV may also be done on women of any age who have unclear Pap test results.  Other health care providers may not recommend any screening for nonpregnant women who are considered low risk for pelvic cancer and have no symptoms. Ask your health care provider if a screening pelvic exam is right for you.  If you have had past treatment for cervical cancer or a condition that could lead to cancer, you need Pap tests and screening for cancer for at least 20 years after your treatment. If Pap tests have been discontinued for you, your risk factors (such as having a new sexual partner) need to be reassessed to determine if you should start having screenings again. Some women have medical problems that increase the chance of getting cervical cancer.  In these cases, your health care provider may recommend that you have screening and Pap tests more often.  If you have a family history of uterine cancer or ovarian cancer, talk with your health care provider about genetic screening.  If you have vaginal bleeding after reaching menopause, tell your health care provider.  There are currently no reliable tests available to screen for ovarian cancer.  Lung Cancer Lung cancer screening is recommended for adults 63-3 years old who are at high risk for lung cancer because of a history of smoking. A yearly low-dose CT scan of the lungs is recommended if you:  Currently smoke.  Have a history of at least 30 pack-years of  smoking and you currently smoke or have quit within the past 15 years. A pack-year is smoking an average of one pack of cigarettes per day for one year.  Yearly screening should:  Continue until it has been 15 years since you quit.  Stop if you develop a health problem that would prevent you from having lung cancer treatment.  Colorectal Cancer  This type of cancer can be detected and can often be prevented.  Routine colorectal cancer screening usually begins at age 4 and continues through age 53.  If you have risk factors for colon cancer, your health care provider may recommend that you be screened at an earlier age.  If you have a family history of colorectal cancer, talk with your health care provider about genetic screening.  Your health care provider may also recommend using home test kits to check for hidden blood in your stool.  A small camera at the end of a tube can be used to examine your colon directly (sigmoidoscopy or colonoscopy). This is done to check for the earliest forms of colorectal cancer.  Direct examination of the colon should be repeated every 5-10 years until age 43. However, if early forms of precancerous polyps or small growths are found or if you have a family history or genetic risk for colorectal cancer, you may need to be screened more often.  Skin Cancer  Check your skin from head to toe regularly.  Monitor any moles. Be sure to tell your health care provider: ? About any new moles or changes in moles, especially if there is a change in a mole's shape or color. ? If you have a mole that is larger than the size of a pencil eraser.  If any of your family members has a history of skin cancer, especially at a young age, talk with your health care provider about genetic screening.  Always use sunscreen. Apply sunscreen liberally and repeatedly throughout the day.  Whenever you are outside, protect yourself by wearing long sleeves, pants, a wide-brimmed  hat, and sunglasses.  What should I know about osteoporosis? Osteoporosis is a condition in which bone destruction happens more quickly than new bone creation. After menopause, you may be at an increased risk for osteoporosis. To help prevent osteoporosis or the bone fractures that can happen because of osteoporosis, the following is recommended:  If you are 39-25 years old, get at least 1,000 mg of calcium and at least 600 mg of vitamin D per day.  If you are older than age 7 but younger than age 53, get at least 1,200 mg of calcium and at least 600 mg of vitamin D per day.  If you are older than age 48, get at least 1,200 mg of calcium and at least 800 mg of vitamin  D per day.  Smoking and excessive alcohol intake increase the risk of osteoporosis. Eat foods that are rich in calcium and vitamin D, and do weight-bearing exercises several times each week as directed by your health care provider. What should I know about how menopause affects my mental health? Depression may occur at any age, but it is more common as you become older. Common symptoms of depression include:  Low or sad mood.  Changes in sleep patterns.  Changes in appetite or eating patterns.  Feeling an overall lack of motivation or enjoyment of activities that you previously enjoyed.  Frequent crying spells.  Talk with your health care provider if you think that you are experiencing depression. What should I know about immunizations? It is important that you get and maintain your immunizations. These include:  Tetanus, diphtheria, and pertussis (Tdap) booster vaccine.  Influenza every year before the flu season begins.  Pneumonia vaccine.  Shingles vaccine.  Your health care provider may also recommend other immunizations. This information is not intended to replace advice given to you by your health care provider. Make sure you discuss any questions you have with your health care provider. Document Released:  01/13/2006 Document Revised: 06/10/2016 Document Reviewed: 08/25/2015 Elsevier Interactive Patient Education  2018 [employer].

## 2018-11-09 ENCOUNTER — Telehealth: Payer: Self-pay

## 2018-11-09 NOTE — Telephone Encounter (Signed)
Prolia benefits initiated. Form printed for review with prolia reps next week.

## 2018-11-09 NOTE — Telephone Encounter (Signed)
-----   Message from Darreld Mclean, MD sent at 11/08/2018  8:08 PM EST ----- Carmelina Noun, Are you the person who helps [place] set up new prolia pts now? I know that [place] used to do it.  If so, can you help [place] get this lady started on prolia for her osteoporosis?  She is not able to tolerate oral fosamax   Thank you! [place]

## 2018-11-13 DIAGNOSIS — Z8719 Personal history of other diseases of the digestive system: Secondary | ICD-10-CM | POA: Diagnosis not present

## 2018-11-13 DIAGNOSIS — C50212 Malignant neoplasm of upper-inner quadrant of left female breast: Secondary | ICD-10-CM | POA: Diagnosis not present

## 2018-11-13 DIAGNOSIS — Z803 Family history of malignant neoplasm of breast: Secondary | ICD-10-CM | POA: Diagnosis not present

## 2018-11-20 NOTE — Telephone Encounter (Signed)
Prolia benefits verified. No PA required. Pt. May owe approximately $230 OOP. Author phoned pt. To set up appointment for first prolia injection. Pt. stated she could not pay that much OOP, and wanted to talk to Dr. Lorelei Pont more about the medication itself. Author made pt. Aware of possible voucher availability but pt. wanted to talk to Dr. Lorelei Pont more about the medication itself. Routed to Dr. Lorelei Pont to review.

## 2018-11-21 NOTE — Telephone Encounter (Signed)
I called and discussed with patient.  Answered her questions, and she is interested in using Prolia.  I will discuss with the Prolia rep and make sure we can get this covered for her first.  She would like to wait till after the holidays to begin this medication which is certainly fine.  Margaretha Sheffield called me back- she is checking on benefits for this pt.  Likely she will be able to use a grant to pay the OOP cost.  However the grant money is out until January. We will look at this again in the new year

## 2018-12-28 ENCOUNTER — Other Ambulatory Visit: Payer: Self-pay | Admitting: Family Medicine

## 2018-12-28 DIAGNOSIS — M170 Bilateral primary osteoarthritis of knee: Secondary | ICD-10-CM

## 2018-12-28 NOTE — Telephone Encounter (Signed)
Visit is UTD  NCCSR:   11/30/2018  1   11/03/2018  Tramadol Hcl 50 Mg Tablet  30.00 10 Je Cop  [id number]  [facility] ([id number])  1/1 15.00 MME Comm Ins  [place]  11/03/2018  1   11/03/2018  Tramadol Hcl 50 Mg Tablet  30.00 10 Je Cop  [id number]  [facility] ([id number])  0/1 15.00 MME Comm Ins  [place]  10/04/2018  1   04/25/2018  Tramadol Hcl 50 Mg Tablet  30.00 10 Je Cop  [id number]  [facility] ([id number])  1/1 15.00 MME Comm Ins  [place]  09/06/2018  1   04/25/2018  Tramadol Hcl 50 Mg Tablet  30.00 10 Je Cop  [id number]  [facility] ([id number])  0/1 15.00 MME Medicare  [place]  08/09/2018  1   04/25/2018  Tramadol Hcl 50 Mg Tablet  30.00 10 Je Cop  [id number]  [facility] ([id number])  3/5 15.00 MME Medicare  [place]  07/12/2018  1   04/25/2018  Tramadol Hcl 50 Mg Tablet  30.00 10 Je Cop  [id number]  [facility] ([id number])  2/5 15.00 MME Medicare  [place]  06/13/2018  1   04/25/2018  Tramadol Hcl 50 Mg Tablet  30.00 10 Je Cop  [id number]  [facility] (2191)  1/5 15.00 MME Medicare  [place]  04/25/2018  1   04/25/2018  Tramadol Hcl 50 Mg Tablet  30.00 10 Je Cop  [id number]  [facility] (2191)  0/5 15.00 MME Medicare  [place]  04/13/2018  1   04/13/2018  Tramadol Hcl 50 Mg Tablet  30.00 10 Je Cop  [id number]  [facility] (2191)  0/0

## 2019-01-09 ENCOUNTER — Other Ambulatory Visit: Payer: Self-pay | Admitting: Oncology

## 2019-01-09 DIAGNOSIS — M25551 Pain in right hip: Secondary | ICD-10-CM | POA: Diagnosis not present

## 2019-01-09 DIAGNOSIS — M1611 Unilateral primary osteoarthritis, right hip: Secondary | ICD-10-CM | POA: Diagnosis not present

## 2019-03-03 ENCOUNTER — Other Ambulatory Visit: Payer: Self-pay | Admitting: Family Medicine

## 2019-03-03 DIAGNOSIS — Z8711 Personal history of peptic ulcer disease: Secondary | ICD-10-CM

## 2019-03-03 DIAGNOSIS — Z8719 Personal history of other diseases of the digestive system: Secondary | ICD-10-CM

## 2019-03-22 ENCOUNTER — Other Ambulatory Visit: Payer: Self-pay | Admitting: Oncology

## 2019-03-28 ENCOUNTER — Telehealth: Payer: Self-pay | Admitting: Oncology

## 2019-03-28 NOTE — Telephone Encounter (Signed)
Returning patient's phone call regarding rescheduling an appointment from 05/7. Due to concerns of COVID-19 per patient's request appointment has been rescheduled for 07/01.  Message to provider.

## 2019-03-28 NOTE — Telephone Encounter (Signed)
Returned call re rescheduling 5/7 appointment. Not able to reach patient. Left message re receiving message and asking patient to call office re when she will be able to see lab/GM. Appointments for 5/7 cancelled.

## 2019-04-11 ENCOUNTER — Other Ambulatory Visit: Payer: Medicare Other

## 2019-04-11 ENCOUNTER — Ambulatory Visit: Payer: Medicare Other | Admitting: Oncology

## 2019-04-22 ENCOUNTER — Other Ambulatory Visit: Payer: Self-pay | Admitting: Family Medicine

## 2019-04-22 DIAGNOSIS — M170 Bilateral primary osteoarthritis of knee: Secondary | ICD-10-CM

## 2019-04-22 NOTE — Telephone Encounter (Signed)
Copied from [place] [id number]. Topic: Quick Communication - Rx Refill/Question >> Apr 22, 2019  2:12 PM Kathy Thornton, Marland Kitchen wrote: Medication: traMADol (ULTRAM) 50 MG tablet  Has the patient contacted their pharmacy? Yes.   (Agent: If no, request that the patient contact the pharmacy for the refill.) (Agent: If yes, when and what did the pharmacy advise?) to call officce   Preferred Pharmacy (with phone number or street name): [facility]   Agent: Please be advised that RX refills may take up to 3 business days. We ask that you follow-up with your pharmacy.

## 2019-04-26 NOTE — Telephone Encounter (Signed)
Left message to schedule follow up visit with [place].

## 2019-05-22 ENCOUNTER — Other Ambulatory Visit: Payer: Self-pay | Admitting: General Surgery

## 2019-05-22 DIAGNOSIS — Z853 Personal history of malignant neoplasm of breast: Secondary | ICD-10-CM

## 2019-05-22 DIAGNOSIS — Z9889 Other specified postprocedural states: Secondary | ICD-10-CM

## 2019-05-25 ENCOUNTER — Telehealth: Payer: Self-pay | Admitting: Oncology

## 2019-05-25 NOTE — Telephone Encounter (Signed)
Returned call re rescheduling appointment. Left message for patient re calling to let us know when she can come.

## 2019-05-27 ENCOUNTER — Telehealth: Payer: Self-pay | Admitting: Oncology

## 2019-05-27 NOTE — Telephone Encounter (Signed)
I talk with patient regarding reschedule °

## 2019-05-29 ENCOUNTER — Other Ambulatory Visit: Payer: Self-pay | Admitting: Oncology

## 2019-06-05 ENCOUNTER — Ambulatory Visit: Payer: Medicare Other | Admitting: Oncology

## 2019-06-05 ENCOUNTER — Other Ambulatory Visit: Payer: Medicare Other

## 2019-06-13 ENCOUNTER — Other Ambulatory Visit: Payer: Self-pay | Admitting: Oncology

## 2019-07-31 ENCOUNTER — Other Ambulatory Visit: Payer: Self-pay | Admitting: *Deleted

## 2019-08-09 ENCOUNTER — Other Ambulatory Visit: Payer: Self-pay | Admitting: Family Medicine

## 2019-08-09 DIAGNOSIS — M170 Bilateral primary osteoarthritis of knee: Secondary | ICD-10-CM

## 2019-08-13 ENCOUNTER — Other Ambulatory Visit: Payer: Self-pay | Admitting: Family Medicine

## 2019-08-13 DIAGNOSIS — M170 Bilateral primary osteoarthritis of knee: Secondary | ICD-10-CM

## 2019-08-14 ENCOUNTER — Other Ambulatory Visit: Payer: Self-pay

## 2019-08-14 ENCOUNTER — Ambulatory Visit
Admission: RE | Admit: 2019-08-14 | Discharge: 2019-08-14 | Disposition: A | Discharge status: Home / Self Care | Payer: Medicare Other | Source: Ambulatory Visit | Attending: General Surgery | Admitting: General Surgery

## 2019-08-14 DIAGNOSIS — Z9889 Other specified postprocedural states: Secondary | ICD-10-CM

## 2019-08-14 DIAGNOSIS — Z853 Personal history of malignant neoplasm of breast: Secondary | ICD-10-CM

## 2019-08-14 DIAGNOSIS — R928 Other abnormal and inconclusive findings on diagnostic imaging of breast: Secondary | ICD-10-CM | POA: Diagnosis not present

## 2019-08-15 ENCOUNTER — Telehealth: Payer: Self-pay

## 2019-08-15 ENCOUNTER — Encounter: Payer: Self-pay | Admitting: Family Medicine

## 2019-08-15 ENCOUNTER — Ambulatory Visit (INDEPENDENT_AMBULATORY_CARE_PROVIDER_SITE_OTHER): Payer: Medicare Other | Admitting: Family Medicine

## 2019-08-15 DIAGNOSIS — M170 Bilateral primary osteoarthritis of knee: Secondary | ICD-10-CM | POA: Diagnosis not present

## 2019-08-15 NOTE — Telephone Encounter (Signed)
Patient's daughter notified she will need an appointment for further refills. She states she will call back to schedule an appointment when she has time.

## 2019-08-15 NOTE — Telephone Encounter (Signed)
Copied from [place] [id number]. Topic: General - Call Back - No Documentation >> Aug 14, 2019  2:44 PM Erick Blinks wrote: Reason for CRM: [phone] daughter requesting call back to discuss refill, insists that appt isnt necessary because she is scheduled for CPE in december

## 2019-08-15 NOTE — Progress Notes (Signed)
[place] [facility] [address], [place], [place] [address] [phone]  Date:  08/15/2019   Name:  Kathy Thornton   DOB:  Aug 01, 1934   MRN:  [id number]  PCP:  Darreld Mclean, MD    Chief Complaint: No chief complaint on file.   History of Present Illness:  Kathy Thornton is a 85 y.o. very pleasant female patient who presents with the following:  Virtual visit today to go over her medications-she uses tramadol for chronic pain, and we are overdue for an office visit Pt location is home, provider is in office Pt ID confirmed with 2 factors, she gives consent for virtual visit Daughter [name] is involved in her care, also present on the call today History of breast cancer She is seeing oncology in 2 weeks for follow-up visit mammo yesterday  She takes tramadol for her knee pain She takes just one a day generally It tends to work well for her She has not gotten knee steroid shot in a year or so- she is putting this off due to pandemic  She has felt tired, wonders if she might be anemic again-she is seeing oncology soon, offered to do labs for now but she thinks she will be fine for 2 weeks from now  Her stomach is generally ok- she uses protonix daily still  She will get her flu shot later on this season She has an appt to see me in December    Patient Active Problem List   Diagnosis Date Noted  . Genetic testing 03/27/2018  . Family history of colon cancer   . Family history of breast cancer   . Malignant neoplasm of upper-inner quadrant of left breast in female, estrogen receptor positive ([place]) 01/02/2018  . Osteoporosis 01/02/2018  . [name] lesion, acute: Per EGD 11/06/2015 11/06/2015  . Erosive gastritis: per EGD 11/05/2105 11/06/2015  . Hemorrhoids; Grade 1 per colonoscopy 11/06/2015 11/06/2015  . Anemia, iron deficiency   . Acute gastric ulcer   . Anemia 11/05/2015  . Symptomatic anemia 11/05/2015  .  Heart murmur 11/05/2015  . UTI (lower urinary tract infection) 11/05/2015  . Lower extremity edema 11/05/2015  . Arthritis of both knees 11/04/2015    Past Medical History:  Diagnosis Date  . Breast cancer ([place]) 2019   Left Breast Cancer  . Breast cancer in female [facility])    Left  . [name] lesion, acute: Per EGD 11/06/2015 11/06/2015  . Cataract   . Erosive gastritis: per EGD 11/05/2105 11/06/2015  . Family history of breast cancer   . Family history of colon cancer   . Heart murmur   . Hemorrhoids; Grade 1 per colonoscopy 11/06/2015 11/06/2015  . Knee pain     Past Surgical History:  Procedure Laterality Date  . BREAST LUMPECTOMY Left 03/30/2018  . BREAST LUMPECTOMY WITH RADIOACTIVE SEED LOCALIZATION Left 03/30/2018   Procedure: RADIOACTIVE SEED GUIDED LEFT BREAST LUMPECTOMY;  Surgeon: Fanny Skates, MD;  Location: [facility];  Service: General;  Laterality: Left;  . COLONOSCOPY WITH PROPOFOL N/A 11/06/2015   Procedure: COLONOSCOPY WITH PROPOFOL;  Surgeon: Ladene Artist, MD;  Location: [facility];  Service: Endoscopy;  Laterality: N/A;  . ESOPHAGOGASTRODUODENOSCOPY (EGD) WITH PROPOFOL N/A 11/06/2015   Procedure: ESOPHAGOGASTRODUODENOSCOPY (EGD) WITH PROPOFOL;  Surgeon: Ladene Artist, MD;  Location: [facility];  Service: Endoscopy;  Laterality: N/A;  . EYE SURGERY      Social History   Tobacco  Use  . Smoking status: Never Smoker  . Smokeless tobacco: Never Used  Substance Use Topics  . Alcohol use: No  . Drug use: No    Family History  Problem Relation Age of Onset  . Aneurysm Mother        brain  . Heart disease Father   . Heart disease Brother   . AAA (abdominal aortic aneurysm) Maternal Uncle   . Colon cancer Paternal Uncle        dx >50  . Breast cancer Cousin        pat cousin, dx in her late 54s    Allergies  Allergen Reactions  . Sulfa Antibiotics Hives    Medication list has been reviewed and updated.  Current Outpatient Medications on File Prior to  Visit  Medication Sig Dispense Refill  . anastrozole (ARIMIDEX) 1 MG tablet TAKE 1 TABLET(1 MG) BY MOUTH DAILY 90 tablet 0  . Cinnamon 500 MG capsule Take 1 capsule (500 mg total) by mouth daily.    . pantoprazole (PROTONIX) 40 MG tablet TAKE 1 TABLET(40 MG) BY MOUTH DAILY 90 tablet 1  . traMADol (ULTRAM) 50 MG tablet TAKE 1 TABLET BY MOUTH EVERY 8 HOURS AS NEEDED FOR MODERATE PAIN 30 tablet 0  . Turmeric 500 MG CAPS Take 1 capsule by mouth 1 day or 1 dose.    . Vitamin D, Ergocalciferol, 50 MCG (2000 UT) CAPS Take by mouth.     No current facility-administered medications on file prior to visit.     Review of Systems:  As per HPI- otherwise negative. No chest pain or shortness of breath, no fever or chills  Physical Examination: There were no vitals filed for this visit. There were no vitals filed for this visit. There is no height or weight on file to calculate BMI. Ideal Body Weight:    Temp 97.8 136/66 P 63 Weight 140  Pt observed over video  She looks well, normal self.  No cough, wheezing, distress is noted  Wt Readings from Last 3 Encounters:  11/08/18 144 lb (65.3 kg)  11/08/18 144 lb (65.3 kg)  06/11/18 142 lb 14.4 oz (64.8 kg)     Assessment and Plan: Primary osteoarthritis of both knees  Arthritis of both knees  Virtual visit today for routine follow-up.  Kathy Thornton is using tramadol as needed for chronic knee pain.  She has severe arthritis, and is not interested in surgery at this time.  I feel she is okay to continue using tramadol as she has done so far.  She plans to get flu shot later on this season Will see me in December for a face-to-face visit  Signed Lamar Blinks, MD

## 2019-08-29 ENCOUNTER — Inpatient Hospital Stay (HOSPITAL_BASED_OUTPATIENT_CLINIC_OR_DEPARTMENT_OTHER): Payer: Medicare Other | Admitting: Oncology

## 2019-08-29 ENCOUNTER — Other Ambulatory Visit: Payer: Self-pay

## 2019-08-29 ENCOUNTER — Inpatient Hospital Stay: Payer: Medicare Other | Attending: Oncology

## 2019-08-29 VITALS — BP 144/81 | HR 92 | Temp 99.4°F | Resp 18 | Wt 148.2 lb

## 2019-08-29 DIAGNOSIS — N390 Urinary tract infection, site not specified: Secondary | ICD-10-CM

## 2019-08-29 DIAGNOSIS — Z800 Family history of malignant neoplasm of digestive organs: Secondary | ICD-10-CM | POA: Diagnosis not present

## 2019-08-29 DIAGNOSIS — Z8249 Family history of ischemic heart disease and other diseases of the circulatory system: Secondary | ICD-10-CM | POA: Insufficient documentation

## 2019-08-29 DIAGNOSIS — Z803 Family history of malignant neoplasm of breast: Secondary | ICD-10-CM | POA: Insufficient documentation

## 2019-08-29 DIAGNOSIS — C50212 Malignant neoplasm of upper-inner quadrant of left female breast: Secondary | ICD-10-CM | POA: Diagnosis not present

## 2019-08-29 DIAGNOSIS — M818 Other osteoporosis without current pathological fracture: Secondary | ICD-10-CM

## 2019-08-29 DIAGNOSIS — Z170 Estrogen receptor positive status [ER+]: Secondary | ICD-10-CM | POA: Insufficient documentation

## 2019-08-29 DIAGNOSIS — M1990 Unspecified osteoarthritis, unspecified site: Secondary | ICD-10-CM | POA: Insufficient documentation

## 2019-08-29 DIAGNOSIS — Z79811 Long term (current) use of aromatase inhibitors: Secondary | ICD-10-CM | POA: Insufficient documentation

## 2019-08-29 DIAGNOSIS — Z23 Encounter for immunization: Secondary | ICD-10-CM | POA: Diagnosis not present

## 2019-08-29 DIAGNOSIS — Z882 Allergy status to sulfonamides status: Secondary | ICD-10-CM | POA: Insufficient documentation

## 2019-08-29 LAB — CBC WITH DIFFERENTIAL/PLATELET
Abs Immature Granulocytes: 0.01 10*3/uL (ref 0.00–0.07)
Basophils Absolute: 0 10*3/uL (ref 0.0–0.1)
Basophils Relative: 1 %
Eosinophils Absolute: 0.1 10*3/uL (ref 0.0–0.5)
Eosinophils Relative: 2 %
HCT: 41.1 % (ref 36.0–46.0)
Hemoglobin: 13.5 g/dL (ref 12.0–15.0)
Immature Granulocytes: 0 %
Lymphocytes Relative: 37 %
Lymphs Abs: 2.1 10*3/uL (ref 0.7–4.0)
MCH: 30.8 pg (ref 26.0–34.0)
MCHC: 32.8 g/dL (ref 30.0–36.0)
MCV: 93.8 fL (ref 80.0–100.0)
Monocytes Absolute: 0.4 10*3/uL (ref 0.1–1.0)
Monocytes Relative: 8 %
Neutro Abs: 3 10*3/uL (ref 1.7–7.7)
Neutrophils Relative %: 52 %
Platelets: 180 10*3/uL (ref 150–400)
RBC: 4.38 MIL/uL (ref 3.87–5.11)
RDW: 13.9 % (ref 11.5–15.5)
WBC: 5.8 10*3/uL (ref 4.0–10.5)
nRBC: 0 % (ref 0.0–0.2)

## 2019-08-29 LAB — COMPREHENSIVE METABOLIC PANEL
ALT: 8 U/L (ref 0–44)
AST: 15 U/L (ref 15–41)
Albumin: 4.4 g/dL (ref 3.5–5.0)
Alkaline Phosphatase: 104 U/L (ref 38–126)
Anion gap: 8 (ref 5–15)
BUN: 13 mg/dL (ref 8–23)
CO2: 25 mmol/L (ref 22–32)
Calcium: 9.9 mg/dL (ref 8.9–10.3)
Chloride: 107 mmol/L (ref 98–111)
Creatinine, Ser: 1.12 mg/dL — ABNORMAL HIGH (ref 0.44–1.00)
GFR calc Af Amer: 53 mL/min — ABNORMAL LOW (ref >60)
GFR calc non Af Amer: 45 mL/min — ABNORMAL LOW (ref >60)
Glucose, Bld: 102 mg/dL — ABNORMAL HIGH (ref 70–99)
Potassium: 4.3 mmol/L (ref 3.5–5.1)
Sodium: 140 mmol/L (ref 135–145)
Total Bilirubin: 0.4 mg/dL (ref 0.3–1.2)
Total Protein: 7.3 g/dL (ref 6.5–8.1)

## 2019-08-29 MED ORDER — INFLUENZA VAC A&B SA ADJ QUAD 0.5 ML IM PRSY
0.5000 mL | PREFILLED_SYRINGE | Freq: Once | INTRAMUSCULAR | Status: AC
  Administered 2019-08-29: 0.5 mL via INTRAMUSCULAR

## 2019-08-29 MED ORDER — ANASTROZOLE 1 MG PO TABS: 1.0000 mg | ORAL_TABLET | Freq: Every day | ORAL | 4 refills | Status: DC

## 2019-08-29 NOTE — Progress Notes (Signed)
Kathy Thornton  [phone] [phone]     ID: Kathy Thornton DOB: [birth year]  MR#: [id number]  UXY#:[id number]  Patient Care Team: Darreld Mclean, MD as PCP - General (Family Medicine) Earlie Server, MD as Consulting Physician (Orthopedic Surgery) Gatha Mayer, MD as Consulting Physician (Gastroenterology) , Virgie Dad, MD as Consulting Physician (Oncology) Fanny Skates, MD as Consulting Physician (General Surgery) OTHER MD:  CHIEF COMPLAINT: Estrogen receptor positive breast cancer  CURRENT TREATMENT: anastrozole   INTERVAL HISTORY: Kathy Thornton returns today for follow-up of her estrogen receptor positive breast cancer, accompanied by her daughter.   She continues on anastrozole, which she takes at night.  She has mild hot flashes usually in the morning.  She does not feel she needs to take anything to deal with this..   Since her last visit, she underwent bilateral diagnostic mammography with tomography at [facility] on 08/14/2019 showing: breast density category B; no evidence of malignancy in either breast.  Her most recent bone density screening is from 10/2017 and shows a T-score of -2.5. Prolia was discussed with the patient, per telephone note with her PCP on 11/21/2018, but this does not appear to have been followed.   REVIEW OF SYSTEMS: Kathy Thornton tells me she is doing "great".  She has a good appetite and is gaining weight.  She has good energy and while she cannot walk long distances because of her severe arthritis problems she does quite a bit around the house.  She has had no fevers rash bleeding cough phlegm production pleurisy shortness of breath or change in bowel or bladder habits.  Detailed review of systems today was stable   HISTORY OF CURRENT ILLNESS:  From the original intake note:  Kathy Thornton had routine screening mammography at Dr. Arlyn Dunning on 10/30/2017 showing a possible abnormality in the left  breast. She underwent bilateral diagnostic mammography with tomography and left breast ultrasonography at the [facility] on 12/19/2017 showing:  a highly suspicious mass in the left breast at 11 o'clock. No evidence of left axillary lymphadenopathy. Ultrasound targeted to the left breast at 11 o'clock, 5 cm from the nipple demonstrates an ill-defined irregular vascular mass with spiculated margins measuring 8 x 6 x 6 mm. Ultrasound of the left axilla demonstrates multiple normal-appearing lymph nodes.  Accordingly on 12/22/2017 she proceeded to biopsy of the left breast area in question. 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 Prognostic indicators significant for: ER, 90% positive and PR, 80% positive, both with strong staining intensity. Proliferation marker Ki67 at 3%. HER2 negative via [place].  The patient's subsequent history is as detailed below.   PAST MEDICAL HISTORY: Past Medical History:  Diagnosis Date  . Breast cancer ([place]) 2019   Left Breast Cancer  . Breast cancer in female [facility])    Left  . [name] lesion, acute: Per EGD 11/06/2015 11/06/2015  . Cataract   . Erosive gastritis: per EGD 11/05/2105 11/06/2015  . Family history of breast cancer   . Family history of colon cancer   . Heart murmur   . Hemorrhoids; Grade 1 per colonoscopy 11/06/2015 11/06/2015  . Knee pain     PAST SURGICAL HISTORY: Past Surgical History:  Procedure Laterality Date  . BREAST LUMPECTOMY Left 03/30/2018  . BREAST LUMPECTOMY WITH RADIOACTIVE SEED LOCALIZATION Left 03/30/2018   Procedure: RADIOACTIVE SEED GUIDED LEFT BREAST LUMPECTOMY;  Surgeon: Fanny Skates, MD;  Location: [place];  Service: General;  Laterality: Left;  . COLONOSCOPY WITH PROPOFOL N/A 11/06/2015   Procedure: COLONOSCOPY WITH PROPOFOL;  Surgeon: Ladene Artist, MD;   Location: [facility];  Service: Endoscopy;  Laterality: N/A;  . ESOPHAGOGASTRODUODENOSCOPY (EGD) WITH PROPOFOL N/A 11/06/2015   Procedure: ESOPHAGOGASTRODUODENOSCOPY (EGD) WITH PROPOFOL;  Surgeon: Ladene Artist, MD;  Location: [facility];  Service: Endoscopy;  Laterality: N/A;  . EYE SURGERY      FAMILY HISTORY Family History  Problem Relation Age of Onset  . Aneurysm Mother        brain  . Heart disease Father   . Heart disease Brother   . AAA (abdominal aortic aneurysm) Maternal Uncle   . Colon cancer Paternal Uncle        dx >50  . Breast cancer Cousin        pat cousin, dx in her late 59s  Her mother had a brain aneurysm at age 50 that she passed from. Her father lived with cardiac issues and passed at age 74. She has 2 brothers and 3 sisters. She had one paternal cousin that had breast cancer at age 17. One uncle had a prior hx of colon cancer. She denies any family hx of ovarian cancer.    GYNECOLOGIC HISTORY:  No LMP recorded. Patient is postmenopausal. Menarche: 85 years old Age at first live birth: 85 years old [place] P: [id number] LMP: 85 years old Contraceptive:No HRT: No    SOCIAL HISTORY: She retired from [employer], [employer], [employer], and [employer] at age 85. She will have been married 78 years in August, 2019. Her husband's name is Elenore Rota and he is a Horticulturist, commercial. She lives at home with her husband. She has no pets, but reports that she keeps her granddaughter when the girl is not in school. She has 36 grandchildren and great grandchildren combined. She is a Psychologist, forensic.      ADVANCED DIRECTIVES: She is comfortable with her husband being her healthcare power of attorney   HEALTH MAINTENANCE: Social History   Tobacco Use  . Smoking status: Never Smoker  . Smokeless tobacco: Never Used  Substance Use Topics  . Alcohol use: No  . Drug use: No     Colonoscopy: 11/05/2015/[facility]  PAP: Last one >10 years ago per patient   Bone density: 10/30/2017 showed a T  score of -2.5   Allergies  Allergen Reactions  . Sulfa Antibiotics Hives    Current Outpatient Medications  Medication Sig Dispense Refill  . anastrozole (ARIMIDEX) 1 MG tablet Take 1 tablet (1 mg total) by mouth daily. 90 tablet 4  . Cinnamon 500 MG capsule Take 1 capsule (500 mg total) by mouth daily.    . pantoprazole (PROTONIX) 40 MG tablet TAKE 1 TABLET(40 MG) BY MOUTH DAILY 90 tablet 1  . traMADol (ULTRAM) 50 MG tablet TAKE 1 TABLET BY MOUTH EVERY 8 HOURS AS NEEDED FOR MODERATE PAIN 30 tablet 0  . Turmeric 500 MG CAPS Take 1 capsule by mouth 1 day or 1 dose.    . Vitamin D, Ergocalciferol, 50 MCG (2000 UT) CAPS Take by mouth.     Current Facility-Administered Medications  Medication Dose Route Frequency Provider Last Rate Last Dose  . influenza vaccine adjuvanted (FLUAD) injection 0.5 mL  0.5 mL Intramuscular Once , Virgie Dad, MD        OBJECTIVE: Elderly white woman who walks with 2 canes  Vitals:  08/29/19 1440  BP: (!) 144/81  Pulse: 92  Resp: 18  Temp: 99.4 F (37.4 C)  SpO2: 99%     Body mass index is 29.93 kg/m.   Wt Readings from Last 3 Encounters:  08/29/19 148 lb 3.2 oz (67.2 kg)  11/08/18 144 lb (65.3 kg)  11/08/18 144 lb (65.3 kg)      ECOG FS:1 - Symptomatic but completely ambulatory  Sclerae unicteric, EOMs intact Wearing a mask No cervical or supraclavicular adenopathy Lungs no rales or rhonchi Heart regular rate and rhythm Abd soft, nontender, positive bowel sounds MSK no focal spinal tenderness, no upper extremity lymphedema Neuro: nonfocal, well oriented, appropriate affect Breasts: The right breast is benign.  The left breast has undergone lumpectomy with no evidence of disease recurrence.  Both axillae are benign.   LAB RESULTS:  CMP     Component Value Date/Time   NA 140 08/29/2019 1351   K 4.3 08/29/2019 1351   CL 107 08/29/2019 1351   CO2 25 08/29/2019 1351   GLUCOSE 102 (H) 08/29/2019 1351   BUN 13 08/29/2019 1351    CREATININE 1.12 (H) 08/29/2019 1351   CREATININE 1.03 03/14/2018 1441   CREATININE 1.11 (H) 01/19/2016 1126   CALCIUM 9.9 08/29/2019 1351   PROT 7.3 08/29/2019 1351   ALBUMIN 4.4 08/29/2019 1351   AST 15 08/29/2019 1351   AST 13 03/14/2018 1441   ALT 8 08/29/2019 1351   ALT 10 03/14/2018 1441   ALKPHOS 104 08/29/2019 1351   BILITOT 0.4 08/29/2019 1351   BILITOT 0.4 03/14/2018 1441   GFRNONAA 45 (L) 08/29/2019 1351   GFRNONAA 49 (L) 03/14/2018 1441   GFRAA 53 (L) 08/29/2019 1351   GFRAA 57 (L) 03/14/2018 1441    No results found for: TOTALPROTELP, ALBUMINELP, A1GS, A2GS, BETS, BETA2SER, GAMS, MSPIKE, SPEI  No results found for: KPAFRELGTCHN, LAMBDASER, KAPLAMBRATIO  Lab Results  Component Value Date   WBC 5.8 08/29/2019   NEUTROABS 3.0 08/29/2019   HGB 13.5 08/29/2019   HCT 41.1 08/29/2019   MCV 93.8 08/29/2019   PLT 180 08/29/2019    @LASTCHEMISTRY @  No results found for: LABCA2  No components found for: RKYHCW237  No results for input(s): INR in the last 168 hours.  No results found for: LABCA2  No results found for: SEG315  No results found for: VVO160  No results found for: VPX106  No results found for: CA2729  No components found for: HGQUANT  No results found for: CEA1 / No results found for: CEA1   No results found for: AFPTUMOR  No results found for: CHROMOGRNA  No results found for: PSA1  Appointment on 08/29/2019  Component Date Value Ref Range Status  . WBC 08/29/2019 5.8  4.0 - 10.5 K/uL Final  . RBC 08/29/2019 4.38  3.87 - 5.11 MIL/uL Final  . Hemoglobin 08/29/2019 13.5  12.0 - 15.0 g/dL Final  . HCT 08/29/2019 41.1  36.0 - 46.0 % Final  . MCV 08/29/2019 93.8  80.0 - 100.0 fL Final  . MCH 08/29/2019 30.8  26.0 - 34.0 pg Final  . MCHC 08/29/2019 32.8  30.0 - 36.0 g/dL Final  . RDW 08/29/2019 13.9  11.5 - 15.5 % Final  . Platelets 08/29/2019 180  150 - 400 K/uL Final  . nRBC 08/29/2019 0.0  0.0 - 0.2 % Final  . Neutrophils Relative %  08/29/2019 52  % Final  . Neutro Abs 08/29/2019 3.0  1.7 - 7.7 K/uL Final  . Lymphocytes  Relative 08/29/2019 37  % Final  . Lymphs Abs 08/29/2019 2.1  0.7 - 4.0 K/uL Final  . Monocytes Relative 08/29/2019 8  % Final  . Monocytes Absolute 08/29/2019 0.4  0.1 - 1.0 K/uL Final  . Eosinophils Relative 08/29/2019 2  % Final  . Eosinophils Absolute 08/29/2019 0.1  0.0 - 0.5 K/uL Final  . Basophils Relative 08/29/2019 1  % Final  . Basophils Absolute 08/29/2019 0.0  0.0 - 0.1 K/uL Final  . Immature Granulocytes 08/29/2019 0  % Final  . Abs Immature Granulocytes 08/29/2019 0.01  0.00 - 0.07 K/uL Final   Performed at [facility], [place] [address], [place], [place] [address]  . Sodium 08/29/2019 140  135 - 145 mmol/L Final  . Potassium 08/29/2019 4.3  3.5 - 5.1 mmol/L Final  . Chloride 08/29/2019 107  98 - 111 mmol/L Final  . CO2 08/29/2019 25  22 - 32 mmol/L Final  . Glucose, Bld 08/29/2019 102* 70 - 99 mg/dL Final  . BUN 08/29/2019 13  8 - 23 mg/dL Final  . Creatinine, Ser 08/29/2019 1.12* 0.44 - 1.00 mg/dL Final  . Calcium 08/29/2019 9.9  8.9 - 10.3 mg/dL Final  . Total Protein 08/29/2019 7.3  6.5 - 8.1 g/dL Final  . Albumin 08/29/2019 4.4  3.5 - 5.0 g/dL Final  . AST 08/29/2019 15  15 - 41 U/L Final  . ALT 08/29/2019 8  0 - 44 U/L Final  . Alkaline Phosphatase 08/29/2019 104  38 - 126 U/L Final  . Total Bilirubin 08/29/2019 0.4  0.3 - 1.2 mg/dL Final  . GFR calc non Af Amer 08/29/2019 45* >60 mL/min Final  . GFR calc Af Amer 08/29/2019 53* >60 mL/min Final  . Anion gap 08/29/2019 8  5 - 15 Final   Performed at [facility], [place] [address] [place], [place] [address]    (this displays the last labs from the last 3 days)  No results found for: TOTALPROTELP, ALBUMINELP, A1GS, A2GS, BETS, BETA2SER, GAMS, MSPIKE, SPEI (this displays SPEP labs)  No results found for: KPAFRELGTCHN, LAMBDASER, KAPLAMBRATIO (kappa/lambda light chains)  No  results found for: HGBA, HGBA2QUANT, HGBFQUANT, HGBSQUAN (Hemoglobinopathy evaluation)   No results found for: LDH  Lab Results  Component Value Date   IRON 14 (L) 11/05/2015   TIBC 545 (H) 11/05/2015   IRONPCTSAT 3 (L) 11/05/2015   (Iron and TIBC)  Lab Results  Component Value Date   FERRITIN 3 (L) 11/05/2015    Urinalysis    Component Value Date/Time   COLORURINE YELLOW 02/07/2016 1540   APPEARANCEUR CLOUDY (A) 02/07/2016 1540   LABSPEC 1.016 02/07/2016 1540   PHURINE 6.0 02/07/2016 1540   GLUCOSEU NEGATIVE 02/07/2016 1540   HGBUR NEGATIVE 02/07/2016 1540   BILIRUBINUR NEGATIVE 02/07/2016 1540   BILIRUBINUR negative 11/04/2015 1418   KETONESUR 15 (A) 02/07/2016 1540   PROTEINUR NEGATIVE 02/07/2016 1540   UROBILINOGEN 0.2 11/04/2015 1418   NITRITE NEGATIVE 02/07/2016 [place] 02/07/2016 1540     STUDIES: Mm Diag Breast Tomo Bilateral  Result Date: 08/14/2019 CLINICAL DATA:  Status post left lumpectomy for breast carcinoma, performed April 2019. EXAM: DIGITAL DIAGNOSTIC BILATERAL MAMMOGRAM WITH CAD AND TOMO COMPARISON:  Previous exam(s). ACR Breast Density Category b: There are scattered areas of fibroglandular density. FINDINGS: Postsurgical changes are noted in the upper slightly medial aspect the left breast. There are no masses or areas of nonsurgical architectural distortion. There are no new  or suspicious calcifications. Mammographic images were processed with CAD. IMPRESSION: 1. No evidence of new or recurrent breast carcinoma. 2. Benign postsurgical changes on the left. RECOMMENDATION: 1. Diagnostic mammography in 1 year per standard post lumpectomy protocol. I have discussed the findings and recommendations with the patient. If applicable, a reminder letter will be sent to the patient regarding the next appointment. BI-RADS CATEGORY  2: Benign. Electronically Signed   By: Lajean Manes M.D.   On: 08/14/2019 14:14     ELIGIBLE FOR AVAILABLE  RESEARCH PROTOCOL:no  ASSESSMENT: 85 y.o. [place] woman status post left breast upper inner quadrant biopsy 12/22/2017 for a clinical T1b N0, stage IA invasive ductal carcinoma, grade 1, estrogen and progesterone receptor positive, HER-2 not amplified, with an MIB-1 of 3%.  (1) genetics testing 03/23/2018 through the hereditary Gene Panel offered by [facility] found no deleterious mutations in APC, ATM, AXIN2, BARD1, BMPR1A, BRCA1, BRCA2, BRIP1, CDH1, CDK4, CDKN2A (p14ARF), CDKN2A (p16INK4a), CHEK2, CTNNA1, DICER1, EPCAM (Deletion/duplication testing only), GREM1 (promoter region deletion/duplication testing only), KIT, MEN1, MLH1, MSH2, MSH3, MSH6, MUTYH, NBN, NF1, NHTL1, PALB2, PDGFRA, PMS2, POLD1, POLE, PTEN, RAD50, RAD51C, RAD51D, SDHB, SDHC, SDHD, SMAD4, SMARCA4. STK11, TP53, TSC1, TSC2, and VHL.  The following genes were evaluated for sequence changes only: SDHA and HOXB13 c.251G>A variant only.   (a) APC c.7468G>A (p.Asp2490Asn) VUS identified   (2) patient initially refused surgery or radiation, but underwent left lumpectomy 03/30/2018 for a pT1b cN0, stage IA invasive ductal carcinoma, grade 1, with negative margins  (3) anastrozole started 01/02/2018  (a) bone density 10/30/2017 shows a T score of -2.5  (b) alendronate started January 2019, discontinued?  PLAN: Kathy Thornton is tolerating anastrozole generally well and the plan will be to continue that a total of 5 years.  Her kidney function has decreased somewhat as compared to about a year and a half ago.  I gave her all the numbers.  She is not on any nonsteroidals or aspirin.  I did ask her to make sure to drink at least a quart of liquid daily preferably 2.  She will receive her flu shot today  She is taking appropriate pandemic precautions  She will return to see [place] again in 1 year.  She knows to call for any other issues that may develop before then.   , Virgie Dad, MD  08/29/19 3:22 PM Medical Oncology and Hematology [facility] [address] [place], [place] [address] Tel. [phone]    Fax. [phone]    I, Wilburn Mylar, am acting as scribe for Dr. Virgie Dad. .  I, Lurline Del MD, have reviewed the above documentation for accuracy and completeness, and I agree with the above.

## 2019-08-30 ENCOUNTER — Telehealth: Payer: Self-pay | Admitting: Oncology

## 2019-08-30 NOTE — Telephone Encounter (Signed)
I talk with patient regarding schedule  

## 2019-09-04 DIAGNOSIS — Z803 Family history of malignant neoplasm of breast: Secondary | ICD-10-CM | POA: Diagnosis not present

## 2019-09-04 DIAGNOSIS — C50212 Malignant neoplasm of upper-inner quadrant of left female breast: Secondary | ICD-10-CM | POA: Diagnosis not present

## 2019-09-04 DIAGNOSIS — Z8719 Personal history of other diseases of the digestive system: Secondary | ICD-10-CM | POA: Diagnosis not present

## 2019-09-13 ENCOUNTER — Other Ambulatory Visit: Payer: Self-pay | Admitting: Family Medicine

## 2019-09-13 DIAGNOSIS — Z8719 Personal history of other diseases of the digestive system: Secondary | ICD-10-CM

## 2019-09-13 DIAGNOSIS — M170 Bilateral primary osteoarthritis of knee: Secondary | ICD-10-CM

## 2019-09-13 DIAGNOSIS — Z8711 Personal history of peptic ulcer disease: Secondary | ICD-10-CM

## 2019-09-13 MED ORDER — PANTOPRAZOLE SODIUM 40 MG PO TBEC: DELAYED_RELEASE_TABLET | ORAL | 1 refills | Status: DC

## 2019-09-13 NOTE — Telephone Encounter (Signed)
Requested medication (s) are due for refill today: yes  Requested medication (s) are on the active medication list: yes  Last refill:  08/13/2019  Future visit scheduled: yes  Notes to clinic:  Refill cannot be delegated    Requested Prescriptions  Pending Prescriptions Disp Refills   traMADol (ULTRAM) 50 MG tablet 30 tablet 0     Not Delegated - Analgesics:  Opioid Agonists Failed - 09/13/2019  1:33 PM      Failed - This refill cannot be delegated      Failed - Urine Drug Screen completed in last 360 days.      Passed - Valid encounter within last 6 months    Recent Outpatient Visits          4 weeks ago Primary osteoarthritis of both [place] at [place], MD   10 months ago Physical exam   Archivist at [place], Gay Filler, MD   1 year ago Malignant neoplasm of upper-inner quadrant of left breast in female, estrogen receptor positive ([place])   Archivist at [facility], Gay Filler, MD   1 year ago Screening for diabetes mellitus   Archivist at [place], Gay Filler, MD   2 years ago Primary osteoarthritis of both [place] at [place], Gay Filler, MD      Future Appointments            In 1 month [place], Gay Filler, MD [place] at [facility], [place]           Signed Prescriptions Disp Refills   pantoprazole (PROTONIX) 40 MG tablet 90 tablet 1    Sig: TAKE 1 TABLET(40 MG) BY MOUTH DAILY     Gastroenterology: Proton Pump Inhibitors Passed - 09/13/2019  1:33 PM      Passed - Valid encounter within last 12 months    Recent Outpatient Visits          4 weeks ago Primary osteoarthritis of both knees   Archivist at [place], MD   10 months ago Physical exam   Careers adviser at [place], Gay Filler, MD   1 year ago Malignant neoplasm of upper-inner quadrant of left breast in female, estrogen receptor positive ([place])   Archivist at [facility], Gay Filler, MD   1 year ago Screening for diabetes mellitus   Archivist at [place], Gay Filler, MD   2 years ago Primary osteoarthritis of both [place] at [place], Gay Filler, MD      Future Appointments            In 1 month [place], Gay Filler, MD [place] at [place]

## 2019-09-13 NOTE — Telephone Encounter (Signed)
Medication refill: traMADol (ULTRAM) 50 MG tablet [id number]  pantoprazole (PROTONIX) 40 MG tablet [id number]   Pharmacy:  [facility] [place], [place] AT [facility] [place] [phone] (Phone) [phone] (Fax)   Pt Is out of medication and aware of turn around time. Please advise

## 2019-09-14 MED ORDER — TRAMADOL HCL 50 MG PO TABS: ORAL_TABLET | ORAL | 3 refills | Status: DC

## 2019-10-13 ENCOUNTER — Encounter (HOSPITAL_COMMUNITY): Payer: Self-pay | Admitting: *Deleted

## 2019-10-13 ENCOUNTER — Emergency Department (HOSPITAL_COMMUNITY)
Admission: EM | Admit: 2019-10-13 | Discharge: 2019-10-13 | Disposition: A | Discharge status: Home / Self Care | Payer: Medicare Other | Attending: Emergency Medicine | Admitting: Emergency Medicine

## 2019-10-13 ENCOUNTER — Other Ambulatory Visit: Payer: Self-pay

## 2019-10-13 ENCOUNTER — Emergency Department (HOSPITAL_COMMUNITY): Payer: Medicare Other

## 2019-10-13 DIAGNOSIS — R109 Unspecified abdominal pain: Secondary | ICD-10-CM | POA: Diagnosis not present

## 2019-10-13 DIAGNOSIS — Z853 Personal history of malignant neoplasm of breast: Secondary | ICD-10-CM | POA: Diagnosis not present

## 2019-10-13 DIAGNOSIS — Z79899 Other long term (current) drug therapy: Secondary | ICD-10-CM | POA: Insufficient documentation

## 2019-10-13 DIAGNOSIS — K8020 Calculus of gallbladder without cholecystitis without obstruction: Secondary | ICD-10-CM | POA: Diagnosis not present

## 2019-10-13 DIAGNOSIS — R319 Hematuria, unspecified: Secondary | ICD-10-CM | POA: Insufficient documentation

## 2019-10-13 DIAGNOSIS — R1031 Right lower quadrant pain: Secondary | ICD-10-CM

## 2019-10-13 LAB — URINALYSIS, ROUTINE W REFLEX MICROSCOPIC
Bacteria, UA: NONE SEEN
Bilirubin Urine: NEGATIVE
Glucose, UA: NEGATIVE mg/dL
Ketones, ur: 20 mg/dL — AB
Nitrite: NEGATIVE
Protein, ur: NEGATIVE mg/dL
Specific Gravity, Urine: 1.015 (ref 1.005–1.030)
pH: 5 (ref 5.0–8.0)

## 2019-10-13 LAB — CBC WITH DIFFERENTIAL/PLATELET
Abs Immature Granulocytes: 0.02 10*3/uL (ref 0.00–0.07)
Basophils Absolute: 0.1 10*3/uL (ref 0.0–0.1)
Basophils Relative: 1 %
Eosinophils Absolute: 0 10*3/uL (ref 0.0–0.5)
Eosinophils Relative: 1 %
HCT: 40.9 % (ref 36.0–46.0)
Hemoglobin: 13.6 g/dL (ref 12.0–15.0)
Immature Granulocytes: 0 %
Lymphocytes Relative: 20 %
Lymphs Abs: 1.4 10*3/uL (ref 0.7–4.0)
MCH: 31.3 pg (ref 26.0–34.0)
MCHC: 33.3 g/dL (ref 30.0–36.0)
MCV: 94.2 fL (ref 80.0–100.0)
Monocytes Absolute: 0.5 10*3/uL (ref 0.1–1.0)
Monocytes Relative: 7 %
Neutro Abs: 5 10*3/uL (ref 1.7–7.7)
Neutrophils Relative %: 71 %
Platelets: 179 10*3/uL (ref 150–400)
RBC: 4.34 MIL/uL (ref 3.87–5.11)
RDW: 14 % (ref 11.5–15.5)
WBC: 7 10*3/uL (ref 4.0–10.5)
nRBC: 0 % (ref 0.0–0.2)

## 2019-10-13 LAB — LIPASE, BLOOD: Lipase: 28 U/L (ref 11–51)

## 2019-10-13 LAB — COMPREHENSIVE METABOLIC PANEL
ALT: 11 U/L (ref 0–44)
AST: 14 U/L — ABNORMAL LOW (ref 15–41)
Albumin: 4.6 g/dL (ref 3.5–5.0)
Alkaline Phosphatase: 83 U/L (ref 38–126)
Anion gap: 14 (ref 5–15)
BUN: 22 mg/dL (ref 8–23)
CO2: 24 mmol/L (ref 22–32)
Calcium: 9.3 mg/dL (ref 8.9–10.3)
Chloride: 102 mmol/L (ref 98–111)
Creatinine, Ser: 1.27 mg/dL — ABNORMAL HIGH (ref 0.44–1.00)
GFR calc Af Amer: 45 mL/min — ABNORMAL LOW (ref >60)
GFR calc non Af Amer: 39 mL/min — ABNORMAL LOW (ref >60)
Glucose, Bld: 101 mg/dL — ABNORMAL HIGH (ref 70–99)
Potassium: 3.7 mmol/L (ref 3.5–5.1)
Sodium: 140 mmol/L (ref 135–145)
Total Bilirubin: 1 mg/dL (ref 0.3–1.2)
Total Protein: 7.6 g/dL (ref 6.5–8.1)

## 2019-10-13 MED ORDER — MORPHINE SULFATE (PF) 4 MG/ML IV SOLN
4.0000 mg | Freq: Once | INTRAVENOUS | Status: AC
  Administered 2019-10-13: 4 mg via INTRAVENOUS
  Filled 2019-10-13: qty 1

## 2019-10-13 MED ORDER — ONDANSETRON HCL 4 MG/2ML IJ SOLN
4.0000 mg | Freq: Once | INTRAMUSCULAR | Status: AC
  Administered 2019-10-13: 4 mg via INTRAVENOUS
  Filled 2019-10-13: qty 2

## 2019-10-13 MED ORDER — SODIUM CHLORIDE (PF) 0.9 % IJ SOLN
INTRAMUSCULAR | Status: AC
  Filled 2019-10-13: qty 50

## 2019-10-13 MED ORDER — IOHEXOL 300 MG/ML  SOLN
80.0000 mL | Freq: Once | INTRAMUSCULAR | Status: AC | PRN
  Administered 2019-10-13: 80 mL via INTRAVENOUS

## 2019-10-13 MED ORDER — HYDROCODONE-ACETAMINOPHEN 5-325 MG PO TABS: 1.0000 | ORAL_TABLET | Freq: Four times a day (QID) | ORAL | 0 refills | Status: DC | PRN

## 2019-10-13 NOTE — ED Triage Notes (Signed)
Pt states she has been having pain in her lower stomach on rt side. Tender to touch. Pain also in lower back near same area. She reports reg BM's and no pain urinating, also denies N/V/D

## 2019-10-13 NOTE — Discharge Instructions (Signed)
You likely passed a kidney stone.   Take motrin for pain   Take vicodin for severe pain   STay hydrated   See your primary care doctor and GI doctor   Return to ER if you have worse abdominal pain, vomiting, fever.

## 2019-10-13 NOTE — ED Provider Notes (Signed)
[facility] DEPT Provider Note   CSN: [id number] Arrival date & time: 10/13/19  1347     History   Chief Complaint Chief Complaint  Patient presents with   Abdominal Pain    RLQ    HPI Kathy Thornton is a 85 y.o. female hx of gastritis, breast cancer here presenting with right lower quadrant .  Patient states that she has been having intermittent right lower quadrant pain for the last 3 weeks.  Patient states that it was cramping in nature and intermittent.  Since this morning became constant and radiates to the right flank area.  She states that she had a normal bowel movement today and no dysuria or hematuria.  She denies any history of kidney stones.  She does have a history of breast cancer and is in remission as of several months ago. Took tramadol for pain with no relief      The history is provided by the patient.    Past Medical History:  Diagnosis Date   Breast cancer ([place]) 2019   Left Breast Cancer   Breast cancer in female [facility])    Left   Cameron lesion, acute: Per EGD 11/06/2015 11/06/2015   Cataract    Erosive gastritis: per EGD 11/05/2105 11/06/2015   Family history of breast cancer    Family history of colon cancer    Heart murmur    Hemorrhoids; Grade 1 per colonoscopy 11/06/2015 11/06/2015   Knee pain     Patient Active Problem List   Diagnosis Date Noted   Genetic testing 03/27/2018   Family history of colon cancer    Family history of breast cancer    Malignant neoplasm of upper-inner quadrant of left breast in female, estrogen receptor positive ([place]) 01/02/2018   Osteoporosis 01/02/2018   Cameron lesion, acute: Per EGD 11/06/2015 11/06/2015   Erosive gastritis: per EGD 11/05/2105 11/06/2015   Hemorrhoids; Grade 1 per colonoscopy 11/06/2015 11/06/2015   Anemia, iron deficiency    Acute gastric ulcer    Anemia 11/05/2015   Symptomatic anemia 11/05/2015   Heart murmur 11/05/2015   Lower urinary  tract infectious disease 11/05/2015   Lower extremity edema 11/05/2015   Arthritis of both knees 11/04/2015    Past Surgical History:  Procedure Laterality Date   BREAST LUMPECTOMY Left 03/30/2018   BREAST LUMPECTOMY WITH RADIOACTIVE SEED LOCALIZATION Left 03/30/2018   Procedure: RADIOACTIVE SEED GUIDED LEFT BREAST LUMPECTOMY;  Surgeon: Fanny Skates, MD;  Location: [facility];  Service: General;  Laterality: Left;   COLONOSCOPY WITH PROPOFOL N/A 11/06/2015   Procedure: COLONOSCOPY WITH PROPOFOL;  Surgeon: Ladene Artist, MD;  Location: [facility];  Service: Endoscopy;  Laterality: N/A;   ESOPHAGOGASTRODUODENOSCOPY (EGD) WITH PROPOFOL N/A 11/06/2015   Procedure: ESOPHAGOGASTRODUODENOSCOPY (EGD) WITH PROPOFOL;  Surgeon: Ladene Artist, MD;  Location[facility];  Service: Endoscopy;  Laterality: N/A;   EYE SURGERY       OB History   No obstetric history on file.      Home Medications    Prior to Admission medications   Medication Sig Start Date End Date Taking? Authorizing Provider  anastrozole (ARIMIDEX) 1 MG tablet Take 1 tablet (1 mg total) by mouth daily. 08/29/19   Magrinat, Virgie Dad, MD  Cinnamon 500 MG capsule Take 1 capsule (500 mg total) by mouth daily. 06/11/18   Magrinat, Virgie Dad, MD  HYDROcodone-acetaminophen (NORCO/VICODIN) 5-325 MG tablet Take 1 tablet by mouth every 6 (six) hours as needed. 10/13/19  Drenda Freeze, MD  pantoprazole (PROTONIX) 40 MG tablet TAKE 1 TABLET(40 MG) BY MOUTH DAILY 09/13/19   Copland, Gay Filler, MD  traMADol (ULTRAM) 50 MG tablet TAKE 1 TABLET BY MOUTH EVERY 8 HOURS AS NEEDED FOR MODERATE PAIN 09/14/19   Copland, Gay Filler, MD  Turmeric 500 MG CAPS Take 1 capsule by mouth 1 day or 1 dose. 06/11/18   Magrinat, Virgie Dad, MD  Vitamin D, Ergocalciferol, 50 MCG (2000 UT) CAPS Take by mouth.    [provider]    Family History Family History  Problem Relation Age of Onset   Aneurysm Mother        brain   Heart disease  Father    Heart disease Brother    AAA (abdominal aortic aneurysm) Maternal Uncle    Colon cancer Paternal Uncle        dx >50   Breast cancer Cousin        pat cousin, dx in her late 11s    Social History Social History   Tobacco Use   Smoking status: Never Smoker   Smokeless tobacco: Never Used  Substance Use Topics   Alcohol use: No   Drug use: No     Allergies   Sulfa antibiotics   Review of Systems Review of Systems  Gastrointestinal: Positive for abdominal pain.  All other systems reviewed and are negative.    Physical Exam Updated Vital Signs BP (!) 189/105 (BP Location: Left Arm)    Pulse 94    Temp 98.5 F (36.9 C) (Oral)    Resp 16    Ht 5' (1.524 m)    Wt 63.5 kg    SpO2 98%    BMI 27.34 kg/m   Physical Exam Vitals signs and nursing note reviewed.  Constitutional:      Comments: Uncomfortable   HENT:     Head: Normocephalic.     Mouth/Throat:     Mouth: Mucous membranes are moist.  Eyes:     Extraocular Movements: Extraocular movements intact.  Cardiovascular:     Rate and Rhythm: Normal rate and regular rhythm.     Heart sounds: Normal heart sounds.  Pulmonary:     Effort: Pulmonary effort is normal.     Breath sounds: Normal breath sounds.  Abdominal:     General: Abdomen is flat. Bowel sounds are normal.     Palpations: Abdomen is soft.     Comments: + RLQ tenderness, mild guarding   Skin:    General: Skin is warm.     Capillary Refill: Capillary refill takes less than 2 seconds.  Neurological:     General: No focal deficit present.     Mental Status: She is alert and oriented to person, place, and time.  Psychiatric:        Mood and Affect: Mood normal.        Behavior: Behavior normal.      ED Treatments / Results  Labs (all labs ordered are listed, but only abnormal results are displayed) Labs Reviewed  COMPREHENSIVE METABOLIC PANEL - Abnormal; Notable for the following components:      Result Value   Glucose, Bld  101 (*)    Creatinine, Ser 1.27 (*)    AST 14 (*)    GFR calc non Af Amer 39 (*)    GFR calc Af Amer 45 (*)    All other components within normal limits  URINALYSIS, ROUTINE W REFLEX MICROSCOPIC - Abnormal; Notable for the  following components:   Hgb urine dipstick SMALL (*)    Ketones, ur 20 (*)    Leukocytes,Ua SMALL (*)    All other components within normal limits  CBC WITH DIFFERENTIAL/PLATELET  LIPASE, BLOOD    EKG None  Radiology Ct Abdomen Pelvis W Contrast  Result Date: 10/13/2019 CLINICAL DATA:  Abdominal distension, right lower abdominal pain EXAM: CT ABDOMEN AND PELVIS WITH CONTRAST TECHNIQUE: Multidetector CT imaging of the abdomen and pelvis was performed using the standard protocol following bolus administration of intravenous contrast. CONTRAST:  70mL OMNIPAQUE IOHEXOL 300 MG/ML  SOLN COMPARISON:  None. FINDINGS: Lower chest: No acute abnormality. Moderate hiatal hernia with intrathoracic position of the gastric body and fundus. Hepatobiliary: No solid liver abnormality is seen. Large calcified gallstone in the gallbladder. No gallbladder wall thickening, or biliary dilatation. Pancreas: Unremarkable. No pancreatic ductal dilatation or surrounding inflammatory changes. Spleen: Normal in size without significant abnormality. Adrenals/Urinary Tract: Adrenal glands are unremarkable. Kidneys are normal, without renal calculi, solid lesion, or hydronephrosis. Bladder is unremarkable. Stomach/Bowel: Stomach is within normal limits. Appendix appears normal. No evidence of bowel wall thickening, distention, or inflammatory changes. Vascular/Lymphatic: No significant vascular findings are present. No enlarged abdominal or pelvic lymph nodes. Reproductive: No mass or other significant abnormality. Other: No abdominal wall hernia or abnormality. No abdominopelvic ascites. Musculoskeletal: No acute or significant osseous findings. IMPRESSION: 1. No acute CT findings of the abdomen or pelvis  to explain lower right abdominal pain. 2.  Hiatal hernia. 3.  Cholelithiasis. 4.  Normal appendix. Electronically Signed   By: Eddie Candle M.D.   On: 10/13/2019 17:04    Procedures Procedures (including critical care time)  Medications Ordered in ED Medications  sodium chloride (PF) 0.9 % injection (has no administration in time range)  morphine 4 MG/ML injection 4 mg (4 mg Intravenous Given 10/13/19 1430)  ondansetron (ZOFRAN) injection 4 mg (4 mg Intravenous Given 10/13/19 1430)  iohexol (OMNIPAQUE) 300 MG/ML solution 80 mL (80 mLs Intravenous Contrast Given 10/13/19 1642)     Initial Impression / Assessment and Plan / ED Course  I have reviewed the triage vital signs and the nursing notes.  Pertinent labs & imaging results that were available during my care of the patient were reviewed by me and considered in my medical decision making (see chart for details).       Kathy Thornton is a 85 y.o. female here with RLQ pain. RLQ pain for several weeks, worse today. Consider appendicitis vs colitis vs diverticulitis vs renal colic. Will get labs, UA, CT ab/pel.   6:08 PM Labs unremarkable. UA + blood. CT unremarkable. I suspected that she passed a kidney stone. There is no hydro on CT. Will give hydrocodone for pain. Stable for discharge. No spinal tenderness and she has no saddle anesthesia and has nl strength lower extremities and I doubt spinal compression.    Final Clinical Impressions(s) / ED Diagnoses   Final diagnoses:  Hematuria, unspecified type  RLQ abdominal pain    ED Discharge Orders         Ordered    HYDROcodone-acetaminophen (NORCO/VICODIN) 5-325 MG tablet  Every 6 hours PRN     10/13/19 1752           Drenda Freeze, MD 10/13/19 [phone]

## 2019-10-14 ENCOUNTER — Telehealth: Payer: Self-pay | Admitting: *Deleted

## 2019-10-14 NOTE — Telephone Encounter (Signed)
Patient did go to ER

## 2019-10-14 NOTE — Telephone Encounter (Signed)
Who Is Calling Patient / Member / Family / Caregiver Call Type Triage / Clinical Caller Name Kathy Thornton Relationship To Patient Daughter Return Phone Number [phone] (Secondary) Chief Complaint Abdominal Pain Reason for Call Symptomatic / Request for Health Information Initial Comment Caller states her mother mother is having pain in her side and has been experiencing this off and for a few weeks now and now she thinks it may be her appendix. Translation No Nurse Assessment Nurse: Waymon Budge, RN, Vaughan Basta Date/Time (Eastern Time): 10/13/2019 12:54:09 PM Confirm and document reason for call. If symptomatic, describe symptoms. ---Caller states her mother mother is having pain in her side and has been experiencing this off and for a few weeks now. Not with mother now. Mother called and said she is in a lot of pain. Upon calling patient, she states pain low on her right side, constant. Did vomit a little this morning. Also right flank pain.  Disp. Time Eilene Ghazi Time) Disposition Final User 10/13/2019 1:12:55 PM Go to ED Now Yes Waymon Budge, RN, Vaughan Basta

## 2019-11-10 NOTE — Patient Instructions (Addendum)
It was great to see you again today, I will be in touch with your labs ASAP We will set you up for a bone density scan Please consider getting the shingles vaccine at your drug store at your convenience  Happy holidays   Health Maintenance After Age 83 After age 4, you are at a higher risk for certain long-term diseases and infections as well as injuries from falls. Falls are a major cause of broken bones and head injuries in people who are older than age 21. Getting regular preventive care can help to keep you healthy and well. Preventive care includes getting regular testing and making lifestyle changes as recommended by your health care provider. Talk with your health care provider about:  Which screenings and tests you should have. A screening is a test that checks for a disease when you have no symptoms.  A diet and exercise plan that is right for you. What should I know about screenings and tests to prevent falls? Screening and testing are the best ways to find a health problem early. Early diagnosis and treatment give you the best chance of managing medical conditions that are common after age 52. Certain conditions and lifestyle choices may make you more likely to have a fall. Your health care provider may recommend:  Regular vision checks. Poor vision and conditions such as cataracts can make you more likely to have a fall. If you wear glasses, make sure to get your prescription updated if your vision changes.  Medicine review. Work with your health care provider to regularly review all of the medicines you are taking, including over-the-counter medicines. Ask your health care provider about any side effects that may make you more likely to have a fall. Tell your health care provider if any medicines that you take make you feel dizzy or sleepy.  Osteoporosis screening. Osteoporosis is a condition that causes the bones to get weaker. This can make the bones weak and cause them to break  more easily.  Blood pressure screening. Blood pressure changes and medicines to control blood pressure can make you feel dizzy.  Strength and balance checks. Your health care provider may recommend certain tests to check your strength and balance while standing, walking, or changing positions.  Foot health exam. Foot pain and numbness, as well as not wearing proper footwear, can make you more likely to have a fall.  Depression screening. You may be more likely to have a fall if you have a fear of falling, feel emotionally low, or feel unable to do activities that you used to do.  Alcohol use screening. Using too much alcohol can affect your balance and may make you more likely to have a fall. What actions can I take to lower my risk of falls? General instructions  Talk with your health care provider about your risks for falling. Tell your health care provider if: ? You fall. Be sure to tell your health care provider about all falls, even ones that seem minor. ? You feel dizzy, sleepy, or off-balance.  Take over-the-counter and prescription medicines only as told by your health care provider. These include any supplements.  Eat a healthy diet and maintain a healthy weight. A healthy diet includes low-fat dairy products, low-fat (lean) meats, and fiber from whole grains, beans, and lots of fruits and vegetables. Home safety  Remove any tripping hazards, such as rugs, cords, and clutter.  Install safety equipment such as grab bars in bathrooms and safety rails on  stairs.  Keep rooms and walkways well-lit. Activity   Follow a regular exercise program to stay fit. This will help you maintain your balance. Ask your health care provider what types of exercise are appropriate for you.  If you need a cane or walker, use it as recommended by your health care provider.  Wear supportive shoes that have nonskid soles. Lifestyle  Do not drink alcohol if your health care provider tells you not  to drink.  If you drink alcohol, limit how much you have: ? 0-1 drink a day for women. ? 0-2 drinks a day for men.  Be aware of how much alcohol is in your drink. In the [place] one drink equals one typical bottle of beer (12 oz), one-half glass of wine (5 oz), or one shot of hard liquor (1 oz).  Do not use any products that contain nicotine or tobacco, such as cigarettes and e-cigarettes. If you need help quitting, ask your health care provider. Summary  Having a healthy lifestyle and getting preventive care can help to protect your health and wellness after age 2.  Screening and testing are the best way to find a health problem early and help you avoid having a fall. Early diagnosis and treatment give you the best chance for managing medical conditions that are more common for people who are older than age 22.  Falls are a major cause of broken bones and head injuries in people who are older than age 23. Take precautions to prevent a fall at home.  Work with your health care provider to learn what changes you can make to improve your health and wellness and to prevent falls. This information is not intended to replace advice given to you by your health care provider. Make sure you discuss any questions you have with your health care provider. Document Released: 10/04/2017 Document Revised: 03/14/2019 Document Reviewed: 10/04/2017 Elsevier Patient Education  2020 [employer].

## 2019-11-10 NOTE — Progress Notes (Addendum)
[facility] [address], [place], [place] [address] [phone]  Date:  11/11/2019   Name:  Kathy Thornton   DOB:  [birth year]   MRN:  [id number]  PCP:  Darreld Mclean, MD    Chief Complaint: Annual Exam   History of Present Illness:  Kathy Thornton is a 85 y.o. very pleasant female patient who presents with the following:  Here today for routine physical History of breast cancer diagnosed 2018, osteoporosis, arthritis, history of ulcer She does take tramadol routinely for her arthritis pain-mostly knee pain.  Generally takes 1 daily Seen in the ER about 1 month ago for suspected kidney stone She had pain in her side for 3-4 weeks but this is now resolved  She has not had a kidney stone in the past.  She is feeling fine right now, I do notice that her renal function was reduced from baseline in the ER-we will check this for her again today She has not had any steroid shots in her knees for about 8 months  Bone density due-will order for patient Mammogram up-to-date Needs BMP, lipids-CBC up-to-date  She saw her oncologist, Dr. Jana Hakim, in September.  She continues to take Anastrozole daily post breast cancer.  She was asked to follow-up in 1 year  Immunizations up-to-date except Shingrix-mentioned this vaccine to her, she will consider having this given at her drugstore  Here today with [name] Married to Timmothy Sours Today [name] states that she is feeling well, she really has no other concerns  Patient Active Problem List   Diagnosis Date Noted  . Genetic testing 03/27/2018  . Family history of colon cancer   . Family history of breast cancer   . Malignant neoplasm of upper-inner quadrant of left breast in female, estrogen receptor positive ([name]) 01/02/2018  . Osteoporosis 01/02/2018  . [name] lesion, acute: Per EGD 11/06/2015 11/06/2015  . Erosive gastritis: per EGD 11/05/2105 11/06/2015  .  Hemorrhoids; Grade 1 per colonoscopy 11/06/2015 11/06/2015  . Anemia, iron deficiency   . Acute gastric ulcer   . Anemia 11/05/2015  . Symptomatic anemia 11/05/2015  . Heart murmur 11/05/2015  . Lower urinary tract infectious disease 11/05/2015  . Lower extremity edema 11/05/2015  . Arthritis of both knees 11/04/2015    Past Medical History:  Diagnosis Date  . Breast cancer ([place]) 2019   Left Breast Cancer  . Breast cancer in female [facility])    Left  . [name] lesion, acute: Per EGD 11/06/2015 11/06/2015  . Cataract   . Erosive gastritis: per EGD 11/05/2105 11/06/2015  . Family history of breast cancer   . Family history of colon cancer   . Heart murmur   . Hemorrhoids; Grade 1 per colonoscopy 11/06/2015 11/06/2015  . Knee pain     Past Surgical History:  Procedure Laterality Date  . BREAST LUMPECTOMY Left 03/30/2018  . BREAST LUMPECTOMY WITH RADIOACTIVE SEED LOCALIZATION Left 03/30/2018   Procedure: RADIOACTIVE SEED GUIDED LEFT BREAST LUMPECTOMY;  Surgeon: Fanny Skates, MD;  Location: [facility] [place];  Service: General;  Laterality: Left;  . COLONOSCOPY WITH PROPOFOL N/A 11/06/2015   Procedure: COLONOSCOPY WITH PROPOFOL;  Surgeon: Ladene Artist, MD;  [facility];  Service: Endoscopy;  Laterality: N/A;  . ESOPHAGOGASTRODUODENOSCOPY (EGD) WITH PROPOFOL N/A 11/06/2015   Procedure: ESOPHAGOGASTRODUODENOSCOPY (EGD) WITH PROPOFOL;  Surgeon: Ladene Artist, MD;  [facility];  Service: Endoscopy;  Laterality: N/A;  .  EYE SURGERY      Social History   Tobacco Use  . Smoking status: Never Smoker  . Smokeless tobacco: Never Used  Substance Use Topics  . Alcohol use: No  . Drug use: No    Family History  Problem Relation Age of Onset  . Aneurysm Mother        brain  . Heart disease Father   . Heart disease Brother   . AAA (abdominal aortic aneurysm) Maternal Uncle   . Colon cancer Paternal Uncle        dx >50  . Breast cancer Cousin        pat cousin, dx in her  late 67s    Allergies  Allergen Reactions  . Sulfa Antibiotics Hives    Medication list has been reviewed and updated.  Current Outpatient Medications on File Prior to Visit  Medication Sig Dispense Refill  . anastrozole (ARIMIDEX) 1 MG tablet Take 1 tablet (1 mg total) by mouth daily. 90 tablet 4  . Cinnamon 500 MG capsule Take 1 capsule (500 mg total) by mouth daily.    Marland Kitchen HYDROcodone-acetaminophen (NORCO/VICODIN) 5-325 MG tablet Take 1 tablet by mouth every 6 (six) hours as needed. 8 tablet 0  . pantoprazole (PROTONIX) 40 MG tablet TAKE 1 TABLET(40 MG) BY MOUTH DAILY 90 tablet 1  . traMADol (ULTRAM) 50 MG tablet TAKE 1 TABLET BY MOUTH EVERY 8 HOURS AS NEEDED FOR MODERATE PAIN 30 tablet 3  . Turmeric 500 MG CAPS Take 1 capsule by mouth 1 day or 1 dose.    . Vitamin D, Ergocalciferol, 50 MCG (2000 UT) CAPS Take by mouth.     No current facility-administered medications on file prior to visit.     Review of Systems:  As per HPI- otherwise negative.  No fever or chills, no chest pain or shortness of breath, no postmenopausal bleeding, no digestion concerns Physical Examination: Vitals:   11/11/19 1453  BP: 130/80  Pulse: 77  Resp: 16  Temp: (!) 93.5 F (34.2 C)  SpO2: 99%   Vitals:   11/11/19 1453  Weight: 144 lb (65.3 kg)  Height: 5' (1.524 m)   Body mass index is 28.12 kg/m. Ideal Body Weight: Weight in (lb) to have BMI = 25: 127.7  GEN: WDWN, NAD, Non-toxic, A & O x 3, normal weight, looks well HEENT: Atraumatic, Normocephalic. Neck supple. No masses, No LAD.  TM within normal limits bilaterally Ears and Nose: No external deformity. CV: RRR, No M/G/R. No JVD. No thrill. No extra heart sounds. PULM: CTA B, no wheezes, crackles, rhonchi. No retractions. No resp. distress. No accessory muscle use. ABD: S, NT, ND, +BS. No rebound. No HSM. EXTR: No c/c/e NEURO Normal gait.  PSYCH: Normally interactive. Conversant. Not depressed or anxious appearing.  Calm demeanor.     Assessment and Plan: Physical exam  Primary osteoarthritis of both knees  Arthritis of both knees  Ductal carcinoma in situ (DCIS) of left breast  Age-related osteoporosis without current pathological fracture  History of gastric ulcer  Screening for hyperlipidemia - Plan: Lipid panel  Medication management - Plan: Basic metabolic panel  Estrogen deficiency - Plan: DG Bone Density  Here today for routine physical History of breast cancer and gastric ulcer Otherwise patient is doing remarkably well, she feels fine Ordered routine labs for her, encouraged shingles vaccine, ordered bone density Her renal function was recently decreased, follow-up on this today  Will plan further follow- up pending labs.  This visit  occurred during the SARS-CoV-2 public health emergency.  Safety protocols were in place, including screening questions prior to the visit, additional usage of staff PPE, and extensive cleaning of exam room while observing appropriate contact time as indicated for disinfecting solutions.    Signed Lamar Blinks, MD  Received her labs, letter to pt  Results for orders placed or performed in visit on [id number]  Basic metabolic panel  Result Value Ref Range   Sodium 140 135 - 145 mEq/L   Potassium 3.9 3.5 - 5.1 mEq/L   Chloride 105 96 - 112 mEq/L   CO2 27 19 - 32 mEq/L   Glucose, Bld 93 70 - 99 mg/dL   BUN 15 6 - 23 mg/dL   Creatinine, Ser 1.00 0.40 - 1.20 mg/dL   GFR 52.85 (L) >60.00 mL/min   Calcium 9.8 8.4 - 10.5 mg/dL  Lipid panel  Result Value Ref Range   Cholesterol 159 0 - 200 mg/dL   Triglycerides 58.0 0.0 - 149.0 mg/dL   HDL 69.40 >39.00 mg/dL   VLDL 11.6 0.0 - 40.0 mg/dL   LDL Cholesterol 78 0 - 99 mg/dL   Total CHOL/HDL Ratio 2    NonHDL 89.53    Renal function improved to baseline

## 2019-11-11 ENCOUNTER — Other Ambulatory Visit: Payer: Self-pay

## 2019-11-11 ENCOUNTER — Ambulatory Visit (INDEPENDENT_AMBULATORY_CARE_PROVIDER_SITE_OTHER): Payer: Medicare Other | Admitting: Family Medicine

## 2019-11-11 ENCOUNTER — Encounter: Payer: Self-pay | Admitting: Family Medicine

## 2019-11-11 VITALS — BP 130/80 | HR 77 | Temp 93.5°F | Resp 16 | Ht 60.0 in | Wt 144.0 lb

## 2019-11-11 DIAGNOSIS — M810 Age-related osteoporosis without current pathological fracture: Secondary | ICD-10-CM

## 2019-11-11 DIAGNOSIS — Z0000 Encounter for general adult medical examination without abnormal findings: Secondary | ICD-10-CM

## 2019-11-11 DIAGNOSIS — M170 Bilateral primary osteoarthritis of knee: Secondary | ICD-10-CM

## 2019-11-11 DIAGNOSIS — E2839 Other primary ovarian failure: Secondary | ICD-10-CM

## 2019-11-11 DIAGNOSIS — D0512 Intraductal carcinoma in situ of left breast: Secondary | ICD-10-CM

## 2019-11-11 DIAGNOSIS — Z8719 Personal history of other diseases of the digestive system: Secondary | ICD-10-CM | POA: Diagnosis not present

## 2019-11-11 DIAGNOSIS — Z13220 Encounter for screening for lipoid disorders: Secondary | ICD-10-CM

## 2019-11-11 DIAGNOSIS — Z8711 Personal history of peptic ulcer disease: Secondary | ICD-10-CM

## 2019-11-11 DIAGNOSIS — Z79899 Other long term (current) drug therapy: Secondary | ICD-10-CM | POA: Diagnosis not present

## 2019-11-12 LAB — LIPID PANEL
Cholesterol: 159 mg/dL (ref 0–200)
HDL: 69.4 mg/dL (ref >39.00)
LDL Cholesterol: 78 mg/dL (ref 0–99)
NonHDL: 89.53
Total CHOL/HDL Ratio: 2
Triglycerides: 58 mg/dL (ref 0.0–149.0)
VLDL: 11.6 mg/dL (ref 0.0–40.0)

## 2019-11-12 LAB — BASIC METABOLIC PANEL
BUN: 15 mg/dL (ref 6–23)
CO2: 27 mEq/L (ref 19–32)
Calcium: 9.8 mg/dL (ref 8.4–10.5)
Chloride: 105 mEq/L (ref 96–112)
Creatinine, Ser: 1 mg/dL (ref 0.40–1.20)
GFR: 52.85 mL/min — ABNORMAL LOW (ref >60.00)
Glucose, Bld: 93 mg/dL (ref 70–99)
Potassium: 3.9 mEq/L (ref 3.5–5.1)
Sodium: 140 mEq/L (ref 135–145)

## 2019-12-26 ENCOUNTER — Other Ambulatory Visit: Payer: Self-pay

## 2019-12-26 ENCOUNTER — Ambulatory Visit (HOSPITAL_BASED_OUTPATIENT_CLINIC_OR_DEPARTMENT_OTHER)
Admission: RE | Admit: 2019-12-26 | Discharge: 2019-12-26 | Disposition: A | Discharge status: Home / Self Care | Payer: Medicare Other | Source: Ambulatory Visit | Attending: Family Medicine | Admitting: Family Medicine

## 2019-12-26 DIAGNOSIS — Z780 Asymptomatic menopausal state: Secondary | ICD-10-CM | POA: Diagnosis not present

## 2019-12-26 DIAGNOSIS — M810 Age-related osteoporosis without current pathological fracture: Secondary | ICD-10-CM | POA: Diagnosis not present

## 2019-12-26 DIAGNOSIS — E2839 Other primary ovarian failure: Secondary | ICD-10-CM

## 2019-12-30 ENCOUNTER — Encounter: Payer: Self-pay | Admitting: Family Medicine

## 2020-01-10 ENCOUNTER — Telehealth: Payer: Self-pay | Admitting: Family Medicine

## 2020-01-10 DIAGNOSIS — M170 Bilateral primary osteoarthritis of knee: Secondary | ICD-10-CM

## 2020-01-10 MED ORDER — TRAMADOL HCL 50 MG PO TABS: ORAL_TABLET | ORAL | 3 refills | Status: DC

## 2020-01-10 NOTE — Telephone Encounter (Signed)
Medication:  traMADol (ULTRAM) 50 MG tablet [id number]     Has the patient contacted their pharmacy? Yes.    Preferred Pharmacy ([facility], [place], [place] [address] [phone]     Agent: Please be advised that RX refills may take up to 3 business days. We ask that you follow-up with your pharmacy.

## 2020-01-10 NOTE — Telephone Encounter (Signed)
Requesting:Tramadol Contract:none [id number] Last Visit:11/11/2019 Next Visit:05/13/2020 Last Refill:09/14/2019  Please Advise

## 2020-02-03 DIAGNOSIS — M170 Bilateral primary osteoarthritis of knee: Secondary | ICD-10-CM | POA: Diagnosis not present

## 2020-02-10 ENCOUNTER — Other Ambulatory Visit: Payer: Self-pay

## 2020-02-11 ENCOUNTER — Other Ambulatory Visit: Payer: Self-pay

## 2020-02-11 NOTE — Progress Notes (Addendum)
[facility] [place], [place], [place] [address] [phone]  Date:  02/12/2020   Name:  Kathy Thornton   DOB:  December 14, 1933   MRN:  [id number]  PCP:  Darreld Mclean, MD    Chief Complaint: Joint Pain (body "hurts all over" pain with laying down at night) and Dizziness (vertigo, black out spells)   History of Present Illness:  Kathy Thornton is a 86 y.o. very pleasant female patient who presents with the following:  Here today for follow-up visit Today she is concerned about body aches attributed to cancer medication, and also low blood pressure Last seen by myself for a physical in December  History of breast cancer diagnosed in 2018, osteoporosis, arthritis, history of ulcer 2018 or 2019 due to NSAID use.  She takes tramadol routinely for arthritis pain, most severe in her knees We have to be cautious with NSAIDs given history of ulcer  Her oncologist is Dr. Jana Hakim, she is taking Anastrozole daily Last visit with oncology was in September, she is asked to follow-up annually  Married to [place], her daughter [name] is very involved  We did a BMP, lipid in December, CBC in November  She does check her BP at home- her BP has been ok, not too low She has felt lightheaded for the last few weeks- it does not sound like she has vertigo  No blood in her stools Her joints ache a lot at night- she thinks this might be due to taking her anastrazole; she stopped taking it for a few days as an experiment and has felt better I will touch base with Dr Jana Hakim about this issue She is using tramadol as needed for joint pain that does help   She has noted pre-syncopal episodes at variable intervals for the last 2 months She has not actually passed out The symptoms do tend to occur more if she changes position, such as standing up from bending over or rising from seated.  No CP no SOB, no palpations   01/10/2020  1    01/10/2020  Tramadol Hcl 50 MG Tablet  30.00  10 Je Cop   [id number]   [facility] (2191)   0  15.00 MME  Medicare   [place]  12/12/2019  1   09/14/2019  Tramadol Hcl 50 MG Tablet  30.00  10 Je Cop   [id number]   [facility] (2191)   3  15.00 MME  Medicare   [place]  11/13/2019  1   09/14/2019  Tramadol Hcl 50 MG Tablet  30.00  10 Je Cop   [id number]   [facility] (2191)   2  15.00 MME  Medicare   [place]  10/14/2019  1   10/13/2019  Hydrocodone-Acetamin 5-325 MG  8.00  2 Da Darl Householder   [id number]   [facility] (2191)   0  20.00 MME  Comm Ins   [place]  10/14/2019  1   09/14/2019  Tramadol Hcl 50 MG Tablet  30.00  10 Je Cop   [id number]   [facility] (2191)   1  15.00 MME  Comm Ins   [place]  09/14/2019  1   09/14/2019  Tramadol Hcl 50 MG Tablet  30.00  10 Je Cop   [id number]   [facility] (2191)   0  15.00 MME  Comm Ins   [place]  08/14/2019  1   08/14/2019  Tramadol Hcl 50 MG Tablet  30.00  [place]   [id number] (2191)   0  15.00 MME  Comm Ins   [place]  07/15/2019  1   04/22/2019  Tramadol Hcl 50 MG Tablet  30.00  10 Je Cop   [id number]   [facility] (2191)   0  15.00 MME       Patient Active Problem List   Diagnosis Date Noted  . Genetic testing 03/27/2018  . Family history of colon cancer   . Family history of breast cancer   . Malignant neoplasm of upper-inner quadrant of left breast in female, estrogen receptor positive ([place]) 01/02/2018  . Osteoporosis 01/02/2018  . [name] lesion, acute: Per EGD 11/06/2015 11/06/2015  . Erosive gastritis: per EGD 11/05/2105 11/06/2015  . Hemorrhoids; Grade 1 per colonoscopy 11/06/2015 11/06/2015  . Anemia, iron deficiency   . Acute gastric ulcer   . Anemia 11/05/2015  . Symptomatic anemia 11/05/2015  . Heart murmur 11/05/2015  . Lower urinary tract infectious disease 11/05/2015  . Lower extremity edema 11/05/2015  . Arthritis of both knees 11/04/2015    Past Medical History:  Diagnosis Date  . Breast cancer ([place]) 2019   Left Breast Cancer  . Breast cancer in female [facility])    Left  . [name] lesion, acute: Per EGD 11/06/2015 11/06/2015  . Cataract   .  Erosive gastritis: per EGD 11/05/2105 11/06/2015  . Family history of breast cancer   . Family history of colon cancer   . Heart murmur   . Hemorrhoids; Grade 1 per colonoscopy 11/06/2015 11/06/2015  . Knee pain     Past Surgical History:  Procedure Laterality Date  . BREAST LUMPECTOMY Left 03/30/2018  . BREAST LUMPECTOMY WITH RADIOACTIVE SEED LOCALIZATION Left 03/30/2018   Procedure: RADIOACTIVE SEED GUIDED LEFT BREAST LUMPECTOMY;  Surgeon: Fanny Skates, MD;  Location: [facility];  Service: General;  Laterality: Left;  . COLONOSCOPY WITH PROPOFOL N/A 11/06/2015   Procedure: COLONOSCOPY WITH PROPOFOL;  Surgeon: Ladene Artist, MD;  Location: [facility];  Service: Endoscopy;  Laterality: N/A;  . ESOPHAGOGASTRODUODENOSCOPY (EGD) WITH PROPOFOL N/A 11/06/2015   Procedure: ESOPHAGOGASTRODUODENOSCOPY (EGD) WITH PROPOFOL;  Surgeon: Ladene Artist, MD;  Location: [facility];  Service: Endoscopy;  Laterality: N/A;  . EYE SURGERY      Social History   Tobacco Use  . Smoking status: Never Smoker  . Smokeless tobacco: Never Used  Substance Use Topics  . Alcohol use: No  . Drug use: No    Family History  Problem Relation Age of Onset  . Aneurysm Mother        brain  . Heart disease Father   . Heart disease Brother   . AAA (abdominal aortic aneurysm) Maternal Uncle   . Colon cancer Paternal Uncle        dx >50  . Breast cancer Cousin        pat cousin, dx in her late 54s    Allergies  Allergen Reactions  . Sulfa Antibiotics Hives    Medication list has been reviewed and updated.  Current Outpatient Medications on File Prior to Visit  Medication Sig Dispense Refill  . anastrozole (ARIMIDEX) 1 MG tablet Take 1 tablet (1 mg total) by mouth daily. 90 tablet 4  . Cinnamon 500 MG capsule Take 1 capsule (500 mg total) by mouth daily.    [place] Kitchen HYDROcodone-acetaminophen (NORCO/VICODIN) 5-325 MG tablet Take 1 tablet by mouth every 6 (six) hours as needed. 8 tablet 0  . pantoprazole (PROTONIX)  40  MG tablet TAKE 1 TABLET(40 MG) BY MOUTH DAILY 90 tablet 1  . traMADol (ULTRAM) 50 MG tablet TAKE 1 TABLET BY MOUTH EVERY 8 HOURS AS NEEDED FOR MODERATE PAIN 30 tablet 3  . Turmeric 500 MG CAPS Take 1 capsule by mouth 1 day or 1 dose.    . Vitamin D, Ergocalciferol, 50 MCG (2000 UT) CAPS Take by mouth.     No current facility-administered medications on file prior to visit.    Review of Systems:  As per HPI- otherwise negative.   Physical Examination: Vitals:   02/12/20 1316  Resp: 16  Temp: (!) 97.3 F (36.3 C)  SpO2: 98%   Vitals:   02/12/20 1316  Weight: 145 lb (65.8 kg)  Height: 5' (1.524 m)   Body mass index is 28.32 kg/m. Ideal Body Weight: Weight in (lb) to have BMI = 25: 127.7  GEN: no acute distress.  Normal weight for age, looks well HEENT: Atraumatic, Normocephalic.   Bilateral TM wnl, oropharynx normal.  PEERL,EOMI.   Ears and Nose: No external deformity. CV: RRR, No M/G/R. No JVD. No thrill. No extra heart sounds. PULM: CTA B, no wheezes, crackles, rhonchi. No retractions. No resp. distress. No accessory muscle use. ABD: S, NT, ND, +BS. No rebound. No HSM.  Benign belly EXTR: No c/c/e PSYCH: Normally interactive. Conversant.   Orthostatics are ok- pulse came up 12 BPM, blood pressure relatively stable  EKG: NSR, rate 72. C/w tracing from 12/16- no significant change noted  Assessment and Plan: Lightheaded - Plan: TSH, EKG 12-Lead  History of ulcer disease - Plan: CBC, Comprehensive metabolic panel  Body aches - Plan: CK, Sedimentation rate  Patient here today with what sounds like lightheadedness and presyncopal symptoms.  She has not actually lost consciousness.  She denies any chest pain, shortness of breath, palpitations.  She does have history of ulcer, we will check her CBC today.  She also notes body aches which she attributes to her antiandrogen therapy-she has stopped taking this for the last several days and does feel somewhat better.  I will  touch base with her oncologist about the necessity of continuing this for the full 5 years  I have encouraged her to increase her salt and fluid intake to help prevent orthostatic hypotension.  If her labs are normal and symptoms continue, plan to refer her to cardiology  She will let me know if any worsening of symptoms or other update  Moderate medical decision making today  This visit occurred during the SARS-CoV-2 public health emergency.  Safety protocols were in place, including screening questions prior to the visit, additional usage of staff PPE, and extensive cleaning of exam room while observing appropriate contact time as indicated for disinfecting solutions.    Signed Lamar Blinks, MD  Contact pt and daughter regarding her labs if any concerns Heard back from her oncologist about pausing her antiandrogen therapy:   It is very safe to stop the medication for a month or so. I usually ask them to write down their symptoms before stopping and then re evaluate (preferably again in writing) after 4-6 off the medication. If she feels considerably better we need to find a substitute.   Received her labs 3/12- letter to pt.  I could not find Cynthia's phone number to call her  Results for orders placed or performed in visit on 02/12/20  CBC  Result Value Ref Range   WBC 7.1 4.0 - 10.5 K/uL   RBC 4.07 3.87 -  5.11 Mil/uL   Platelets 197.0 150.0 - 400.0 K/uL   Hemoglobin 12.7 12.0 - 15.0 g/dL   HCT 37.6 36.0 - 46.0 %   MCV 92.4 78.0 - 100.0 fl   MCHC 33.9 30.0 - 36.0 g/dL   RDW 14.3 11.5 - 15.5 %  Comprehensive metabolic panel  Result Value Ref Range   Sodium 137 135 - 145 mEq/L   Potassium 4.4 3.5 - 5.1 mEq/L   Chloride 103 96 - 112 mEq/L   CO2 28 19 - 32 mEq/L   Glucose, Bld 86 70 - 99 mg/dL   BUN 15 6 - 23 mg/dL   Creatinine, Ser 1.00 0.40 - 1.20 mg/dL   Total Bilirubin 0.5 0.2 - 1.2 mg/dL   Alkaline Phosphatase 88 39 - 117 U/L   AST 11 0 - 37 U/L   ALT 8 0 - 35 U/L    Total Protein 6.5 6.0 - 8.3 g/dL   Albumin 4.0 3.5 - 5.2 g/dL   GFR 52.82 (L) >60.00 mL/min   Calcium 9.7 8.4 - 10.5 mg/dL  TSH  Result Value Ref Range   TSH 1.34 0.35 - 4.50 uIU/mL  CK  Result Value Ref Range   Total CK 46 7 - 177 U/L  Sedimentation rate  Result Value Ref Range   Sed Rate 8 0 - 30 mm/hr

## 2020-02-11 NOTE — Patient Instructions (Addendum)
It was great to see you again today, please keep me posted about your symptoms Please increase your salt intake a bit, and consider adding a serving of Gatorade to your daily regimen If your labs look normal but symptoms continue I would suggest having your see cardiology for a consultation

## 2020-02-12 ENCOUNTER — Encounter: Payer: Self-pay | Admitting: Family Medicine

## 2020-02-12 ENCOUNTER — Other Ambulatory Visit: Payer: Self-pay

## 2020-02-12 ENCOUNTER — Ambulatory Visit (INDEPENDENT_AMBULATORY_CARE_PROVIDER_SITE_OTHER): Payer: Medicare Other | Admitting: Family Medicine

## 2020-02-12 VITALS — Temp 97.3°F | Resp 16 | Ht 60.0 in | Wt 145.0 lb

## 2020-02-12 DIAGNOSIS — Z87898 Personal history of other specified conditions: Secondary | ICD-10-CM | POA: Diagnosis not present

## 2020-02-12 DIAGNOSIS — R52 Pain, unspecified: Secondary | ICD-10-CM

## 2020-02-12 DIAGNOSIS — R42 Dizziness and giddiness: Secondary | ICD-10-CM | POA: Diagnosis not present

## 2020-02-12 LAB — CBC
HCT: 37.6 % (ref 36.0–46.0)
Hemoglobin: 12.7 g/dL (ref 12.0–15.0)
MCHC: 33.9 g/dL (ref 30.0–36.0)
MCV: 92.4 fl (ref 78.0–100.0)
Platelets: 197 10*3/uL (ref 150.0–400.0)
RBC: 4.07 Mil/uL (ref 3.87–5.11)
RDW: 14.3 % (ref 11.5–15.5)
WBC: 7.1 10*3/uL (ref 4.0–10.5)

## 2020-02-12 LAB — TSH: TSH: 1.34 u[IU]/mL (ref 0.35–4.50)

## 2020-02-12 LAB — COMPREHENSIVE METABOLIC PANEL
ALT: 8 U/L (ref 0–35)
AST: 11 U/L (ref 0–37)
Albumin: 4 g/dL (ref 3.5–5.2)
Alkaline Phosphatase: 88 U/L (ref 39–117)
BUN: 15 mg/dL (ref 6–23)
CO2: 28 mEq/L (ref 19–32)
Calcium: 9.7 mg/dL (ref 8.4–10.5)
Chloride: 103 mEq/L (ref 96–112)
Creatinine, Ser: 1 mg/dL (ref 0.40–1.20)
GFR: 52.82 mL/min — ABNORMAL LOW (ref >60.00)
Glucose, Bld: 86 mg/dL (ref 70–99)
Potassium: 4.4 mEq/L (ref 3.5–5.1)
Sodium: 137 mEq/L (ref 135–145)
Total Bilirubin: 0.5 mg/dL (ref 0.2–1.2)
Total Protein: 6.5 g/dL (ref 6.0–8.3)

## 2020-02-12 LAB — CK: Total CK: 46 U/L (ref 7–177)

## 2020-02-12 LAB — SEDIMENTATION RATE: Sed Rate: 8 mm/hr (ref 0–30)

## 2020-04-15 DIAGNOSIS — M1611 Unilateral primary osteoarthritis, right hip: Secondary | ICD-10-CM | POA: Diagnosis not present

## 2020-04-17 ENCOUNTER — Telehealth: Payer: Self-pay | Admitting: Family Medicine

## 2020-04-17 DIAGNOSIS — Z8711 Personal history of peptic ulcer disease: Secondary | ICD-10-CM

## 2020-04-17 MED ORDER — PANTOPRAZOLE SODIUM 40 MG PO TBEC: DELAYED_RELEASE_TABLET | ORAL | 1 refills | Status: DC

## 2020-04-17 NOTE — Telephone Encounter (Signed)
Medication refilled

## 2020-04-17 NOTE — Telephone Encounter (Signed)
Medication: pantoprazole (PROTONIX) 40 MG tablet  Has the patient contacted their pharmacy? No. (If no, request that the patient contact the pharmacy for the refill.) (If yes, when and what did the pharmacy advise?)  Preferred Pharmacy (with phone number or street name): [facility], [place] - [address]  [place], [place] [address]  Phone:  [phone] Fax:  [phone]  DEA #:  [id number]    Agent: Please be advised that RX refills may take up to 3 business days. We ask that you follow-up with your pharmacy.

## 2020-04-28 ENCOUNTER — Telehealth: Payer: Self-pay | Admitting: Family Medicine

## 2020-04-28 NOTE — Progress Notes (Signed)
  Chronic Care Management   Outreach Note  04/28/2020 Name: Kathy Thornton MRN: [id number] DOB: [birth year]  Referred by: Darreld Mclean, MD Reason for referral : No chief complaint on file.   An unsuccessful telephone outreach was attempted today. The patient was referred to the pharmacist for assistance with care management and care coordination.   This note is not being shared with the patient for the following reason: To respect privacy (The patient or proxy has requested that the information not be shared).  Follow Up Plan:   Earney Hamburg Upstream Scheduler

## 2020-04-29 ENCOUNTER — Telehealth: Payer: Self-pay | Admitting: Family Medicine

## 2020-04-29 NOTE — Progress Notes (Signed)
  Chronic Care Management   Note  04/29/2020 Name: CHARLY PETTAWAY MRN: [id number] DOB: 1934-02-27  GEISHA BOGENSCHUTZ is a 86 y.o. year old female who is a primary care patient of Copland, Gay Filler, MD. I reached out to Jennell Corner by phone today in response to a referral sent by Ms. Barron Schmid Camplin's PCP, Copland, Gay Filler, MD.   Ms. Budhu was given information about Chronic Care Management services today including:  1. CCM service includes personalized support from designated clinical staff supervised by her physician, including individualized plan of care and coordination with other care providers 2. 24/7 contact phone numbers for assistance for urgent and routine care needs. 3. Service will only be billed when office clinical staff spend 20 minutes or more in a month to coordinate care. 4. Only one practitioner may furnish and bill the service in a calendar month. 5. The patient may stop CCM services at any time (effective at the end of the month) by phone call to the office staff.   Patient did not agree to enrollment in care management services and does not wish to consider at this time.  Follow up plan:   Earney Hamburg Upstream Scheduler

## 2020-05-06 ENCOUNTER — Inpatient Hospital Stay: Payer: Medicare Other | Attending: Oncology | Admitting: Oncology

## 2020-05-06 ENCOUNTER — Other Ambulatory Visit: Payer: Self-pay

## 2020-05-06 VITALS — BP 166/65 | HR 65 | Temp 98.2°F | Resp 18 | Ht 60.0 in | Wt 143.6 lb

## 2020-05-06 DIAGNOSIS — Z170 Estrogen receptor positive status [ER+]: Secondary | ICD-10-CM

## 2020-05-06 DIAGNOSIS — M818 Other osteoporosis without current pathological fracture: Secondary | ICD-10-CM | POA: Diagnosis not present

## 2020-05-06 DIAGNOSIS — Z800 Family history of malignant neoplasm of digestive organs: Secondary | ICD-10-CM | POA: Diagnosis not present

## 2020-05-06 DIAGNOSIS — C50212 Malignant neoplasm of upper-inner quadrant of left female breast: Secondary | ICD-10-CM | POA: Diagnosis not present

## 2020-05-06 DIAGNOSIS — R232 Flushing: Secondary | ICD-10-CM | POA: Insufficient documentation

## 2020-05-06 DIAGNOSIS — Z79811 Long term (current) use of aromatase inhibitors: Secondary | ICD-10-CM | POA: Diagnosis not present

## 2020-05-06 DIAGNOSIS — Z79899 Other long term (current) drug therapy: Secondary | ICD-10-CM | POA: Insufficient documentation

## 2020-05-06 DIAGNOSIS — Z803 Family history of malignant neoplasm of breast: Secondary | ICD-10-CM | POA: Insufficient documentation

## 2020-05-06 DIAGNOSIS — M25569 Pain in unspecified knee: Secondary | ICD-10-CM | POA: Insufficient documentation

## 2020-05-06 DIAGNOSIS — Z8249 Family history of ischemic heart disease and other diseases of the circulatory system: Secondary | ICD-10-CM | POA: Diagnosis not present

## 2020-05-06 DIAGNOSIS — Z853 Personal history of malignant neoplasm of breast: Secondary | ICD-10-CM | POA: Diagnosis not present

## 2020-05-06 NOTE — Progress Notes (Signed)
[facility]  [phone] [phone]     ID: Kathy Thornton DOB: [birth year]  MR#: [id number]  XKP#:[id number]  Patient Care Team: Darreld Mclean, MD as PCP - General (Family Medicine) Kathy Server, MD as Consulting Physician (Orthopedic Surgery) Kathy Mayer, MD as Consulting Physician (Gastroenterology) Kathy Thornton, Kathy Dad, MD as Consulting Physician (Oncology) Kathy Skates, MD as Consulting Physician (General Surgery) OTHER MD:  CHIEF COMPLAINT: Estrogen receptor positive breast cancer  CURRENT TREATMENT: Observation   INTERVAL HISTORY: Kathy Thornton returns today for follow-up of her estrogen receptor positive breast cancer accompanied by her daughter.   Newell had been on anastrozole, which she originally tolerated fairly well, but which over the past several months began to cause her severe arthralgias and myalgias, foggy headedness, and insomnia.  They contacted [place] and we suggested going off the medication which they did about 8 weeks ago.  The result is that although symptoms significantly improved as did her hot flashes.  Since her last visit, she underwent repeat bone density screening on 12/26/2019 showing a T-score of -3.4, which is worse compared to prior score of -2.5 in 10/2017.  She presented to the ED on 10/13/2019 with hematuria and right lower quadrant pain. CT abdomen/pelvis performed at that time showed: no acute findings to explain lower right abdominal pain. Per ED note, it was suspected that she passed a kidney stone.   REVIEW OF SYSTEMS: Kathy Thornton is now walking about a half a mile at a time.  She uses 2 canes to help her walk.  She does get "shots" in her knee and hip.  She has not yet had the vaccines but is planning on it she says.  There have been no unusual headaches visual changes cough phlegm production pleurisy shortness of breath or change in bowel or bladder habits.  A detailed review of systems today was otherwise  stable.   HISTORY OF CURRENT ILLNESS:  From the original intake note:  Kathy Thornton had routine screening mammography at Dr. Arlyn Thornton on 10/30/2017 showing a possible abnormality in the left breast. She underwent bilateral diagnostic mammography with tomography and left breast ultrasonography at the [facility] on 12/19/2017 showing:  a highly suspicious mass in the left breast at 11 o'clock. No evidence of left axillary lymphadenopathy. Ultrasound targeted to the left breast at 11 o'clock, 5 cm from the nipple demonstrates an ill-defined irregular vascular mass with spiculated margins measuring 8 x 6 x 6 mm. Ultrasound of the left axilla demonstrates multiple normal-appearing lymph nodes.  Accordingly on 12/22/2017 she proceeded to biopsy of the left breast area in question. 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 Prognostic indicators significant for: ER, 90% positive and PR, 80% positive, both with strong staining intensity. Proliferation marker Ki67 at 3%. HER2 negative via [place].  The patient's subsequent history is as detailed below.   PAST MEDICAL HISTORY: Past Medical History:  Diagnosis Date   Breast cancer ([place]) 2019   Left Breast Cancer   Breast cancer in female [facility])    Left   [name] lesion, acute: Per EGD 11/06/2015 11/06/2015   Cataract    Erosive gastritis: per EGD 11/05/2105 11/06/2015   Family history of breast cancer    Family history of colon cancer    Heart murmur    Hemorrhoids; Grade 1 per colonoscopy 11/06/2015 11/06/2015   Knee pain  PAST SURGICAL HISTORY: Past Surgical History:  Procedure Laterality Date   BREAST LUMPECTOMY Left 03/30/2018   BREAST LUMPECTOMY WITH RADIOACTIVE SEED LOCALIZATION Left 03/30/2018   Procedure: RADIOACTIVE SEED GUIDED LEFT BREAST LUMPECTOMY;  Surgeon:  Kathy Skates, MD;  Location: [facility];  Service: General;  Laterality: Left;   COLONOSCOPY WITH PROPOFOL N/A 11/06/2015   Procedure: COLONOSCOPY WITH PROPOFOL;  Surgeon: Kathy Artist, MD;  [facility];  Service: Endoscopy;  Laterality: N/A;   ESOPHAGOGASTRODUODENOSCOPY (EGD) WITH PROPOFOL N/A 11/06/2015   Procedure: ESOPHAGOGASTRODUODENOSCOPY (EGD) WITH PROPOFOL;  Surgeon: Kathy Artist, MD;  [facility];  Service: Endoscopy;  Laterality: N/A;   EYE SURGERY      FAMILY HISTORY Family History  Problem Relation Age of Onset   Aneurysm Mother        brain   Heart disease Father    Heart disease Brother    AAA (abdominal aortic aneurysm) Maternal Uncle    Colon cancer Paternal Uncle        dx >50   Breast cancer Cousin        pat cousin, dx in her late 80s  Her mother had a brain aneurysm at age 42 that she passed from. Her father lived with cardiac issues and passed at age 22. She has 2 brothers and 3 sisters. She had one paternal cousin that had breast cancer at age 64. One uncle had a prior hx of colon cancer. She denies any family hx of ovarian cancer.    GYNECOLOGIC HISTORY:  No LMP recorded. Patient is postmenopausal. Menarche: 86 years old Age at first live birth: 86 years old Central P: [id number] LMP: 86 years old Contraceptive:No HRT: No    SOCIAL HISTORY: She retired from [employer], [employer], [employer], and [employer] at age 32. She will have been married 68 years in August, 2019. Her husband's name is Kathy Thornton and he is a Horticulturist, commercial. She lives at home with her husband. She has no pets, but reports that she keeps her granddaughter when the girl is not in school. She has 36 grandchildren and great grandchildren combined. She is a Psychologist, forensic.     ADVANCED DIRECTIVES: She is comfortable with her husband being her healthcare power of attorney   HEALTH MAINTENANCE: Social History   Tobacco Use   Smoking status: Never Smoker   Smokeless tobacco:  Never Used  Substance Use Topics   Alcohol use: No   Drug use: No     Colonoscopy: 11/05/2015/[facility]  PAP: Last one >10 years ago per patient   Bone density: 10/30/2017 showed a T score of -2.5   Allergies  Allergen Reactions   Sulfa Antibiotics Hives    Current Outpatient Medications  Medication Sig Dispense Refill   anastrozole (ARIMIDEX) 1 MG tablet Take 1 tablet (1 mg total) by mouth daily. 90 tablet 4   Cinnamon 500 MG capsule Take 1 capsule (500 mg total) by mouth daily.     HYDROcodone-acetaminophen (NORCO/VICODIN) 5-325 MG tablet Take 1 tablet by mouth every 6 (six) hours as needed. 8 tablet 0   pantoprazole (PROTONIX) 40 MG tablet TAKE 1 TABLET(40 MG) BY MOUTH DAILY 90 tablet 1   traMADol (ULTRAM) 50 MG tablet TAKE 1 TABLET BY MOUTH EVERY 8 HOURS AS NEEDED FOR MODERATE PAIN 30 tablet 3   Turmeric 500 MG CAPS Take 1 capsule by mouth 1 day or 1 dose.     Vitamin D, Ergocalciferol, 50 MCG (2000 UT) CAPS Take by  mouth.     No current facility-administered medications for this visit.    OBJECTIVE: white woman in no acute distress  Vitals:   05/06/20 1501  BP: (!) 166/65  Pulse: 65  Resp: 18  Temp: 98.2 F (36.8 C)  SpO2: 100%     Body mass index is 28.04 kg/m.   Wt Readings from Last 3 Encounters:  05/06/20 143 lb 9.6 oz (65.1 kg)  02/12/20 145 lb (65.8 kg)  11/11/19 144 lb (65.3 kg)      ECOG FS:2 - Symptomatic, <50% confined to bed  Sclerae unicteric, EOMs intact Wearing a mask No cervical or supraclavicular adenopathy Lungs no rales or rhonchi Heart regular rate and rhythm Abd soft, nontender, positive bowel sounds MSK no focal spinal tenderness, walks with 2 canes Neuro: nonfocal, well oriented, appropriate affect Breasts: The right breast is unremarkable.  The left breast is status post lumpectomy.  There is no evidence of local recurrence.  The cosmetic result is good.  Both axillae are benign.   LAB RESULTS:  CMP     Component Value  Date/Time   NA 137 02/12/2020 1358   K 4.4 02/12/2020 1358   CL 103 02/12/2020 1358   CO2 28 02/12/2020 1358   GLUCOSE 86 02/12/2020 1358   BUN 15 02/12/2020 1358   CREATININE 1.00 02/12/2020 1358   CREATININE 1.03 03/14/2018 1441   CREATININE 1.11 (H) 01/19/2016 1126   CALCIUM 9.7 02/12/2020 1358   PROT 6.5 02/12/2020 1358   ALBUMIN 4.0 02/12/2020 1358   AST 11 02/12/2020 1358   AST 13 03/14/2018 1441   ALT 8 02/12/2020 1358   ALT 10 03/14/2018 1441   ALKPHOS 88 02/12/2020 1358   BILITOT 0.5 02/12/2020 1358   BILITOT 0.4 03/14/2018 1441   GFRNONAA 39 (L) 10/13/2019 1434   GFRNONAA 49 (L) 03/14/2018 1441   GFRAA 45 (L) 10/13/2019 1434   GFRAA 57 (L) 03/14/2018 1441    No results found for: TOTALPROTELP, ALBUMINELP, A1GS, A2GS, BETS, BETA2SER, GAMS, MSPIKE, SPEI  No results found for: KPAFRELGTCHN, LAMBDASER, KAPLAMBRATIO  Lab Results  Component Value Date   WBC 7.1 02/12/2020   NEUTROABS 5.0 10/13/2019   HGB 12.7 02/12/2020   HCT 37.6 02/12/2020   MCV 92.4 02/12/2020   PLT 197.0 02/12/2020   No results found for: LABCA2  No components found for: TWSFKC127  No results for input(s): INR in the last 168 hours.  No results found for: LABCA2  No results found for: NTZ001  No results found for: VCB449  No results found for: QPR916  No results found for: CA2729  No components found for: HGQUANT  No results found for: CEA1 / No results found for: CEA1   No results found for: AFPTUMOR  No results found for: CHROMOGRNA  No results found for: HGBA, HGBA2QUANT, HGBFQUANT, HGBSQUAN (Hemoglobinopathy evaluation)   No results found for: LDH  Lab Results  Component Value Date   IRON 14 (L) 11/05/2015   TIBC 545 (H) 11/05/2015   IRONPCTSAT 3 (L) 11/05/2015   (Iron and TIBC)  Lab Results  Component Value Date   FERRITIN 3 (L) 11/05/2015    Urinalysis    Component Value Date/Time   COLORURINE YELLOW 10/13/2019 [place] 10/13/2019 1556    LABSPEC 1.015 10/13/2019 1556   PHURINE 5.0 10/13/2019 1556   GLUCOSEU NEGATIVE 10/13/2019 1556   HGBUR SMALL (A) 10/13/2019 1556   BILIRUBINUR NEGATIVE 10/13/2019 1556   BILIRUBINUR negative 11/04/2015 1418  KETONESUR 20 (A) 10/13/2019 1556   PROTEINUR NEGATIVE 10/13/2019 1556   UROBILINOGEN 0.2 11/04/2015 1418   NITRITE NEGATIVE 10/13/2019 1556   LEUKOCYTESUR SMALL (A) 10/13/2019 1556    STUDIES: No results found.   ELIGIBLE FOR AVAILABLE RESEARCH PROTOCOL:no  ASSESSMENT: 86 y.o.  woman status post left breast upper inner quadrant biopsy 12/22/2017 for a clinical T1b N0, stage IA invasive ductal carcinoma, grade 1, estrogen and progesterone receptor positive, HER-2 not amplified, with an MIB-1 of 3%.  (1) genetics testing 03/23/2018 through the hereditary Gene Panel offered by [facility] found no deleterious mutations in APC, ATM, AXIN2, BARD1, BMPR1A, BRCA1, BRCA2, BRIP1, CDH1, CDK4, CDKN2A (p14ARF), CDKN2A (p16INK4a), CHEK2, CTNNA1, DICER1, EPCAM (Deletion/duplication testing only), GREM1 (promoter region deletion/duplication testing only), KIT, MEN1, MLH1, MSH2, MSH3, MSH6, MUTYH, NBN, NF1, NHTL1, PALB2, PDGFRA, PMS2, POLD1, POLE, PTEN, RAD50, RAD51C, RAD51D, SDHB, SDHC, SDHD, SMAD4, SMARCA4. STK11, TP53, TSC1, TSC2, and VHL.  The following genes were evaluated for sequence changes only: SDHA and HOXB13 c.251G>A variant only.   (a) APC c.7468G>A (p.Asp2490Asn) VUS identified   (2) patient initially refused surgery or radiation, but underwent left lumpectomy 03/30/2018 for a pT1b cN0, stage IA invasive ductal carcinoma, grade 1, with negative margins  (3) anastrozole started 01/02/2018  (a) bone density 10/30/2017 shows a T score of -2.5  (b) alendronate prescribed JAN 2019 but never started by patient  (c) anastrozole discontinued April 2021 with multiple side effects   PLAN: Kathy Thornton took anastrozole a little over a year, but has developed a variety of symptoms which  could well be related to the anastrozole and which certainly did improve within 2 months of stopping the medication.  There are other aromatase inhibitors we could try or we could give tamoxifen to try but given the very small size of her tumor and her age, I am not uncomfortable with observation alone.  This is especially true because her breasts are not dense, and therefore mammography should be informative.  She already has an appointment with Dr. Edilia Bo later this month.  She has mammography at Dr. Arlyn Thornton in September.  I am going to see her in September after that mammogram and from that point I will see her on a yearly basis  I have encouraged her to call us with any other issues that may develop that may be related to her breast cancer history  Total encounter time 25 minutes.*   Bee Marchiano, Kathy Dad, MD  05/06/20 3:52 PM Medical Oncology and Hematology [facility] [place], [place] [address] Tel. [phone]    Fax. [phone]    I, Wilburn Mylar, am acting as scribe for Dr. Virgie Thornton. Kathy Thornton.  I, Lurline Del MD, have reviewed the above documentation for accuracy and completeness, and I agree with the above.   *Total Encounter Time as defined by the Centers for Medicare and Medicaid Services includes, in addition to the face-to-face time of a patient visit (documented in the note above) non-face-to-face time: obtaining and reviewing outside history, ordering and reviewing medications, tests or procedures, care coordination (communications with other health care professionals or caregivers) and documentation in the medical record.

## 2020-05-07 ENCOUNTER — Telehealth: Payer: Self-pay | Admitting: Oncology

## 2020-05-07 NOTE — Telephone Encounter (Signed)
No 6/2 los. No changes made to pt's schedule.

## 2020-05-11 NOTE — Progress Notes (Signed)
[facility] [address], [place], [place] [address] [phone]  Date:  05/13/2020   Name:  Kathy Thornton   DOB:  [birth year]   MRN:  [id number]  PCP:  Darreld Mclean, MD    Chief Complaint: Arthritis and Follow-up   History of Present Illness:  Kathy Thornton is a 86 y.o. very pleasant female patient who presents with the following:  Patient here today for 14-month follow-up visit History of breast cancer 2018, chronic arthritis pain, osteoporosis She had an ulcer in 2018 or 2019 due to NSAID use, we now use tramadol for her knee pain Last seen by myself in March-that time she was having from lightheadedness and presyncope. She had stopped her antiestrogen therapy due to possible side effects She saw Dr. Jana Hakim with oncology last week-for the time being they are continue to hold her anastrozole and observing her cancer  [name] notes she is feeling much better since she stopped her anastrozole.  Her memory and alertness/ balance are all better, and her energy level is improved   She is walking for exercise- she has started walking for 40- 60 minutes most days and is tolerating this well   Covid series- encouraged her to get this done and she does plan to do so    Patient Active Problem List   Diagnosis Date Noted  . Genetic testing 03/27/2018  . Family history of colon cancer   . Family history of breast cancer   . Malignant neoplasm of upper-inner quadrant of left breast in female, estrogen receptor positive ([place]) 01/02/2018  . Osteoporosis 01/02/2018  . [name] lesion, acute: Per EGD 11/06/2015 11/06/2015  . Erosive gastritis: per EGD 11/05/2105 11/06/2015  . Hemorrhoids; Grade 1 per colonoscopy 11/06/2015 11/06/2015  . Anemia, iron deficiency   . Acute gastric ulcer   . Anemia 11/05/2015  . Symptomatic anemia 11/05/2015  . Heart murmur 11/05/2015  . Lower urinary tract infectious disease 11/05/2015  .  Lower extremity edema 11/05/2015  . Arthritis of both knees 11/04/2015    Past Medical History:  Diagnosis Date  . Breast cancer ([place]) 2019   Left Breast Cancer  . Breast cancer in female [facility])    Left  . [name] lesion, acute: Per EGD 11/06/2015 11/06/2015  . Cataract   . Erosive gastritis: per EGD 11/05/2105 11/06/2015  . Family history of breast cancer   . Family history of colon cancer   . Heart murmur   . Hemorrhoids; Grade 1 per colonoscopy 11/06/2015 11/06/2015  . Knee pain     Past Surgical History:  Procedure Laterality Date  . BREAST LUMPECTOMY Left 03/30/2018  . BREAST LUMPECTOMY WITH RADIOACTIVE SEED LOCALIZATION Left 03/30/2018   Procedure: RADIOACTIVE SEED GUIDED LEFT BREAST LUMPECTOMY;  Surgeon: Fanny Skates, MD;  Location: [facility];  Service: General;  Laterality: Left;  . COLONOSCOPY WITH PROPOFOL N/A 11/06/2015   Procedure: COLONOSCOPY WITH PROPOFOL;  Surgeon: Ladene Artist, MD;  [facility];  Service: Endoscopy;  Laterality: N/A;  . ESOPHAGOGASTRODUODENOSCOPY (EGD) WITH PROPOFOL N/A 11/06/2015   Procedure: ESOPHAGOGASTRODUODENOSCOPY (EGD) WITH PROPOFOL;  Surgeon: Ladene Artist, MD;  [facility];  Service: Endoscopy;  Laterality: N/A;  . EYE SURGERY      Social History   Tobacco Use  . Smoking status: Never Smoker  . Smokeless tobacco: Never Used  Substance Use Topics  . Alcohol use: No  . Drug use: No  Family History  Problem Relation Age of Onset  . Aneurysm Mother        brain  . Heart disease Father   . Heart disease Brother   . AAA (abdominal aortic aneurysm) Maternal Uncle   . Colon cancer Paternal Uncle        dx >50  . Breast cancer Cousin        pat cousin, dx in her late 52s    Allergies  Allergen Reactions  . Sulfa Antibiotics Hives    Medication list has been reviewed and updated.  Current Outpatient Medications on File Prior to Visit  Medication Sig Dispense Refill  . Cinnamon 500 MG capsule Take 1  capsule (500 mg total) by mouth daily.    . pantoprazole (PROTONIX) 40 MG tablet TAKE 1 TABLET(40 MG) BY MOUTH DAILY 90 tablet 1  . traMADol (ULTRAM) 50 MG tablet TAKE 1 TABLET BY MOUTH EVERY 8 HOURS AS NEEDED FOR MODERATE PAIN 30 tablet 3  . Turmeric 500 MG CAPS Take 1 capsule by mouth 1 day or 1 dose.    . Vitamin D, Ergocalciferol, 50 MCG (2000 UT) CAPS Take by mouth.    Marland Kitchen anastrozole (ARIMIDEX) 1 MG tablet Take 1 tablet (1 mg total) by mouth daily. (Patient not taking: Reported on 05/13/2020) 90 tablet 4  . HYDROcodone-acetaminophen (NORCO/VICODIN) 5-325 MG tablet Take 1 tablet by mouth every 6 (six) hours as needed. (Patient not taking: Reported on 05/13/2020) 8 tablet 0   No current facility-administered medications on file prior to visit.    Review of Systems:  As per HPI- otherwise negative.   Physical Examination: Vitals:   05/13/20 1301  BP: (!) 142/82  Pulse: 73  Resp: 18  Temp: (!) 97.4 F (36.3 C)  SpO2: 97%   Vitals:   05/13/20 1301  Weight: 142 lb 12.8 oz (64.8 kg)  Height: 5' (1.524 m)   Body mass index is 27.89 kg/m. Ideal Body Weight: Weight in (lb) to have BMI = 25: 127.7  GEN: no acute distress.  Normal weight for age, looks well Accompanied by her daughter [name] HEENT: Atraumatic, Normocephalic.  Ears and Nose: No external deformity. CV: RRR, No M/G/R. No JVD. No thrill. No extra heart sounds. PULM: CTA B, no wheezes, crackles, rhonchi. No retractions. No resp. distress. No accessory muscle use. ABD: S, NT, ND. No rebound. No HSM. EXTR: No c/c/e PSYCH: Normally interactive. Conversant.    Assessment and Plan: History of gastric ulcer  Arthritis of both knees  Primary osteoarthritis of both knees - Plan: traMADol (ULTRAM) 50 MG tablet  Here today for a follow-up visit Refilled her tramadol that she uses for knee pain- we avoid NSAIDs given history of ulcer Updated sig to 90 days for pt convenience  Encouraged her to get covid vaccine Labs not  needed today Follow-up in 6 months This visit occurred during the SARS-CoV-2 public health emergency.  Safety protocols were in place, including screening questions prior to the visit, additional usage of staff PPE, and extensive cleaning of exam room while observing appropriate contact time as indicated for disinfecting solutions.    Signed Lamar Blinks, MD

## 2020-05-13 ENCOUNTER — Other Ambulatory Visit: Payer: Self-pay | Admitting: Oncology

## 2020-05-13 ENCOUNTER — Encounter: Payer: Self-pay | Admitting: Family Medicine

## 2020-05-13 ENCOUNTER — Ambulatory Visit (INDEPENDENT_AMBULATORY_CARE_PROVIDER_SITE_OTHER): Payer: Medicare Other | Admitting: Family Medicine

## 2020-05-13 ENCOUNTER — Other Ambulatory Visit: Payer: Self-pay

## 2020-05-13 VITALS — BP 142/82 | HR 73 | Temp 97.4°F | Resp 18 | Ht 60.0 in | Wt 142.8 lb

## 2020-05-13 DIAGNOSIS — Z853 Personal history of malignant neoplasm of breast: Secondary | ICD-10-CM

## 2020-05-13 DIAGNOSIS — Z8711 Personal history of peptic ulcer disease: Secondary | ICD-10-CM

## 2020-05-13 DIAGNOSIS — Z9889 Other specified postprocedural states: Secondary | ICD-10-CM

## 2020-05-13 DIAGNOSIS — M170 Bilateral primary osteoarthritis of knee: Secondary | ICD-10-CM

## 2020-05-13 DIAGNOSIS — Z8719 Personal history of other diseases of the digestive system: Secondary | ICD-10-CM | POA: Diagnosis not present

## 2020-05-13 MED ORDER — TRAMADOL HCL 50 MG PO TABS: ORAL_TABLET | ORAL | 1 refills | Status: DC

## 2020-05-13 NOTE — Patient Instructions (Addendum)
Can do mammogram at the [facility] Address: [address], [place], [place] [address] Phone: [phone]  Please get your covid 19 series asap  Please think about getting the shingles vaccine at your convenience- this can be given at your pharmacy   Please see me in about 6 months and take care!

## 2020-08-19 ENCOUNTER — Ambulatory Visit
Admission: RE | Admit: 2020-08-19 | Discharge: 2020-08-19 | Disposition: A | Discharge status: Home / Self Care | Payer: Medicare Other | Source: Ambulatory Visit | Attending: Oncology | Admitting: Oncology

## 2020-08-19 ENCOUNTER — Other Ambulatory Visit: Payer: Self-pay

## 2020-08-19 DIAGNOSIS — Z9889 Other specified postprocedural states: Secondary | ICD-10-CM

## 2020-08-19 DIAGNOSIS — Z853 Personal history of malignant neoplasm of breast: Secondary | ICD-10-CM

## 2020-08-19 DIAGNOSIS — R928 Other abnormal and inconclusive findings on diagnostic imaging of breast: Secondary | ICD-10-CM | POA: Diagnosis not present

## 2020-08-27 DIAGNOSIS — M170 Bilateral primary osteoarthritis of knee: Secondary | ICD-10-CM | POA: Diagnosis not present

## 2020-08-31 ENCOUNTER — Ambulatory Visit: Payer: Medicare Other | Admitting: Oncology

## 2020-08-31 ENCOUNTER — Other Ambulatory Visit: Payer: Medicare Other

## 2020-09-02 ENCOUNTER — Other Ambulatory Visit: Payer: Self-pay

## 2020-09-02 ENCOUNTER — Inpatient Hospital Stay (HOSPITAL_BASED_OUTPATIENT_CLINIC_OR_DEPARTMENT_OTHER): Payer: Medicare Other | Admitting: Oncology

## 2020-09-02 ENCOUNTER — Inpatient Hospital Stay: Payer: Medicare Other | Attending: Oncology

## 2020-09-02 ENCOUNTER — Telehealth: Payer: Self-pay | Admitting: Oncology

## 2020-09-02 VITALS — BP 134/81 | HR 100 | Temp 97.6°F | Resp 16 | Ht 60.0 in | Wt 143.3 lb

## 2020-09-02 DIAGNOSIS — Z23 Encounter for immunization: Secondary | ICD-10-CM | POA: Diagnosis not present

## 2020-09-02 DIAGNOSIS — Z170 Estrogen receptor positive status [ER+]: Secondary | ICD-10-CM | POA: Insufficient documentation

## 2020-09-02 DIAGNOSIS — Z800 Family history of malignant neoplasm of digestive organs: Secondary | ICD-10-CM | POA: Insufficient documentation

## 2020-09-02 DIAGNOSIS — I2510 Atherosclerotic heart disease of native coronary artery without angina pectoris: Secondary | ICD-10-CM | POA: Diagnosis not present

## 2020-09-02 DIAGNOSIS — Z8249 Family history of ischemic heart disease and other diseases of the circulatory system: Secondary | ICD-10-CM | POA: Diagnosis not present

## 2020-09-02 DIAGNOSIS — C50212 Malignant neoplasm of upper-inner quadrant of left female breast: Secondary | ICD-10-CM

## 2020-09-02 DIAGNOSIS — Z79811 Long term (current) use of aromatase inhibitors: Secondary | ICD-10-CM | POA: Insufficient documentation

## 2020-09-02 DIAGNOSIS — Z882 Allergy status to sulfonamides status: Secondary | ICD-10-CM | POA: Diagnosis not present

## 2020-09-02 DIAGNOSIS — Z79899 Other long term (current) drug therapy: Secondary | ICD-10-CM | POA: Diagnosis not present

## 2020-09-02 DIAGNOSIS — Z803 Family history of malignant neoplasm of breast: Secondary | ICD-10-CM | POA: Diagnosis not present

## 2020-09-02 LAB — COMPREHENSIVE METABOLIC PANEL
ALT: 13 U/L (ref 0–44)
AST: 14 U/L — ABNORMAL LOW (ref 15–41)
Albumin: 4 g/dL (ref 3.5–5.0)
Alkaline Phosphatase: 98 U/L (ref 38–126)
Anion gap: 9 (ref 5–15)
BUN: 16 mg/dL (ref 8–23)
CO2: 27 mmol/L (ref 22–32)
Calcium: 9.6 mg/dL (ref 8.9–10.3)
Chloride: 105 mmol/L (ref 98–111)
Creatinine, Ser: 1.23 mg/dL — ABNORMAL HIGH (ref 0.44–1.00)
GFR calc Af Amer: 47 mL/min — ABNORMAL LOW (ref >60)
GFR calc non Af Amer: 40 mL/min — ABNORMAL LOW (ref >60)
Glucose, Bld: 104 mg/dL — ABNORMAL HIGH (ref 70–99)
Potassium: 4.8 mmol/L (ref 3.5–5.1)
Sodium: 141 mmol/L (ref 135–145)
Total Bilirubin: 0.5 mg/dL (ref 0.3–1.2)
Total Protein: 7.2 g/dL (ref 6.5–8.1)

## 2020-09-02 LAB — CBC WITH DIFFERENTIAL/PLATELET
Abs Immature Granulocytes: 0.03 10*3/uL (ref 0.00–0.07)
Basophils Absolute: 0.1 10*3/uL (ref 0.0–0.1)
Basophils Relative: 1 %
Eosinophils Absolute: 0.1 10*3/uL (ref 0.0–0.5)
Eosinophils Relative: 1 %
HCT: 41.4 % (ref 36.0–46.0)
Hemoglobin: 13.3 g/dL (ref 12.0–15.0)
Immature Granulocytes: 0 %
Lymphocytes Relative: 29 %
Lymphs Abs: 2.8 10*3/uL (ref 0.7–4.0)
MCH: 29.6 pg (ref 26.0–34.0)
MCHC: 32.1 g/dL (ref 30.0–36.0)
MCV: 92.2 fL (ref 80.0–100.0)
Monocytes Absolute: 0.8 10*3/uL (ref 0.1–1.0)
Monocytes Relative: 8 %
Neutro Abs: 5.8 10*3/uL (ref 1.7–7.7)
Neutrophils Relative %: 61 %
Platelets: 229 10*3/uL (ref 150–400)
RBC: 4.49 MIL/uL (ref 3.87–5.11)
RDW: 13.5 % (ref 11.5–15.5)
WBC: 9.6 10*3/uL (ref 4.0–10.5)
nRBC: 0 % (ref 0.0–0.2)

## 2020-09-02 MED ORDER — INFLUENZA VAC A&B SA ADJ QUAD 0.5 ML IM PRSY
0.5000 mL | PREFILLED_SYRINGE | Freq: Once | INTRAMUSCULAR | Status: AC
  Administered 2020-09-02: 0.5 mL via INTRAMUSCULAR

## 2020-09-02 MED ORDER — INFLUENZA VAC A&B SA ADJ QUAD 0.5 ML IM PRSY
PREFILLED_SYRINGE | INTRAMUSCULAR | Status: AC
  Filled 2020-09-02: qty 0.5

## 2020-09-02 NOTE — Telephone Encounter (Signed)
Scheduled appointments per 9/29 los. Gave patient updated calendar with appointments date and times.

## 2020-09-02 NOTE — Progress Notes (Signed)
[place]  [phone] [phone]     ID: Kathy Thornton DOB: January 09, 1934  MR#: [id number]  XKP#:[id number]  Patient Care Team: Darreld Mclean, MD as PCP - General (Family Medicine) Earlie Server, MD as Consulting Physician (Orthopedic Surgery) Gatha Mayer, MD as Consulting Physician (Gastroenterology) Tad Fancher, Virgie Dad, MD as Consulting Physician (Oncology) Fanny Skates, MD as Consulting Physician (General Surgery) OTHER MD:  CHIEF COMPLAINT: Estrogen receptor positive breast cancer  CURRENT TREATMENT: Observation   INTERVAL HISTORY: Kathy Thornton returns today for follow-up of her estrogen receptor positive breast cancer accompanied by her daughter. She is now under observation after not tolerating antiestrogens.  Since her last visit, she underwent bilateral diagnostic mammography with tomography at [facility] on 08/19/2020 showing: breast density category B; no evidence of malignancy in either breast.    REVIEW OF SYSTEMS: Kathy Thornton is doing "great".  She has had both doses of the Pfizer vaccine which she tolerated well.  She also gets shots for her knees and that is helping.  She uses 2 canes to walk.  There have been no intercurrent falls.  She does cooking Development worker, international aid and some yard work.  A detailed review of systems today was otherwise stable.   HISTORY OF CURRENT ILLNESS:  From the original intake note:  Kathy Thornton had routine screening mammography at Dr. Arlyn Dunning on 10/30/2017 showing a possible abnormality in the left breast. She underwent bilateral diagnostic mammography with tomography and left breast ultrasonography at the [facility] on 12/19/2017 showing:  a highly suspicious mass in the left breast at 11 o'clock. No evidence of left axillary lymphadenopathy. Ultrasound targeted to the left breast at 11 o'clock, 5 cm from the nipple demonstrates an ill-defined irregular vascular mass with spiculated margins  measuring 8 x 6 x 6 mm. Ultrasound of the left axilla demonstrates multiple normal-appearing lymph nodes.  Accordingly on 12/22/2017 she proceeded to biopsy of the left breast area in question. 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 Prognostic indicators significant for: ER, 90% positive and PR, 80% positive, both with strong staining intensity. Proliferation marker Ki67 at 3%. HER2 negative via [place].  The patient's subsequent history is as detailed below.   PAST MEDICAL HISTORY: Past Medical History:  Diagnosis Date  . Breast cancer ([place]) 2019   Left Breast Cancer  . Breast cancer in female [facility])    Left  . [name] lesion, acute: Per EGD 11/06/2015 11/06/2015  . Cataract   . Erosive gastritis: per EGD 11/05/2105 11/06/2015  . Family history of breast cancer   . Family history of colon cancer   . Heart murmur   . Hemorrhoids; Grade 1 per colonoscopy 11/06/2015 11/06/2015  . Knee pain     PAST SURGICAL HISTORY: Past Surgical History:  Procedure Laterality Date  . BREAST LUMPECTOMY Left 03/30/2018  . BREAST LUMPECTOMY WITH RADIOACTIVE SEED LOCALIZATION Left 03/30/2018   Procedure: RADIOACTIVE SEED GUIDED LEFT BREAST LUMPECTOMY;  Surgeon: Fanny Skates, MD;  Location: [facility];  Service: General;  Laterality: Left;  . COLONOSCOPY WITH PROPOFOL N/A 11/06/2015   Procedure: COLONOSCOPY WITH PROPOFOL;  Surgeon: Ladene Artist, MD;  [facility];  Service: Endoscopy;  Laterality: N/A;  . ESOPHAGOGASTRODUODENOSCOPY (EGD) WITH PROPOFOL N/A 11/06/2015   Procedure: ESOPHAGOGASTRODUODENOSCOPY (EGD) WITH PROPOFOL;  Surgeon: Ladene Artist, MD;  [facility];  Service: Endoscopy;  Laterality: N/A;  .  EYE SURGERY      FAMILY HISTORY Family History  Problem Relation Age of Onset  . Aneurysm Mother        brain  .  Heart disease Father   . Heart disease Brother   . AAA (abdominal aortic aneurysm) Maternal Uncle   . Colon cancer Paternal Uncle        dx >50  . Breast cancer Cousin        pat cousin, dx in her late 69s  Her mother had a brain aneurysm at age 10 that she passed from. Her father lived with cardiac issues and passed at age 34. She has 2 brothers and 3 sisters. She had one paternal cousin that had breast cancer at age 6. One uncle had a prior hx of colon cancer. She denies any family hx of ovarian cancer.    GYNECOLOGIC HISTORY:  No LMP recorded. Patient is postmenopausal. Menarche: 86 years old Age at first live birth: 86 years old [place] P: [id number] LMP: 86 years old Contraceptive:No HRT: No    SOCIAL HISTORY: She retired from [employer], [employer], [employer], and [employer] at age 89. She will have been married 9 years in August, 2019. Her husband's name is Elenore Rota and he is a Horticulturist, commercial. She lives at home with her husband. She has no pets, but reports that she keeps her granddaughter when the girl is not in school. She has 36 grandchildren and great grandchildren combined. She is a Psychologist, forensic.     ADVANCED DIRECTIVES: She is comfortable with her husband being her healthcare power of attorney   HEALTH MAINTENANCE: Social History   Tobacco Use  . Smoking status: Never Smoker  . Smokeless tobacco: Never Used  Vaping Use  . Vaping Use: Never used  Substance Use Topics  . Alcohol use: No  . Drug use: No     Colonoscopy: 11/05/2015/[facility]  PAP: Last one >10 years ago per patient   Bone density: 10/30/2017 showed a T score of -2.5   Allergies  Allergen Reactions  . Sulfa Antibiotics Hives    Current Outpatient Medications  Medication Sig Dispense Refill  . anastrozole (ARIMIDEX) 1 MG tablet Take 1 tablet (1 mg total) by mouth daily. (Patient not taking: Reported on 05/13/2020) 90 tablet 4  . Cinnamon 500 MG capsule Take 1 capsule (500 mg total) by mouth daily.    Marland Kitchen  HYDROcodone-acetaminophen (NORCO/VICODIN) 5-325 MG tablet Take 1 tablet by mouth every 6 (six) hours as needed. (Patient not taking: Reported on 05/13/2020) 8 tablet 0  . pantoprazole (PROTONIX) 40 MG tablet TAKE 1 TABLET(40 MG) BY MOUTH DAILY 90 tablet 1  . traMADol (ULTRAM) 50 MG tablet TAKE 1 TABLET BY MOUTH EVERY 8 HOURS AS NEEDED FOR MODERATE PAIN 90 tablet 1  . Turmeric 500 MG CAPS Take 1 capsule by mouth 1 day or 1 dose.    . Vitamin D, Ergocalciferol, 50 MCG (2000 UT) CAPS Take by mouth.     No current facility-administered medications for this visit.    OBJECTIVE: white woman who appears younger than stated age  61:   09/02/20 1334  BP: 134/81  Pulse: 100  Resp: 16  Temp: 97.6 F (36.4 C)  SpO2: 97%     Body mass index is 27.99 kg/m.   Wt Readings from Last 3 Encounters:  09/02/20 143 lb 4.8 oz (65 kg)  05/13/20 142 lb 12.8 oz (64.8 kg)  05/06/20 143 lb 9.6 oz (65.1 kg)  ECOG FS:2 - Symptomatic, <50% confined to bed  Sclerae unicteric, EOMs intact Wearing a mask No cervical or supraclavicular adenopathy Lungs no rales or rhonchi Heart regular rate and rhythm Abd soft, nontender, positive bowel sounds MSK no focal spinal tenderness, no upper extremity lymphedema Neuro: nonfocal, well oriented, appropriate affect Breasts: Right breast is benign.  The left breast has undergone lumpectomy.  There is no evidence of disease recurrence.  Both axillae are benign.   LAB RESULTS:  CMP     Component Value Date/Time   NA 137 02/12/2020 1358   K 4.4 02/12/2020 1358   CL 103 02/12/2020 1358   CO2 28 02/12/2020 1358   GLUCOSE 86 02/12/2020 1358   BUN 15 02/12/2020 1358   CREATININE 1.00 02/12/2020 1358   CREATININE 1.03 03/14/2018 1441   CREATININE 1.11 (H) 01/19/2016 1126   CALCIUM 9.7 02/12/2020 1358   PROT 6.5 02/12/2020 1358   ALBUMIN 4.0 02/12/2020 1358   AST 11 02/12/2020 1358   AST 13 03/14/2018 1441   ALT 8 02/12/2020 1358   ALT 10 03/14/2018 1441     ALKPHOS 88 02/12/2020 1358   BILITOT 0.5 02/12/2020 1358   BILITOT 0.4 03/14/2018 1441   GFRNONAA 39 (L) 10/13/2019 1434   GFRNONAA 49 (L) 03/14/2018 1441   GFRAA 45 (L) 10/13/2019 1434   GFRAA 57 (L) 03/14/2018 1441    No results found for: TOTALPROTELP, ALBUMINELP, A1GS, A2GS, BETS, BETA2SER, GAMS, MSPIKE, SPEI  No results found for: KPAFRELGTCHN, LAMBDASER, KAPLAMBRATIO  Lab Results  Component Value Date   WBC 9.6 09/02/2020   NEUTROABS 5.8 09/02/2020   HGB 13.3 09/02/2020   HCT 41.4 09/02/2020   MCV 92.2 09/02/2020   PLT 229 09/02/2020   No results found for: LABCA2  No components found for: [id number]  No results for input(s): INR in the last 168 hours.  No results found for: LABCA2  No results found for: TDD220  No results found for: URK270  No results found for: WCB762  No results found for: CA2729  No components found for: HGQUANT  No results found for: CEA1 / No results found for: CEA1   No results found for: AFPTUMOR  No results found for: CHROMOGRNA  No results found for: HGBA, HGBA2QUANT, HGBFQUANT, HGBSQUAN (Hemoglobinopathy evaluation)   No results found for: LDH  Lab Results  Component Value Date   IRON 14 (L) 11/05/2015   TIBC 545 (H) 11/05/2015   IRONPCTSAT 3 (L) 11/05/2015   (Iron and TIBC)  Lab Results  Component Value Date   FERRITIN 3 (L) 11/05/2015    Urinalysis    Component Value Date/Time   COLORURINE YELLOW 10/13/2019 [place] 10/13/2019 1556   LABSPEC 1.015 10/13/2019 1556   PHURINE 5.0 10/13/2019 1556   GLUCOSEU NEGATIVE 10/13/2019 1556   HGBUR SMALL (A) 10/13/2019 1556   BILIRUBINUR NEGATIVE 10/13/2019 1556   BILIRUBINUR negative 11/04/2015 1418   KETONESUR 20 (A) 10/13/2019 1556   PROTEINUR NEGATIVE 10/13/2019 1556   UROBILINOGEN 0.2 11/04/2015 1418   NITRITE NEGATIVE 10/13/2019 1556   LEUKOCYTESUR SMALL (A) 10/13/2019 1556    STUDIES: MM DIAG BREAST TOMO BILATERAL  Result Date:  08/19/2020 CLINICAL DATA:  History of left breast cancer status post lumpectomy in 2019. EXAM: DIGITAL DIAGNOSTIC BILATERAL MAMMOGRAM WITH TOMO AND CAD COMPARISON:  Previous exam(s). ACR Breast Density Category b: There are scattered areas of fibroglandular density. FINDINGS: Stable lumpectomy changes are seen in the left breast. No suspicious mass or malignant  type microcalcifications identified in either breast. Mammographic images were processed with CAD. IMPRESSION: No evidence of malignancy in either breast. RECOMMENDATION: Bilateral diagnostic mammogram in 1 year is recommended. I have discussed the findings and recommendations with the patient. If applicable, a reminder letter will be sent to the patient regarding the next appointment. BI-RADS CATEGORY  2: Benign. Electronically Signed   By: Lillia Mountain M.D.   On: 08/19/2020 13:58     ELIGIBLE FOR AVAILABLE RESEARCH PROTOCOL:no  ASSESSMENT: 86 y.o.  woman status post left breast upper inner quadrant biopsy 12/22/2017 for a clinical T1b N0, stage IA invasive ductal carcinoma, grade 1, estrogen and progesterone receptor positive, HER-2 not amplified, with an MIB-1 of 3%.  (1) genetics testing 03/23/2018 through the hereditary Gene Panel offered by [facility] found no deleterious mutations in APC, ATM, AXIN2, BARD1, BMPR1A, BRCA1, BRCA2, BRIP1, CDH1, CDK4, CDKN2A (p14ARF), CDKN2A (p16INK4a), CHEK2, CTNNA1, DICER1, EPCAM (Deletion/duplication testing only), GREM1 (promoter region deletion/duplication testing only), KIT, MEN1, MLH1, MSH2, MSH3, MSH6, MUTYH, NBN, NF1, NHTL1, PALB2, PDGFRA, PMS2, POLD1, POLE, PTEN, RAD50, RAD51C, RAD51D, SDHB, SDHC, SDHD, SMAD4, SMARCA4. STK11, TP53, TSC1, TSC2, and VHL.  The following genes were evaluated for sequence changes only: SDHA and HOXB13 c.251G>A variant only.   (a) APC c.7468G>A (p.Asp2490Asn) VUS identified   (2) patient initially refused surgery or radiation, but underwent left lumpectomy 03/30/2018  for a pT1b cN0, stage IA invasive ductal carcinoma, grade 1, with negative margins  (3) anastrozole started 01/02/2018  (a) bone density 10/30/2017 shows a T score of -2.5  (b) alendronate prescribed JAN 2019 but never started by patient  (c) anastrozole discontinued April 2021 with multiple side effects   PLAN: Kathy Thornton is now 2-1/2 years out from definitive surgery for her breast cancer with no evidence of disease recurrence.  This is very favorable.  She maintains an excellent functional status for her age.  Were going to go ahead and give her the flu shot today and she should receive the booster for the COVID-19 as appropriate.  We reviewed her mammogram which is favorable and she understands having not dense breasts is a good thing.  She will see me again in a year, after to next year's mammography.  They know to call for any other issue that may develop before then.  Total encounter time 25 minutes.*   Romani Wilbon, Virgie Dad, MD  09/02/20 1:51 PM Medical Oncology and Hematology [facility] [place], [place] [address] Tel. [phone]    Fax. [phone]    I, Wilburn Mylar, am acting as scribe for Dr. Virgie Dad. Shaughnessy Gethers.  I, Lurline Del MD, have reviewed the above documentation for accuracy and completeness, and I agree with the above.   *Total Encounter Time as defined by the Centers for Medicare and Medicaid Services includes, in addition to the face-to-face time of a patient visit (documented in the note above) non-face-to-face time: obtaining and reviewing outside history, ordering and reviewing medications, tests or procedures, care coordination (communications with other health care professionals or caregivers) and documentation in the medical record.

## 2020-09-05 ENCOUNTER — Other Ambulatory Visit: Payer: Self-pay | Admitting: Oncology

## 2020-09-05 ENCOUNTER — Other Ambulatory Visit: Payer: Self-pay | Admitting: Family Medicine

## 2020-09-05 DIAGNOSIS — Z8711 Personal history of peptic ulcer disease: Secondary | ICD-10-CM

## 2020-09-30 DIAGNOSIS — M1611 Unilateral primary osteoarthritis, right hip: Secondary | ICD-10-CM | POA: Diagnosis not present

## 2020-11-01 ENCOUNTER — Other Ambulatory Visit: Payer: Self-pay | Admitting: Family Medicine

## 2020-11-01 DIAGNOSIS — M170 Bilateral primary osteoarthritis of knee: Secondary | ICD-10-CM

## 2020-11-03 NOTE — Telephone Encounter (Signed)
Last written: 05/13/20 Last ov: 05/13/20 Next ov: 11/11/20 Contract: none UDS: none

## 2020-11-10 NOTE — Progress Notes (Addendum)
[facility] [place], [place], [place] [address] [phone]  Date:  11/11/2020   Name:  Kathy Thornton   DOB:  12-03-1934   MRN:  [id number]  PCP:  Darreld Mclean, MD    Chief Complaint: 6 month follow up   History of Present Illness:  Kathy Thornton is a 86 y.o. very pleasant female patient who presents with the following:  Here today for 79-month follow-up visit- History of breast cancer 2018, chronic arthritis pain, osteoporosis, chronic renal insufficiency She had an ulcer in 2018 or 2019 due to NSAID use, we now use tramadol for her knee pain Last seen by myself in [place] that time she was doing overall well, taking tramadol for knee pain She is seeing Dr French Ana for her knees  They are starting to think about surgery- she is not sure if this is something she wishes to pursue   Visit with her oncologist, Dr. Jana Hakim in September-current treatment is observation and routine mammogram COVID-19 series- series done Flu vaccine is done Shingrix- not done yet  DEXA scan up-to-date  Pantoprazole Tramadol for knee pain No longer needs to be on oral cancer meds  Not on medication for her bones at this time ?  Referral to nephrology- not done yet. Check on renal function today   11/03/2020  11/03/2020   1  Tramadol Hcl 50 Mg Tablet 90.00  30  Je Cop  [id number]  Wal ([id number])  0/0  15.00 MME  Medicare  [place]    07/30/2020  05/13/2020   1  Tramadol Hcl 50 Mg Tablet 90.00  30  Je Cop  [id number]  Wal ([id number])  1/1  15.00 MME  Medicare  [place]    05/19/2020  05/13/2020   1  Tramadol Hcl 50 Mg Tablet 90.00  30  Je Cop  [id number]  Wal ([id number])  0/1  15.00 MME  Medicare  [place]    04/17/2020  01/10/2020   1  Tramadol Hcl 50 Mg Tablet 30.00  10  Je Cop  [id number]  Wal ([id number])  3/3  15.00 MME  Medicare  [place]    03/18/2020  01/10/2020   2  Tramadol Hcl 50 Mg Tablet 30.00  10  Je Cop  [id number]  Wal ([id number])  2/3  15.00 MME  Medicare  [place]    02/21/2020   01/10/2020   1  Tramadol Hcl 50 Mg Tablet 30.00  10  Je Cop  [id number]  Wal ([id number])  1/3  15.00 MME  Medicare      01/10/2020  01/10/2020   1  Tramadol Hcl 50 Mg Tablet 30.00  10  Je Cop  [id number]          Patient Active Problem List   Diagnosis Date Noted  . Genetic testing 03/27/2018  . Family history of colon cancer   . Family history of breast cancer   . Malignant neoplasm of upper-inner quadrant of left breast in female, estrogen receptor positive ([place]) 01/02/2018  . Osteoporosis 01/02/2018  . [name] lesion, acute: Per EGD 11/06/2015 11/06/2015  . Erosive gastritis: per EGD 11/05/2105 11/06/2015  . Hemorrhoids; Grade 1 per colonoscopy 11/06/2015 11/06/2015  . Anemia, iron deficiency   . Acute gastric ulcer   . Anemia 11/05/2015  . Symptomatic anemia 11/05/2015  . Heart murmur 11/05/2015  . Lower urinary tract infectious disease 11/05/2015  . Lower extremity edema 11/05/2015  . Arthritis of  both knees 11/04/2015    Past Medical History:  Diagnosis Date  . Breast cancer ([place]) 2019   Left Breast Cancer  . Breast cancer in female [facility])    Left  . [name] lesion, acute: Per EGD 11/06/2015 11/06/2015  . Cataract   . Erosive gastritis: per EGD 11/05/2105 11/06/2015  . Family history of breast cancer   . Family history of colon cancer   . Heart murmur   . Hemorrhoids; Grade 1 per colonoscopy 11/06/2015 11/06/2015  . Knee pain     Past Surgical History:  Procedure Laterality Date  . BREAST LUMPECTOMY Left 03/30/2018  . BREAST LUMPECTOMY WITH RADIOACTIVE SEED LOCALIZATION Left 03/30/2018   Procedure: RADIOACTIVE SEED GUIDED LEFT BREAST LUMPECTOMY;  Surgeon: Fanny Skates, MD;  Location: [facility];  Service: General;  Laterality: Left;  . COLONOSCOPY WITH PROPOFOL N/A 11/06/2015   Procedure: COLONOSCOPY WITH PROPOFOL;  Surgeon: Ladene Artist, MD;  [facility];  Service: Endoscopy;  Laterality: N/A;  . ESOPHAGOGASTRODUODENOSCOPY (EGD) WITH PROPOFOL N/A 11/06/2015   Procedure:  ESOPHAGOGASTRODUODENOSCOPY (EGD) WITH PROPOFOL;  Surgeon: Ladene Artist, MD;  [facility];  Service: Endoscopy;  Laterality: N/A;  . EYE SURGERY      Social History   Tobacco Use  . Smoking status: Never Smoker  . Smokeless tobacco: Never Used  Vaping Use  . Vaping Use: Never used  Substance Use Topics  . Alcohol use: No  . Drug use: No    Family History  Problem Relation Age of Onset  . Aneurysm Mother        brain  . Heart disease Father   . Heart disease Brother   . AAA (abdominal aortic aneurysm) Maternal Uncle   . Colon cancer Paternal Uncle        dx >50  . Breast cancer Cousin        pat cousin, dx in her late 58s    Allergies  Allergen Reactions  . Sulfa Antibiotics Hives    Medication list has been reviewed and updated.  Current Outpatient Medications on File Prior to Visit  Medication Sig Dispense Refill  . Ascorbic Acid (VITAMIN C) 500 MG CAPS Take by mouth.    . Cinnamon 500 MG capsule Take 1 capsule (500 mg total) by mouth daily.    . Multiple Vitamins-Minerals (WOMENS MULTIVITAMIN PO) Take by mouth.    . pantoprazole (PROTONIX) 40 MG tablet TAKE 1 TABLET(40 MG) BY MOUTH DAILY 90 tablet 1  . traMADol (ULTRAM) 50 MG tablet TAKE 1 TABLET BY MOUTH EVERY 8 HOURS AS NEEDED FOR MODERATE PAIN 90 tablet 0  . Turmeric 500 MG CAPS Take 1 capsule by mouth 1 day or 1 dose.    . Vitamin D, Ergocalciferol, 50 MCG (2000 UT) CAPS Take by mouth.     No current facility-administered medications on file prior to visit.    Review of Systems:  As per HPI- otherwise negative.  Physical Examination: Vitals:   11/11/20 1318 11/11/20 1337  BP: (!) 156/80 140/80  Pulse: 75   Resp: 17   SpO2: 97%    Vitals:   11/11/20 1318  Weight: 143 lb (64.9 kg)  Height: 5' (1.524 m)   Body mass index is 27.93 kg/m. Ideal Body Weight: Weight in (lb) to have BMI = 25: 127.7  GEN: no acute distress. Minimal overweight  HEENT: Atraumatic, Normocephalic.    Ears  and Nose: No external deformity. CV: RRR, No M/G/R. No JVD. No thrill. No extra  heart sounds. PULM: CTA B, no wheezes, crackles, rhonchi. No retractions. No resp. distress. No accessory muscle use. EXTR: No c/c/e PSYCH: Normally interactive. Conversant.  Elderly pt, looks well Uses 2 canes to walk   BP Readings from Last 3 Encounters:  11/11/20 140/80  09/02/20 134/81  05/13/20 (!) 142/82    Assessment and Plan: Screening for hyperlipidemia - Plan: Lipid panel  Arthritis of both knees  Medication monitoring encounter - Plan: CBC, Basic metabolic panel  Pt here today for a periodic follow-up visit She has knee OA- worse on the right We discussed possible knee surgery, this is something she is considering  For the time being okay to use tramadol as needed for pain control, I also advised her that topical Voltaren gel should be okay Will plan further follow- up pending labs.  This visit occurred during the SARS-CoV-2 public health emergency.  Safety protocols were in place, including screening questions prior to the visit, additional usage of staff PPE, and extensive cleaning of exam room while observing appropriate contact time as indicated for disinfecting solutions.    Signed Lamar Blinks, MD  Addendum 12/11, received labs as below.  Letter to patient  Results for orders placed or performed in visit on 11/11/20  CBC  Result Value Ref Range   WBC 6.9 4.0 - 10.5 K/uL   RBC 4.14 3.87 - 5.11 Mil/uL   Platelets 226.0 150.0 - 400.0 K/uL   Hemoglobin 12.7 12.0 - 15.0 g/dL   HCT 38.3 36.0 - 46.0 %   MCV 92.5 78.0 - 100.0 fl   MCHC 33.1 30.0 - 36.0 g/dL   RDW 15.2 11.5 - 81.8 %  Basic metabolic panel  Result Value Ref Range   Sodium 140 135 - 145 mEq/L   Potassium 5.0 3.5 - 5.1 mEq/L   Chloride 104 96 - 112 mEq/L   CO2 28 19 - 32 mEq/L   Glucose, Bld 93 70 - 99 mg/dL   BUN 12 6 - 23 mg/dL   Creatinine, Ser 1.05 0.40 - 1.20 mg/dL   GFR 48.71 (L) >60.00 mL/min    Calcium 9.4 8.4 - 10.5 mg/dL  Lipid panel  Result Value Ref Range   Cholesterol 165 0 - 200 mg/dL   Triglycerides 63.0 0.0 - 149.0 mg/dL   HDL 70.70 >39.00 mg/dL   VLDL 12.6 0.0 - 40.0 mg/dL   LDL Cholesterol 82 0 - 99 mg/dL   Total CHOL/HDL Ratio 2    NonHDL 94.69

## 2020-11-10 NOTE — Patient Instructions (Addendum)
It was great to see you again today- let me know if you decide to pursue knee surgery I will be in touch with your labs I think ok to try the topical diclofenac I would recommend a covid 19 booster 6 months after your 2nd dose Shingles vaccine can be given at your pharmacy  If all is well please see me in about 6 months

## 2020-11-11 ENCOUNTER — Other Ambulatory Visit: Payer: Self-pay

## 2020-11-11 ENCOUNTER — Encounter: Payer: Self-pay | Admitting: Family Medicine

## 2020-11-11 ENCOUNTER — Ambulatory Visit (INDEPENDENT_AMBULATORY_CARE_PROVIDER_SITE_OTHER): Payer: Medicare Other | Admitting: Family Medicine

## 2020-11-11 VITALS — BP 140/80 | HR 75 | Resp 17 | Ht 60.0 in | Wt 143.0 lb

## 2020-11-11 DIAGNOSIS — Z13220 Encounter for screening for lipoid disorders: Secondary | ICD-10-CM | POA: Diagnosis not present

## 2020-11-11 DIAGNOSIS — Z5181 Encounter for therapeutic drug level monitoring: Secondary | ICD-10-CM | POA: Diagnosis not present

## 2020-11-11 DIAGNOSIS — M170 Bilateral primary osteoarthritis of knee: Secondary | ICD-10-CM | POA: Diagnosis not present

## 2020-11-12 LAB — CBC
HCT: 38.3 % (ref 36.0–46.0)
Hemoglobin: 12.7 g/dL (ref 12.0–15.0)
MCHC: 33.1 g/dL (ref 30.0–36.0)
MCV: 92.5 fl (ref 78.0–100.0)
Platelets: 226 10*3/uL (ref 150.0–400.0)
RBC: 4.14 Mil/uL (ref 3.87–5.11)
RDW: 15.2 % (ref 11.5–15.5)
WBC: 6.9 10*3/uL (ref 4.0–10.5)

## 2020-11-12 LAB — BASIC METABOLIC PANEL
BUN: 12 mg/dL (ref 6–23)
CO2: 28 mEq/L (ref 19–32)
Calcium: 9.4 mg/dL (ref 8.4–10.5)
Chloride: 104 mEq/L (ref 96–112)
Creatinine, Ser: 1.05 mg/dL (ref 0.40–1.20)
GFR: 48.71 mL/min — ABNORMAL LOW (ref >60.00)
Glucose, Bld: 93 mg/dL (ref 70–99)
Potassium: 5 mEq/L (ref 3.5–5.1)
Sodium: 140 mEq/L (ref 135–145)

## 2020-11-12 LAB — LIPID PANEL
Cholesterol: 165 mg/dL (ref 0–200)
HDL: 70.7 mg/dL (ref >39.00)
LDL Cholesterol: 82 mg/dL (ref 0–99)
NonHDL: 94.69
Total CHOL/HDL Ratio: 2
Triglycerides: 63 mg/dL (ref 0.0–149.0)
VLDL: 12.6 mg/dL (ref 0.0–40.0)

## 2020-11-18 ENCOUNTER — Emergency Department (HOSPITAL_COMMUNITY)
Admission: EM | Admit: 2020-11-18 | Discharge: 2020-11-18 | Disposition: A | Discharge status: Home / Self Care | Payer: Medicare Other | Attending: Emergency Medicine | Admitting: Emergency Medicine

## 2020-11-18 ENCOUNTER — Emergency Department (HOSPITAL_COMMUNITY): Payer: Medicare Other

## 2020-11-18 ENCOUNTER — Encounter (HOSPITAL_COMMUNITY): Payer: Self-pay

## 2020-11-18 ENCOUNTER — Other Ambulatory Visit: Payer: Self-pay

## 2020-11-18 DIAGNOSIS — J320 Chronic maxillary sinusitis: Secondary | ICD-10-CM | POA: Diagnosis not present

## 2020-11-18 DIAGNOSIS — G510 Bell's palsy: Secondary | ICD-10-CM | POA: Diagnosis not present

## 2020-11-18 DIAGNOSIS — G459 Transient cerebral ischemic attack, unspecified: Secondary | ICD-10-CM | POA: Diagnosis not present

## 2020-11-18 DIAGNOSIS — M542 Cervicalgia: Secondary | ICD-10-CM

## 2020-11-18 DIAGNOSIS — Z853 Personal history of malignant neoplasm of breast: Secondary | ICD-10-CM | POA: Diagnosis not present

## 2020-11-18 DIAGNOSIS — I6522 Occlusion and stenosis of left carotid artery: Secondary | ICD-10-CM | POA: Diagnosis not present

## 2020-11-18 DIAGNOSIS — Z743 Need for continuous supervision: Secondary | ICD-10-CM | POA: Diagnosis not present

## 2020-11-18 DIAGNOSIS — R52 Pain, unspecified: Secondary | ICD-10-CM

## 2020-11-18 DIAGNOSIS — I672 Cerebral atherosclerosis: Secondary | ICD-10-CM | POA: Diagnosis not present

## 2020-11-18 DIAGNOSIS — G319 Degenerative disease of nervous system, unspecified: Secondary | ICD-10-CM | POA: Diagnosis not present

## 2020-11-18 DIAGNOSIS — C50919 Malignant neoplasm of unspecified site of unspecified female breast: Secondary | ICD-10-CM | POA: Diagnosis not present

## 2020-11-18 DIAGNOSIS — R6889 Other general symptoms and signs: Secondary | ICD-10-CM | POA: Diagnosis not present

## 2020-11-18 DIAGNOSIS — J0100 Acute maxillary sinusitis, unspecified: Secondary | ICD-10-CM | POA: Diagnosis not present

## 2020-11-18 DIAGNOSIS — J0130 Acute sphenoidal sinusitis, unspecified: Secondary | ICD-10-CM | POA: Diagnosis not present

## 2020-11-18 DIAGNOSIS — R29818 Other symptoms and signs involving the nervous system: Secondary | ICD-10-CM | POA: Diagnosis not present

## 2020-11-18 LAB — CBC
HCT: 36 % (ref 36.0–46.0)
Hemoglobin: 12 g/dL (ref 12.0–15.0)
MCH: 31.2 pg (ref 26.0–34.0)
MCHC: 33.3 g/dL (ref 30.0–36.0)
MCV: 93.5 fL (ref 80.0–100.0)
Platelets: 185 10*3/uL (ref 150–400)
RBC: 3.85 MIL/uL — ABNORMAL LOW (ref 3.87–5.11)
RDW: 14.3 % (ref 11.5–15.5)
WBC: 8 10*3/uL (ref 4.0–10.5)
nRBC: 0 % (ref 0.0–0.2)

## 2020-11-18 LAB — COMPREHENSIVE METABOLIC PANEL
ALT: 10 U/L (ref 0–44)
AST: 14 U/L — ABNORMAL LOW (ref 15–41)
Albumin: 3.6 g/dL (ref 3.5–5.0)
Alkaline Phosphatase: 70 U/L (ref 38–126)
Anion gap: 8 (ref 5–15)
BUN: 10 mg/dL (ref 8–23)
CO2: 26 mmol/L (ref 22–32)
Calcium: 9.2 mg/dL (ref 8.9–10.3)
Chloride: 106 mmol/L (ref 98–111)
Creatinine, Ser: 0.92 mg/dL (ref 0.44–1.00)
GFR, Estimated: >60 mL/min (ref >60)
Glucose, Bld: 96 mg/dL (ref 70–99)
Potassium: 4.1 mmol/L (ref 3.5–5.1)
Sodium: 140 mmol/L (ref 135–145)
Total Bilirubin: 0.8 mg/dL (ref 0.3–1.2)
Total Protein: 6.2 g/dL — ABNORMAL LOW (ref 6.5–8.1)

## 2020-11-18 MED ORDER — IOHEXOL 350 MG/ML SOLN
75.0000 mL | Freq: Once | INTRAVENOUS | Status: AC | PRN
  Administered 2020-11-18: 75 mL via INTRAVENOUS

## 2020-11-18 MED ORDER — OXYCODONE-ACETAMINOPHEN 5-325 MG PO TABS
1.0000 | ORAL_TABLET | Freq: Once | ORAL | Status: AC
  Administered 2020-11-18: 1 via ORAL
  Filled 2020-11-18: qty 1

## 2020-11-18 MED ORDER — METHOCARBAMOL 500 MG PO TABS: 500.0000 mg | ORAL_TABLET | Freq: Three times a day (TID) | ORAL | 0 refills | Status: DC | PRN

## 2020-11-18 NOTE — ED Notes (Signed)
Pt taken to MRI  

## 2020-11-18 NOTE — ED Provider Notes (Signed)
[facility] EMERGENCY DEPARTMENT Provider Note   CSN: [id number] Arrival date & time: 11/18/20  0545     History Chief Complaint  Patient presents with   Neck Pain    Kathy Thornton is a 86 y.o. female.  HPI Patient presents with left-sided neck pain.  Began around 3:00 yesterday.  She taken a bath around 2 hours before but otherwise to start her while she was at rest.  Pain worse with turning left.  It is in the left side of the neck and the back of the neck.  States it radiates down the arm has some tingling in the arm.  No trauma.  Also states she has a facial droop on the left.  States she has had Bell's palsy in the past but normally does not have any deficit.  No relief with her tramadol at home.  No chest pain.  Morning also history of breast cancer.  No fevers or chills.  Patient's mother had a brain aneurysm    Past Medical History:  Diagnosis Date   Breast cancer ([place]) 2019   Left Breast Cancer   Breast cancer in female [facility])    Left   Cameron lesion, acute: Per EGD 11/06/2015 11/06/2015   Cataract    Erosive gastritis: per EGD 11/05/2105 11/06/2015   Family history of breast cancer    Family history of colon cancer    Heart murmur    Hemorrhoids; Grade 1 per colonoscopy 11/06/2015 11/06/2015   Knee pain     Patient Active Problem List   Diagnosis Date Noted   Genetic testing 03/27/2018   Family history of colon cancer    Family history of breast cancer    Malignant neoplasm of upper-inner quadrant of left breast in female, estrogen receptor positive ([place]) 01/02/2018   Osteoporosis 01/02/2018   Cameron lesion, acute: Per EGD 11/06/2015 11/06/2015   Erosive gastritis: per EGD 11/05/2105 11/06/2015   Hemorrhoids; Grade 1 per colonoscopy 11/06/2015 11/06/2015   Anemia, iron deficiency    Acute gastric ulcer    Anemia 11/05/2015   Symptomatic anemia 11/05/2015   Heart murmur 11/05/2015   Lower urinary tract infectious  disease 11/05/2015   Lower extremity edema 11/05/2015   Arthritis of both knees 11/04/2015    Past Surgical History:  Procedure Laterality Date   BREAST LUMPECTOMY Left 03/30/2018   BREAST LUMPECTOMY WITH RADIOACTIVE SEED LOCALIZATION Left 03/30/2018   Procedure: RADIOACTIVE SEED GUIDED LEFT BREAST LUMPECTOMY;  Surgeon: Fanny Skates, MD;  Location: [place];  Service: General;  Laterality: Left;   COLONOSCOPY WITH PROPOFOL N/A 11/06/2015   Procedure: COLONOSCOPY WITH PROPOFOL;  Surgeon: Ladene Artist, MD;  Location: [facility];  Service: Endoscopy;  Laterality: N/A;   ESOPHAGOGASTRODUODENOSCOPY (EGD) WITH PROPOFOL N/A 11/06/2015   Procedure: ESOPHAGOGASTRODUODENOSCOPY (EGD) WITH PROPOFOL;  Surgeon: Ladene Artist, MD;  Location: [facility];  Service: Endoscopy;  Laterality: N/A;   EYE SURGERY       OB History   No obstetric history on file.     Family History  Problem Relation Age of Onset   Aneurysm Mother        brain   Heart disease Father    Heart disease Brother    AAA (abdominal aortic aneurysm) Maternal Uncle    Colon cancer Paternal Uncle        dx >50   Breast cancer Cousin        pat cousin, dx in her late 35s  Social History   Tobacco Use   Smoking status: Never Smoker   Smokeless tobacco: Never Used  Vaping Use   Vaping Use: Never used  Substance Use Topics   Alcohol use: No   Drug use: No    Home Medications Prior to Admission medications   Medication Sig Start Date End Date Taking? Authorizing Provider  Ascorbic Acid (VITAMIN C) 500 MG CAPS Take 500 mg by mouth daily.   Yes [provider]  Cinnamon 500 MG capsule Take 1 capsule (500 mg total) by mouth daily. 06/11/18  Yes Magrinat, Virgie Dad, MD  Multiple Vitamins-Minerals (WOMENS MULTIVITAMIN PO) Take by mouth.   Yes [provider]  pantoprazole (PROTONIX) 40 MG tablet TAKE 1 TABLET(40 MG) BY MOUTH DAILY Patient taking differently: Take 40 mg by mouth daily.  TAKE 1 TABLET(40 MG) BY MOUTH DAILY 09/05/20  Yes Mosie Lukes, MD  traMADol (ULTRAM) 50 MG tablet TAKE 1 TABLET BY MOUTH EVERY 8 HOURS AS NEEDED FOR MODERATE PAIN Patient taking differently: Take 50 mg by mouth every 8 (eight) hours as needed for moderate pain. 11/03/20  Yes Copland, Gay Filler, MD  Trolamine Salicylate (ASPERCREME EX) Apply 1 application topically daily as needed (pain).   Yes [provider]  Turmeric 500 MG CAPS Take 1 capsule by mouth 1 day or 1 dose. 06/11/18  Yes Magrinat, Virgie Dad, MD  Vitamin D, Ergocalciferol, 50 MCG (2000 UT) CAPS Take 2,000 Units by mouth daily.   Yes [provider]  methocarbamol (ROBAXIN) 500 MG tablet Take 1 tablet (500 mg total) by mouth every 8 (eight) hours as needed for muscle spasms. 11/18/20   Davonna Belling, MD    Allergies    Sulfa antibiotics  Review of Systems   Review of Systems  Constitutional: Negative for appetite change and fever.  HENT: Negative for congestion.   Eyes: Negative for visual disturbance.  Respiratory: Negative for shortness of breath.   Cardiovascular: Negative for chest pain.  Gastrointestinal: Negative for abdominal pain.  Genitourinary: Negative for flank pain.  Musculoskeletal: Positive for neck pain.  Skin: Negative for wound.  Neurological: Positive for facial asymmetry and numbness.  Psychiatric/Behavioral: Negative for confusion.    Physical Exam Updated Vital Signs BP (!) 155/74 (BP Location: Right Arm)    Pulse 60    Temp 97.8 F (36.6 C) (Oral)    Resp 18    SpO2 97%   Physical Exam Vitals and nursing note reviewed.  HENT:     Head: Atraumatic.     Mouth/Throat:     Mouth: Mucous membranes are moist.  Eyes:     General: No scleral icterus.    Extraocular Movements: Extraocular movements intact.     Pupils: Pupils are equal, round, and reactive to light.  Neck:     Comments: Midline tenderness on cervical spine. Cardiovascular:     Rate and Rhythm: Normal rate and  regular rhythm.  Chest:     Chest wall: No tenderness.  Abdominal:     Tenderness: There is no abdominal tenderness.  Musculoskeletal:     Cervical back: Neck supple.     Comments: Tenderness over cervical spine.  Tenderness over musculature on left neck also.  May have mild asymmetry of some left neck musculature.  Skin:    General: Skin is warm.     Capillary Refill: Capillary refill takes less than 2 seconds.  Neurological:     Mental Status: She is alert.     Comments:  Eye movements intact.  Good eyebrow raise bilaterally, however does have left lower mild facial droop.  History of Bell's palsy but states no baseline deficits.  May have mildly decreased strength in left upper extremity.  Sensation grossly intact.  Good strength bilateral lower extremities.     ED Results / Procedures / Treatments   Labs (all labs ordered are listed, but only abnormal results are displayed) Labs Reviewed  COMPREHENSIVE METABOLIC PANEL - Abnormal; Notable for the following components:      Result Value   Total Protein 6.2 (*)    AST 14 (*)    All other components within normal limits  CBC - Abnormal; Notable for the following components:   RBC 3.85 (*)    All other components within normal limits    EKG None  Radiology CT Angio Head W or Wo Contrast  Result Date: 11/18/2020 CLINICAL DATA:  Acute neuro deficit.  Breast cancer. EXAM: CT ANGIOGRAPHY HEAD AND NECK TECHNIQUE: Multidetector CT imaging of the head and neck was performed using the standard protocol during bolus administration of intravenous contrast. Multiplanar CT image reconstructions and MIPs were obtained to evaluate the vascular anatomy. Carotid stenosis measurements (when applicable) are obtained utilizing NASCET criteria, using the distal internal carotid diameter as the denominator. CONTRAST:  25mL OMNIPAQUE IOHEXOL 350 MG/ML SOLN COMPARISON:  None. FINDINGS: CT HEAD FINDINGS Brain: Mild atrophy. Mild to moderate white matter  changes with patchy white matter hypodensity bilaterally. Negative for acute infarct, hemorrhage, mass. Vascular: Negative for hyperdense vessel Skull: Negative Sinuses: Mucosal edema with air-fluid level right maxillary sinus. Associated bony thickening right maxillary sinus. Mucosal thickening and bony thickening right sphenoid sinus. Remaining sinuses clear. Mastoid clear. Orbits: Bilateral cataract extraction.  No orbital mass. Review of the MIP images confirms the above findings CTA NECK FINDINGS Aortic arch: Standard branching. Imaged portion shows no evidence of aneurysm or dissection. No significant stenosis of the major arch vessel origins. Right carotid system: Right carotid widely patent without stenosis. Left carotid system: Mild atherosclerotic calcification left carotid bifurcation. Negative for stenosis. Vertebral arteries: Both vertebral arteries widely patent to the basilar without stenosis. Skeleton: Multilevel disc and facet degeneration throughout the cervical spine with foraminal encroachment bilaterally due to spurring. Other neck: Negative for soft tissue mass or adenopathy. Upper chest: Lung apices clear bilaterally. Superior mediastinum negative. Review of the MIP images confirms the above findings CTA HEAD FINDINGS Anterior circulation: Very mild atherosclerotic disease in the cavernous carotid bilaterally without stenosis. Normal anterior and middle cerebral arteries. Posterior circulation: Both vertebral arteries patent to the basilar. Small PICA bilaterally. Basilar widely patent. AICA, superior cerebellar, posterior cerebral arteries widely patent without stenosis or large vessel occlusion. Venous sinuses: Normal venous enhancement. Anatomic variants: None Review of the MIP images confirms the above findings IMPRESSION: 1. Negative for intracranial large vessel occlusion or flow limiting stenosis. 2. Both carotid arteries and vertebral arteries in the neck widely patent without  stenosis. Very mild atherosclerotic disease. Electronically Signed   By: Franchot Gallo M.D.   On: 11/18/2020 10:18   CT Angio Neck W and/or Wo Contrast  Result Date: 11/18/2020 CLINICAL DATA:  Acute neuro deficit.  Breast cancer. EXAM: CT ANGIOGRAPHY HEAD AND NECK TECHNIQUE: Multidetector CT imaging of the head and neck was performed using the standard protocol during bolus administration of intravenous contrast. Multiplanar CT image reconstructions and MIPs were obtained to evaluate the vascular anatomy. Carotid stenosis measurements (when applicable) are obtained utilizing NASCET criteria, using the distal internal  carotid diameter as the denominator. CONTRAST:  65mL OMNIPAQUE IOHEXOL 350 MG/ML SOLN COMPARISON:  None. FINDINGS: CT HEAD FINDINGS Brain: Mild atrophy. Mild to moderate white matter changes with patchy white matter hypodensity bilaterally. Negative for acute infarct, hemorrhage, mass. Vascular: Negative for hyperdense vessel Skull: Negative Sinuses: Mucosal edema with air-fluid level right maxillary sinus. Associated bony thickening right maxillary sinus. Mucosal thickening and bony thickening right sphenoid sinus. Remaining sinuses clear. Mastoid clear. Orbits: Bilateral cataract extraction.  No orbital mass. Review of the MIP images confirms the above findings CTA NECK FINDINGS Aortic arch: Standard branching. Imaged portion shows no evidence of aneurysm or dissection. No significant stenosis of the major arch vessel origins. Right carotid system: Right carotid widely patent without stenosis. Left carotid system: Mild atherosclerotic calcification left carotid bifurcation. Negative for stenosis. Vertebral arteries: Both vertebral arteries widely patent to the basilar without stenosis. Skeleton: Multilevel disc and facet degeneration throughout the cervical spine with foraminal encroachment bilaterally due to spurring. Other neck: Negative for soft tissue mass or adenopathy. Upper chest: Lung  apices clear bilaterally. Superior mediastinum negative. Review of the MIP images confirms the above findings CTA HEAD FINDINGS Anterior circulation: Very mild atherosclerotic disease in the cavernous carotid bilaterally without stenosis. Normal anterior and middle cerebral arteries. Posterior circulation: Both vertebral arteries patent to the basilar. Small PICA bilaterally. Basilar widely patent. AICA, superior cerebellar, posterior cerebral arteries widely patent without stenosis or large vessel occlusion. Venous sinuses: Normal venous enhancement. Anatomic variants: None Review of the MIP images confirms the above findings IMPRESSION: 1. Negative for intracranial large vessel occlusion or flow limiting stenosis. 2. Both carotid arteries and vertebral arteries in the neck widely patent without stenosis. Very mild atherosclerotic disease. Electronically Signed   By: Franchot Gallo M.D.   On: 11/18/2020 10:18   MR BRAIN WO CONTRAST  Result Date: 11/18/2020 CLINICAL DATA:  Neuro deficit, acute, stroke suspected. EXAM: MRI HEAD WITHOUT CONTRAST TECHNIQUE: Multiplanar, multiecho pulse sequences of the brain and surrounding structures were obtained without intravenous contrast. COMPARISON:  CT of the head November 18, 2020. FINDINGS: Brain: No acute infarction, hemorrhage, hydrocephalus, extra-axial collection or mass lesion. Scattered and confluent foci of T2 hyperintensity are seen within the white matter of the cerebral hemispheres, predominantly periventricular, nonspecific, most likely related to chronic microangiopathic changes. Vascular: Normal flow voids. Skull and upper cervical spine: Normal marrow signal. Sinuses/Orbits: Bilateral lens surgery. Mucosal thickening of the right sphenoid and maxillary sinuses with fluid level within the right maxillary sinus. Thickening of the right maxillary sinus lobes, consistent with chronicity. IMPRESSION: 1. No acute intracranial abnormality. 2. Mild to moderate  chronic microangiopathic changes. 3. Acute on chronic right maxillary and sphenoid sinusitis. Electronically Signed   By: Pedro Earls M.D.   On: 11/18/2020 12:23   CT C-SPINE NO CHARGE  Result Date: 11/18/2020 CLINICAL DATA:  Neck pain.  Acute known malignancy. EXAM: CT CERVICAL SPINE WITHOUT CONTRAST TECHNIQUE: Multidetector CT imaging of the cervical spine was performed without intravenous contrast. Multiplanar CT image reconstructions were also generated. COMPARISON:  X-ray cervical spine 06/09/2012 FINDINGS: Alignment: Stable grade 1 anterolisthesis of C4 on C5. Skull base and vertebrae: Moderate multilevel degenerative changes of the spine with osteophyte formation and uncovertebral arthropathy. Associated multilevel at least moderate osseous neural foraminal stenosis. No severe osseous central canal stenosis. No acute fracture. No suspicious lytic or blastic osseous lesions. No focal pathologic process. Soft tissues and spinal canal: No prevertebral fluid or swelling. No visible canal hematoma. Disc levels:  Maintained. Upper chest: Unremarkable. Other: Right maxillary sinus mucosal thickening. IMPRESSION: 1. No acute displaced fracture or traumatic listhesis of the cervical spine. 2. No suspicious lytic or blastic osseous lesions. 3. Moderate degenerative changes of the cervical spine with multilevel at least moderate osseous neural foraminal stenosis. 4. Please see separately dictated CT angio head and neck 11/18/2020. Electronically Signed   By: Iven Finn M.D.   On: 11/18/2020 10:19    Procedures Procedures (including critical care time)  Medications Ordered in ED Medications  oxyCODONE-acetaminophen (PERCOCET/ROXICET) 5-325 MG per tablet 1 tablet (1 tablet Oral Given 11/18/20 0756)  iohexol (OMNIPAQUE) 350 MG/ML injection 75 mL (75 mLs Intravenous Contrast Given 11/18/20 1000)    ED Course  I have reviewed the triage vital signs and the nursing notes.  Pertinent  labs & imaging results that were available during my care of the patient were reviewed by me and considered in my medical decision making (see chart for details).    MDM Rules/Calculators/A&P                          Patient presents with neck pain radiating to left arm.  Worse with movements.  May have some tingling in the arm also.  With age and cancer history CT imaging done of head and neck.  Had done because has left lower facial droop.  History of Bell's palsy but states does not normally have a deficit.  However forehead is not involved warning for a central cause of the facial droop.  CT imaging reassuring.  MRI also does not show acute stroke.  Does have some sinusitis but no sinus symptoms will not empirically treat at this point.  Discharge home with outpatient follow-up  Initial differential diagnosis included stroke, cervical spine malignancy.  Musculoskeletal neck pain.  It was not limited to these diagnoses however. Final Clinical Impression(s) / ED Diagnoses Final diagnoses:  Neck pain  Bell's palsy    Rx / DC Orders ED Discharge Orders         Ordered    methocarbamol (ROBAXIN) 500 MG tablet  Every 8 hours PRN        11/18/20 1246           Davonna Belling, MD 11/18/20 1250

## 2020-11-18 NOTE — ED Triage Notes (Signed)
Pt BIB [facility] for eval of neck pain, beginning at 3pm yesterday (12/14). Pt states she bathed & went to sit on the couch & noticed pain in her neck "all of a sudden." Pt able to rotate head to the right but feels unable to turn left, as pain goes from behind ear to shoulder. Pt states that pain feels "like a crick." Per both EMS & pt, pt reported that massage helped

## 2020-11-18 NOTE — ED Notes (Signed)
Pt taken to CT.

## 2021-01-20 DIAGNOSIS — M1611 Unilateral primary osteoarthritis, right hip: Secondary | ICD-10-CM | POA: Diagnosis not present

## 2021-01-28 ENCOUNTER — Other Ambulatory Visit: Payer: Self-pay | Admitting: Family Medicine

## 2021-01-28 DIAGNOSIS — M170 Bilateral primary osteoarthritis of knee: Secondary | ICD-10-CM

## 2021-05-07 ENCOUNTER — Other Ambulatory Visit: Payer: Self-pay | Admitting: Family Medicine

## 2021-05-07 DIAGNOSIS — Z8711 Personal history of peptic ulcer disease: Secondary | ICD-10-CM

## 2021-05-09 NOTE — Progress Notes (Addendum)
[facility] [address], [place], [place] [address] [phone]  Date:  05/12/2021   Name:  Kathy Thornton   DOB:  26-Dec-1933   MRN:  [id number]  PCP:  Darreld Mclean, MD    Chief Complaint: 6 month follow up (Concerns/ questions: pt has had some Fluid on her legs. )   History of Present Illness:  Kathy Thornton is a 87 y.o. very pleasant female patient who presents with the following:  Here today for a periodic follow-up visit  Last seen by myself in December History of breast cancer 2018, chronic arthritis pain, osteoporosis, chronic renal insufficiency She had an ulcer in 2018 or 2019 due to NSAID use, we now use tramadol for her knee pain  Can offer a pneumonia booster-deferred today as patient was found to be in A. fib covid booster- recommended to her today  Most recent GFR on chart from December shows improvement   Here today with her daughter [name] She has noted some swelling of both ankles off and on- this is not a new issue but is becoming more pronounced over the last couple of weeks Typically she sleeps with her ankles elevated and in the am her ankles are better- however recntly her swelling may persist overnight and not completely resolved in the morning Her knees limit her walking some -she wonders if decreased walking is contributing to her swelling  No orthopnea No SOB  She is getting steroid shots in her knees and hips, but they have not done an injection in some months She is taking tramadol as needed- typically about once a day   We noted today that she is in atrial fib.  She denies any chest pain or palpitations Never been in atrial fibrillation previously  Wt Readings from Last 3 Encounters:  05/12/21 144 lb 6.4 oz (65.5 kg)  11/11/20 143 lb (64.9 kg)  09/02/20 143 lb 4.8 oz (65 kg)    04/26/2021  01/28/2021   1  Tramadol Hcl 50 Mg Tablet  90.00  30  Je Cop  [id number]  [facility] (2191)   1/3  15.00 MME  Medicare  [place]    01/28/2021  01/28/2021   1  Tramadol Hcl 50 Mg Tablet  90.00  30  Je Cop  [id number]  [facility] (2191)  0/3  15.00 MME  Medicare  [place]    11/03/2020  11/03/2020   1  Tramadol Hcl 50 Mg Tablet  90.00  30  Je Cop  [id number]  [facility] (2191)  0/0  15.00 MME  Medicare  [place]    07/30/2020  05/13/2020   1  Tramadol Hcl 50 Mg Tablet  90.00  30  Je Cop  [id number]  [facility] (2191)  1/1  15.00 MME  Medicare  [place]    05/19/2020  05/13/2020   1  Tramadol Hcl 50 Mg Tablet  90.00  30  Je Cop  [id number]  [facility] (2191)  0/1  15.00 MME  Medicare  [place]    04/17/2020  01/10/2020   1  Tramadol Hcl 50 Mg Tablet  30.00  10  Je Cop  [id number]  [facility] (2191)  3/3  15.00 MME  Medicare  [place]    03/18/2020  01/10/2020   2  Tramadol Hcl 50 Mg Tablet  30.00  10  Je Cop            Patient Active Problem List  Diagnosis Date Noted   Genetic testing 03/27/2018   Family history of colon cancer    Family history of breast cancer    Malignant neoplasm of upper-inner quadrant of left breast in female, estrogen receptor positive ([place]) 01/02/2018   Osteoporosis 01/02/2018   Cameron lesion, acute: Per EGD 11/06/2015 11/06/2015   Erosive gastritis: per EGD 11/05/2105 11/06/2015   Hemorrhoids; Grade 1 per colonoscopy 11/06/2015 11/06/2015   Anemia, iron deficiency    Acute gastric ulcer    Anemia 11/05/2015   Symptomatic anemia 11/05/2015   Heart murmur 11/05/2015   Lower urinary tract infectious disease 11/05/2015   Lower extremity edema 11/05/2015   Arthritis of both knees 11/04/2015    Past Medical History:  Diagnosis Date   Breast cancer ([place]) 2019   Left Breast Cancer   Breast cancer in female [facility])    Left   Cameron lesion, acute: Per EGD 11/06/2015 11/06/2015   Cataract    Erosive gastritis: per EGD 11/05/2105 11/06/2015   Family history of breast cancer    Family history of colon cancer    Heart murmur    Hemorrhoids; Grade 1 per colonoscopy 11/06/2015 11/06/2015   Knee pain     Past Surgical History:  Procedure  Laterality Date   BREAST LUMPECTOMY Left 03/30/2018   BREAST LUMPECTOMY WITH RADIOACTIVE SEED LOCALIZATION Left 03/30/2018   Procedure: RADIOACTIVE SEED GUIDED LEFT BREAST LUMPECTOMY;  Surgeon: Fanny Skates, MD;  Location: [facility];  Service: General;  Laterality: Left;   COLONOSCOPY WITH PROPOFOL N/A 11/06/2015   Procedure: COLONOSCOPY WITH PROPOFOL;  Surgeon: Ladene Artist, MD;  Location: [facility];  Service: Endoscopy;  Laterality: N/A;   ESOPHAGOGASTRODUODENOSCOPY (EGD) WITH PROPOFOL N/A 11/06/2015   Procedure: ESOPHAGOGASTRODUODENOSCOPY (EGD) WITH PROPOFOL;  Surgeon: Ladene Artist, MD;  Location: [facility];  Service: Endoscopy;  Laterality: N/A;   EYE SURGERY      Social History   Tobacco Use   Smoking status: Never Smoker   Smokeless tobacco: Never Used  Vaping Use   Vaping Use: Never used  Substance Use Topics   Alcohol use: No   Drug use: No    Family History  Problem Relation Age of Onset   Aneurysm Mother        brain   Heart disease Father    Heart disease Brother    AAA (abdominal aortic aneurysm) Maternal Uncle    Colon cancer Paternal Uncle        dx >50   Breast cancer Cousin        pat cousin, dx in her late 1s    Allergies  Allergen Reactions   Sulfa Antibiotics Hives    Medication list has been reviewed and updated.  Current Outpatient Medications on File Prior to Visit  Medication Sig Dispense Refill   Ascorbic Acid (VITAMIN C) 500 MG CAPS Take 500 mg by mouth daily.     Cinnamon 500 MG capsule Take 1 capsule (500 mg total) by mouth daily.     methocarbamol (ROBAXIN) 500 MG tablet Take 1 tablet (500 mg total) by mouth every 8 (eight) hours as needed for muscle spasms. 8 tablet 0   Multiple Vitamins-Minerals (WOMENS MULTIVITAMIN PO) Take by mouth.     pantoprazole (PROTONIX) 40 MG tablet TAKE 1 TABLET(40 MG) BY MOUTH DAILY 90 tablet 1   traMADol (ULTRAM) 50 MG tablet TAKE 1 TABLET BY MOUTH EVERY 8 HOURS AS NEEDED FOR MODERATE PAIN 90 tablet  3   Trolamine Salicylate (ASPERCREME EX)  Apply 1 application topically daily as needed (pain).     Turmeric 500 MG CAPS Take 1 capsule by mouth 1 day or 1 dose.     UNABLE TO FIND in the morning and at bedtime. Beet Vitamin     Vitamin D, Ergocalciferol, 50 MCG (2000 UT) CAPS Take 2,000 Units by mouth daily.     No current facility-administered medications on file prior to visit.    Review of Systems:  As per HPI- otherwise negative.   Physical Examination: Vitals:   05/12/21 1300  BP: (!) 142/80  Pulse: (!) 56  Resp: 18  Temp: 97.9 F (36.6 C)  SpO2: 100%   Vitals:   05/12/21 1300  Weight: 144 lb 6.4 oz (65.5 kg)  Height: 5' (1.524 m)   Body mass index is 28.2 kg/m. Ideal Body Weight: Weight in (lb) to have BMI = 25: 127.7  GEN: no acute distress.  Minimal overweight, looks well normal self HEENT: Atraumatic, Normocephalic.  Ears and Nose: No external deformity. CV: irregular pulse, No M/G/R. No JVD. No thrill. No extra heart sounds. PULM: CTA B, no wheezes, crackles, rhonchi. No retractions. No resp. distress. No accessory muscle use. ABD: S, NT, ND, +BS. No rebound. No HSM. EXTR: No c/c1-2+edema of both LE Feet are normal except for swelling, normal pulses bilaterally  There is a firm, tender area of the lateral left leg with some hyperpigmentation -this feels like fibrous tissue perhaps from an old injury PSYCH: Normally interactive. Conversant.   EKG: new onset a fib, rate 114 I called and discussed her case with Dr. Audie Box, doc of the day with [facility] cardiology He agreed with diagnosis of atrial fibrillation, we will anticoagulate the patient based on her risk factors and started on a low-dose of metoprolol for rate control Assessment and Plan: Lower extremity edema - Plan: Basic metabolic panel, CBC, B Nat Peptide, US Venous Img Lower Unilateral Left  Irregularly irregular pulse rhythm - Plan: EKG 12-Lead  Atrial fibrillation, unspecified type ([place]) - Plan:  TSH, Ambulatory referral to Cardiology, apixaban (ELIQUIS) 5 MG TABS tablet, metoprolol succinate (TOPROL-XL) 25 MG 24 hr tablet   Aimy is here today for routine follow-up visit.  She is noted to be in new onset atrial fibrillation As above, lab work is pending.  Started on Eliquis 5 mg twice daily, Toprol-XL 25 Referral placed to cardiology We will obtain a lower extremity ultrasound to ensure the area on her left leg is not actually a clot There asked to contact me if any problems or concerns.  Spent time discussing atrial fibrillation with patient and her daughter [name] and they feel comfortable with diagnosis and plan This visit occurred during the SARS-CoV-2 public health emergency.  Safety protocols were in place, including screening questions prior to the visit, additional usage of staff PPE, and extensive cleaning of exam room while observing appropriate contact time as indicated for disinfecting solutions.    Signed Lamar Blinks, MD  Received her labs as below 6/9- called family and [facility] Labs are ok Mild increase in BNP is likely of no consequence Await [place] report  Letter to patient   Results for orders placed or performed in visit on 12/06/70  Basic metabolic panel  Result Value Ref Range   Sodium 141 135 - 145 mEq/L   Potassium 4.2 3.5 - 5.1 mEq/L   Chloride 105 96 - 112 mEq/L   CO2 27 19 - 32 mEq/L   Glucose, Bld 87 70 - 99 mg/dL  BUN 14 6 - 23 mg/dL   Creatinine, Ser 1.01 0.40 - 1.20 mg/dL   GFR 50.85 (L) >60.00 mL/min   Calcium 9.2 8.4 - 10.5 mg/dL  CBC  Result Value Ref Range   WBC 5.5 4.0 - 10.5 K/uL   RBC 4.05 3.87 - 5.11 Mil/uL   Platelets 179.0 150.0 - 400.0 K/uL   Hemoglobin 12.4 12.0 - 15.0 g/dL   HCT 37.1 36.0 - 46.0 %   MCV 91.7 78.0 - 100.0 fl   MCHC 33.4 30.0 - 36.0 g/dL   RDW 14.9 11.5 - 15.5 %  B Nat Peptide  Result Value Ref Range   Pro B Natriuretic peptide (BNP) 199.0 (H) 0.0 - 100.0 pg/mL  TSH  Result Value Ref Range   TSH 1.80  0.35 - 4.50 uIU/mL   Received her LE [place] as below 6/10- called and spoke with her daughter [name]   IMPRESSION: 1. No evidence of DVT within the left lower extremity. 2. Sonographic evaluation of the patient's area of bruising involving the lateral aspect the left correlates with an ill-defined area of hypoechoic echogenicity which appears to course along one of the calf muscles, nonspecific though could represent a dissecting hematoma. Clinical correlation is advised.  Pt injured her left leg "3-4 years ago," has not been tender until she developed swelling associated with a fib. I spoke with radiology about possible dissecting hematoma- given clinical history this is very unlikely They note that swelling is already better with rate control.  Unk [place] that we will get an echo as well for her mom prior to cardiology eval They will let me know if any worsening or other concerns in the meantime  JC

## 2021-05-12 ENCOUNTER — Encounter: Payer: Self-pay | Admitting: Family Medicine

## 2021-05-12 ENCOUNTER — Other Ambulatory Visit: Payer: Self-pay

## 2021-05-12 ENCOUNTER — Telehealth: Payer: Self-pay | Admitting: Cardiovascular Disease

## 2021-05-12 ENCOUNTER — Ambulatory Visit (INDEPENDENT_AMBULATORY_CARE_PROVIDER_SITE_OTHER): Payer: Medicare Other | Admitting: Family Medicine

## 2021-05-12 VITALS — BP 142/80 | HR 56 | Temp 97.9°F | Resp 18 | Ht 60.0 in | Wt 144.4 lb

## 2021-05-12 DIAGNOSIS — R600 Localized edema: Secondary | ICD-10-CM | POA: Diagnosis not present

## 2021-05-12 DIAGNOSIS — I4891 Unspecified atrial fibrillation: Secondary | ICD-10-CM

## 2021-05-12 DIAGNOSIS — I499 Cardiac arrhythmia, unspecified: Secondary | ICD-10-CM | POA: Diagnosis not present

## 2021-05-12 MED ORDER — ELIQUIS 5 MG PO TABS: 5.0000 mg | ORAL_TABLET | Freq: Two times a day (BID) | ORAL | 3 refills | Status: DC

## 2021-05-12 MED ORDER — METOPROLOL SUCCINATE ER 25 MG PO TB24: 25.0000 mg | ORAL_TABLET | Freq: Every day | ORAL | 3 refills | Status: DC

## 2021-05-12 NOTE — Patient Instructions (Addendum)
It was good to see you again today!  I will be in touch with your labs You have an ultrasound appt today on the ground floor imaging dept at 6:30 (you will have to register through the ER desk but you don't need to be seen in the ER)- however if you want to reschedule for another time in the next couple of days that is ok!  You do have atrial fibrilation- this is a common cardiac arrhythmia which can lead to swelling and possibly blood clots.   We will get you set up to see cardiology here at the [facility] asap!   We will also start you on 2 medications-  eliquis- blood thinner.  Take twice a day Metoprolol- to help slow down your heart rate and reduce swelling. Take once a day

## 2021-05-12 NOTE — Telephone Encounter (Signed)
Dr.Copland had a question in regards to patient afib- spoke with Dr.O'Neal.  No other questions at this time.

## 2021-05-13 ENCOUNTER — Other Ambulatory Visit: Payer: Self-pay

## 2021-05-13 DIAGNOSIS — R011 Cardiac murmur, unspecified: Secondary | ICD-10-CM

## 2021-05-13 LAB — BASIC METABOLIC PANEL
BUN: 14 mg/dL (ref 6–23)
CO2: 27 mEq/L (ref 19–32)
Calcium: 9.2 mg/dL (ref 8.4–10.5)
Chloride: 105 mEq/L (ref 96–112)
Creatinine, Ser: 1.01 mg/dL (ref 0.40–1.20)
GFR: 50.85 mL/min — ABNORMAL LOW (ref >60.00)
Glucose, Bld: 87 mg/dL (ref 70–99)
Potassium: 4.2 mEq/L (ref 3.5–5.1)
Sodium: 141 mEq/L (ref 135–145)

## 2021-05-13 LAB — CBC
HCT: 37.1 % (ref 36.0–46.0)
Hemoglobin: 12.4 g/dL (ref 12.0–15.0)
MCHC: 33.4 g/dL (ref 30.0–36.0)
MCV: 91.7 fl (ref 78.0–100.0)
Platelets: 179 10*3/uL (ref 150.0–400.0)
RBC: 4.05 Mil/uL (ref 3.87–5.11)
RDW: 14.9 % (ref 11.5–15.5)
WBC: 5.5 10*3/uL (ref 4.0–10.5)

## 2021-05-13 LAB — BRAIN NATRIURETIC PEPTIDE: Pro B Natriuretic peptide (BNP): 199 pg/mL — ABNORMAL HIGH (ref 0.0–100.0)

## 2021-05-13 LAB — TSH: TSH: 1.8 u[IU]/mL (ref 0.35–4.50)

## 2021-05-14 ENCOUNTER — Other Ambulatory Visit: Payer: Self-pay

## 2021-05-14 ENCOUNTER — Ambulatory Visit (HOSPITAL_BASED_OUTPATIENT_CLINIC_OR_DEPARTMENT_OTHER)
Admission: RE | Admit: 2021-05-14 | Discharge: 2021-05-14 | Disposition: A | Discharge status: Home / Self Care | Payer: Medicare Other | Source: Ambulatory Visit | Attending: Family Medicine | Admitting: Family Medicine

## 2021-05-14 DIAGNOSIS — S8012XA Contusion of left lower leg, initial encounter: Secondary | ICD-10-CM | POA: Diagnosis not present

## 2021-05-14 DIAGNOSIS — R600 Localized edema: Secondary | ICD-10-CM | POA: Diagnosis not present

## 2021-05-17 DIAGNOSIS — M25569 Pain in unspecified knee: Secondary | ICD-10-CM | POA: Insufficient documentation

## 2021-05-17 DIAGNOSIS — H269 Unspecified cataract: Secondary | ICD-10-CM | POA: Insufficient documentation

## 2021-05-21 ENCOUNTER — Telehealth: Payer: Self-pay | Admitting: Cardiology

## 2021-05-21 DIAGNOSIS — R600 Localized edema: Secondary | ICD-10-CM

## 2021-05-21 NOTE — Telephone Encounter (Signed)
Patient's daughter called in to cancel 06/09/21 echo appointment. She states the patient is unable to travel to [place] and it must be done in [place]. She is requesting to have the order updated with the [facility] location [facility]). Please assist.

## 2021-06-08 ENCOUNTER — Other Ambulatory Visit: Payer: Self-pay

## 2021-06-08 ENCOUNTER — Ambulatory Visit (HOSPITAL_COMMUNITY): Payer: Medicare Other | Attending: Cardiology

## 2021-06-08 DIAGNOSIS — R600 Localized edema: Secondary | ICD-10-CM

## 2021-06-08 LAB — ECHOCARDIOGRAM COMPLETE
Area-P 1/2: 3.83 cm2
S' Lateral: 2.3 cm

## 2021-06-09 ENCOUNTER — Other Ambulatory Visit: Payer: Medicare Other

## 2021-06-10 ENCOUNTER — Encounter: Payer: Self-pay | Admitting: Cardiology

## 2021-06-10 ENCOUNTER — Other Ambulatory Visit: Payer: Self-pay

## 2021-06-10 ENCOUNTER — Ambulatory Visit (INDEPENDENT_AMBULATORY_CARE_PROVIDER_SITE_OTHER): Payer: Medicare Other

## 2021-06-10 ENCOUNTER — Ambulatory Visit: Payer: Medicare Other | Admitting: Cardiology

## 2021-06-10 VITALS — BP 150/72 | HR 62 | Ht <= 58 in | Wt 143.1 lb

## 2021-06-10 DIAGNOSIS — E669 Obesity, unspecified: Secondary | ICD-10-CM | POA: Diagnosis not present

## 2021-06-10 DIAGNOSIS — I10 Essential (primary) hypertension: Secondary | ICD-10-CM | POA: Diagnosis not present

## 2021-06-10 DIAGNOSIS — I361 Nonrheumatic tricuspid (valve) insufficiency: Secondary | ICD-10-CM | POA: Diagnosis not present

## 2021-06-10 DIAGNOSIS — R600 Localized edema: Secondary | ICD-10-CM | POA: Diagnosis not present

## 2021-06-10 DIAGNOSIS — I480 Paroxysmal atrial fibrillation: Secondary | ICD-10-CM | POA: Diagnosis not present

## 2021-06-10 NOTE — Addendum Note (Signed)
Addended by: Orvan July on: 06/10/2021 11:06 AM   Modules accepted: Orders

## 2021-06-10 NOTE — Patient Instructions (Signed)
Medication Instructions:  Your physician recommends that you continue on your current medications as directed. Please refer to the Current Medication list given to you today.  *If you need a refill on your cardiac medications before your next appointment, please call your pharmacy*   Lab Work: None If you have labs (blood work) drawn today and your tests are completely normal, you will receive your results only by: [place] (if you have MyChart) OR A paper copy in the mail If you have any lab test that is abnormal or we need to change your treatment, we will call you to review the results.   Testing/Procedures: A zio monitor was ordered today. It will remain on for 14 days. You will then return monitor and event diary in provided box. It takes 1-2 weeks for report to be downloaded and returned to [place]. We will call you with the results. If monitor falls off or has orange flashing light, please call Zio for further instructions.     Follow-Up: At [facility], you and your health needs are our priority.  As part of our continuing mission to provide you with exceptional heart care, we have created designated Provider Care Teams.  These Care Teams include your primary Cardiologist (physician) and Advanced Practice Providers (APPs -  Physician Assistants and Nurse Practitioners) who all work together to provide you with the care you need, when you need it.  We recommend signing up for the patient portal called "MyChart".  Sign up information is provided on this After Visit Summary.  MyChart is used to connect with patients for Virtual Visits (Telemedicine).  Patients are able to view lab/test results, encounter notes, upcoming appointments, etc.  Non-urgent messages can be sent to your provider as well.   To learn more about what you can do with MyChart, go to [email or web address].    Your next appointment:   6 month(s)  The format for your next appointment:   In  Person    Other Instructions

## 2021-06-10 NOTE — Progress Notes (Addendum)
Cardiology Office Note:    Date:  06/10/2021   ID:  Kathy Thornton, DOB 09-22-1934, MRN [id number]  PCP:  Darreld Mclean, MD  Cardiologist:  None  Electrophysiologist:  None   Referring MD: Darreld Mclean, MD   " I am doing fine"  History of Present Illness:    Kathy Thornton is a 87 y.o. female with a hx of breast cancer status post lobectomy and radiation, recently diagnosed atrial fibrillation started on metoprolol and Eliquis mild to moderate tricuspid regurgitation which was seen on her recent echocardiogram, mild mitral regurgitation is here today to establish cardiac care.  The patient was noted to be in atrial fibrillation recently when she saw her PCP at which time she was started on Eliquis as well as metoprolol.  Today she tells me she has not had any symptoms or chest pain no shortness of breath no lightheadedness no dizziness.  She has not passed out either.  She is here today with her daughter.  Past Medical History:  Diagnosis Date   Breast cancer ([place]) 2019   Left Breast Cancer   Breast cancer in female [facility])    Left   [name] lesion, acute: Per EGD 11/06/2015 11/06/2015   Cataract    Erosive gastritis: per EGD 11/05/2105 11/06/2015   Family history of breast cancer    Family history of colon cancer    Heart murmur    Hemorrhoids; Grade 1 per colonoscopy 11/06/2015 11/06/2015   Knee pain     Past Surgical History:  Procedure Laterality Date   BREAST LUMPECTOMY Left 03/30/2018   BREAST LUMPECTOMY WITH RADIOACTIVE SEED LOCALIZATION Left 03/30/2018   Procedure: RADIOACTIVE SEED GUIDED LEFT BREAST LUMPECTOMY;  Surgeon: Fanny Skates, MD;  Location: [facility];  Service: General;  Laterality: Left;   COLONOSCOPY WITH PROPOFOL N/A 11/06/2015   Procedure: COLONOSCOPY WITH PROPOFOL;  Surgeon: Ladene Artist, MD;  Location: [facility];  Service: Endoscopy;  Laterality: N/A;   ESOPHAGOGASTRODUODENOSCOPY (EGD) WITH PROPOFOL N/A 11/06/2015   Procedure:  ESOPHAGOGASTRODUODENOSCOPY (EGD) WITH PROPOFOL;  Surgeon: Ladene Artist, MD;  Location: [facility];  Service: Endoscopy;  Laterality: N/A;   EYE SURGERY      Current Medications: Current Meds  Medication Sig   apixaban (ELIQUIS) 5 MG TABS tablet Take 1 tablet (5 mg total) by mouth 2 (two) times daily.   Ascorbic Acid (VITAMIN C) 500 MG CAPS Take 500 mg by mouth daily.   Cinnamon 500 MG capsule Take 1 capsule (500 mg total) by mouth daily.   metoprolol succinate (TOPROL-XL) 25 MG 24 hr tablet Take 1 tablet (25 mg total) by mouth daily.   Multiple Vitamins-Minerals (WOMENS MULTIVITAMIN PO) Take by mouth.   pantoprazole (PROTONIX) 40 MG tablet TAKE 1 TABLET(40 MG) BY MOUTH DAILY   traMADol (ULTRAM) 50 MG tablet TAKE 1 TABLET BY MOUTH EVERY 8 HOURS AS NEEDED FOR MODERATE PAIN   Trolamine Salicylate (ASPERCREME EX) Apply 1 application topically daily as needed (pain).   Turmeric 500 MG CAPS Take 1 capsule by mouth 1 day or 1 dose.   UNABLE TO FIND in the morning and at bedtime. Beet Vitamin   Vitamin D, Ergocalciferol, 50 MCG (2000 UT) CAPS Take 2,000 Units by mouth daily.     Allergies:   Sulfa antibiotics   Social History   Socioeconomic History   Marital status: Married    Spouse name: Not on file   Number of children: Not on file   Years of education:  Not on file   Highest education level: Not on file  Occupational History   Not on file  Tobacco Use   Smoking status: Never   Smokeless tobacco: Never  Vaping Use   Vaping Use: Never used  Substance and Sexual Activity   Alcohol use: No   Drug use: No   Sexual activity: Not on file  Other Topics Concern   Not on file  Social History Narrative   Not on file   Social Determinants of Health   Financial Resource Strain: Not on file  Food Insecurity: Not on file  Transportation Needs: Not on file  Physical Activity: Not on file  Stress: Not on file  Social Connections: Not on file     Family History: The patient's  family history includes AAA (abdominal aortic aneurysm) in her maternal uncle; Aneurysm in her mother; Breast cancer in her cousin; Colon cancer in her paternal uncle; Heart disease in her brother and father.  ROS:   Review of Systems  Constitution: Negative for decreased appetite, fever and weight gain.  HENT: Negative for congestion, ear discharge, hoarse voice and sore throat.   Eyes: Negative for discharge, redness, vision loss in right eye and visual halos.  Cardiovascular: Negative for chest pain, dyspnea on exertion, leg swelling, orthopnea and palpitations.  Respiratory: Negative for cough, hemoptysis, shortness of breath and snoring.   Endocrine: Negative for heat intolerance and polyphagia.  Hematologic/Lymphatic: Negative for bleeding problem. Does not bruise/bleed easily.  Skin: Negative for flushing, nail changes, rash and suspicious lesions.  Musculoskeletal: Negative for arthritis, joint pain, muscle cramps, myalgias, neck pain and stiffness.  Gastrointestinal: Negative for abdominal pain, bowel incontinence, diarrhea and excessive appetite.  Genitourinary: Negative for decreased libido, genital sores and incomplete emptying.  Neurological: Negative for brief paralysis, focal weakness, headaches and loss of balance.  Psychiatric/Behavioral: Negative for altered mental status, depression and suicidal ideas.  Allergic/Immunologic: Negative for HIV exposure and persistent infections.    EKGs/Labs/Other Studies Reviewed:    The following studies were reviewed today:   EKG:  The ekg ordered today demonstrates sinus rhythm, heart rate 60 bpm with underlying right bundle branch block compared to prior EKG which was done in June 2022 at that time the patient was in atrial fibrillation.  Transthoracic echocardiogram June 08, 2021 IMPRESSIONS     1. Left ventricular ejection fraction, by estimation, is 55 to 60%. The  left ventricle has normal function. The left ventricle has no  regional  wall motion abnormalities. Left ventricular diastolic parameters are  indeterminate. Elevated left ventricular  end-diastolic pressure. The average left ventricular global longitudinal  strain is -21.2 %. The global longitudinal strain is normal.   2. Right ventricular systolic function is normal. The right ventricular  size is normal. There is mildly elevated pulmonary artery systolic  pressure. The estimated right ventricular systolic pressure is 40.9 mmHg.   3. The mitral valve is normal in structure. Mild mitral valve  regurgitation. No evidence of mitral stenosis.   4. Tricuspid valve regurgitation is mild to moderate.   5. The aortic valve is normal in structure. Aortic valve regurgitation is  not visualized. No aortic stenosis is present.   6. The inferior vena cava is normal in size with greater than 50%  respiratory variability, suggesting right atrial pressure of 3 mmHg.   FINDINGS   Left Ventricle: Left ventricular ejection fraction, by estimation, is 55  to 60%. The left ventricle has normal function. The left ventricle has no  regional wall motion abnormalities. The average left ventricular global  longitudinal strain is -21.2 %.  The global longitudinal strain is normal. The left ventricular internal  cavity size was normal in size. There is no left ventricular hypertrophy.  Left ventricular diastolic parameters are indeterminate. Elevated left  ventricular end-diastolic pressure.   Right Ventricle: The right ventricular size is normal. No increase in  right ventricular wall thickness. Right ventricular systolic function is  normal. There is mildly elevated pulmonary artery systolic pressure. The  tricuspid regurgitant velocity is 3.05   m/s, and with an assumed right atrial pressure of 3 mmHg, the estimated  right ventricular systolic pressure is 79.8 mmHg.   Left Atrium: Left atrial size was normal in size.   Right Atrium: Right atrial size was normal in  size.   Pericardium: There is no evidence of pericardial effusion.   Mitral Valve: The mitral valve is normal in structure. Mild mitral valve  regurgitation. No evidence of mitral valve stenosis.   Tricuspid Valve: The tricuspid valve is normal in structure. Tricuspid  valve regurgitation is mild to moderate. No evidence of tricuspid  stenosis.   Aortic Valve: The aortic valve is normal in structure. Aortic valve  regurgitation is not visualized. No aortic stenosis is present.   Pulmonic Valve: The pulmonic valve was normal in structure. Pulmonic valve  regurgitation is trivial. No evidence of pulmonic stenosis.   Aorta: The aortic root is normal in size and structure.   Venous: The inferior vena cava is normal in size with greater than 50%  respiratory variability, suggesting right atrial pressure of 3 mmHg.   IAS/Shunts: No atrial level shunt detected by color flow Doppler.      Recent Labs: 11/18/2020: ALT 10 05/12/2021: BUN 14; Creatinine, Ser 1.01; Hemoglobin 12.4; Platelets 179.0; Potassium 4.2; Pro B Natriuretic peptide (BNP) 199.0; Sodium 141; TSH 1.80  Recent Lipid Panel    Component Value Date/Time   CHOL 165 11/11/2020 1346   TRIG 63.0 11/11/2020 1346   HDL 70.70 11/11/2020 1346   CHOLHDL 2 11/11/2020 1346   VLDL 12.6 11/11/2020 1346   LDLCALC 82 11/11/2020 1346    Physical Exam:    VS:  BP (!) 150/72 (BP Location: Left Arm, Patient Position: Sitting, Cuff Size: Normal)   Pulse 62   Ht 4\' 9"  (1.448 m)   Wt 143 lb 1.3 oz (64.9 kg)   SpO2 98%   BMI 30.96 kg/m     Wt Readings from Last 3 Encounters:  06/10/21 143 lb 1.3 oz (64.9 kg)  05/12/21 144 lb 6.4 oz (65.5 kg)  11/11/20 143 lb (64.9 kg)     GEN: Well nourished, well developed in no acute distress HEENT: Normal NECK: No JVD; No carotid bruits LYMPHATICS: No lymphadenopathy CARDIAC: S1S2 noted,RRR, no murmurs, rubs, gallops RESPIRATORY:  Clear to auscultation without rales, wheezing or rhonchi   ABDOMEN: Soft, non-tender, non-distended, +bowel sounds, no guarding. EXTREMITIES: +1 trace bilateral leg edema edema, No cyanosis, no clubbing MUSCULOSKELETAL:  No deformity  SKIN: Warm and dry NEUROLOGIC:  Alert and oriented x 3, non-focal PSYCHIATRIC:  Normal affect, good insight  ASSESSMENT:    1. PAF (paroxysmal atrial fibrillation) ([place])   2. Hypertension, unspecified type   3. Obesity (BMI 30-39.9)   4. Nonrheumatic tricuspid valve regurgitation    PLAN:     She is in sinus rhythm today, and denies any symptoms of shortness of breath or any chest pain.  She is on metoprolol we will continue  this medication.  Her CHA2DS2-VASc score is 4 indicating anticoagulation for stroke prevention.  She has already been started on apixaban 5 mg twice a day by her primary care provider.  We will continue this.  We will also give the patient the Eliquis assistance form as she is interested in this program.  I have educated patient on the side effects of this medication. I also urge the patient to abstain from any taking behaviors.  The patient understands that she is now at a high risk of bleeding due to being on anticoagulation.  She was also advised that if she ever falls and especially hit her head to be seen in the emergency department.  We will also place a monitor on the patient to understand her A. fib burden and make recommendations there for any medication adjustments.  Her blood pressure is slightly elevated in the office today.  For the patient tells me at home she takes her blood pressure is usually in the 130s to 120s.  I have asked the patient to take her blood pressure daily and share this information with my office.  For now I will not increase any antihypertensive medication to avoid any medication induced hypotension.  No signs of right heart Failure we will continue   The patient is in agreement with the above plan. The patient left the office in stable condition.  The patient  will follow up in 6 months or sooner if needed.   Medication Adjustments/Labs and Tests Ordered: Current medicines are reviewed at length with the patient today.  Concerns regarding medicines are outlined above.  Orders Placed This Encounter  Procedures   EKG 12-Lead    No orders of the defined types were placed in this encounter.   There are no Patient Instructions on file for this visit.   Adopting a Healthy Lifestyle.  Know what a healthy weight is for you (roughly BMI <25) and aim to maintain this   Aim for 7+ servings of fruits and vegetables daily   65-80+ fluid ounces of water or unsweet tea for healthy kidneys   Limit to max 1 drink of alcohol per day; avoid smoking/tobacco   Limit animal fats in diet for cholesterol and heart health - choose grass fed whenever available   Avoid highly processed foods, and foods high in saturated/trans fats   Aim for low stress - take time to unwind and care for your mental health   Aim for 150 min of moderate intensity exercise weekly for heart health, and weights twice weekly for bone health   Aim for 7-9 hours of sleep daily   When it comes to diets, agreement about the perfect plan isnt easy to find, even among the experts. Experts at the [employer] developed an idea known as the Healthy Eating Plate. Just imagine a plate divided into logical, healthy portions.   The emphasis is on diet quality:   Load up on vegetables and fruits - one-half of your plate: Aim for color and variety, and remember that potatoes dont count.   Go for whole grains - one-quarter of your plate: Whole wheat, barley, wheat berries, quinoa, oats, brown rice, and foods made with them. If you want pasta, go with whole wheat pasta.   Protein power - one-quarter of your plate: Fish, chicken, beans, and nuts are all healthy, versatile protein sources. Limit red meat.   The diet, however, does go beyond the plate, offering a few other  suggestions.  Use healthy plant oils, such as olive, canola, soy, corn, sunflower and peanut. Check the labels, and avoid partially hydrogenated oil, which have unhealthy trans fats.   If youre thirsty, drink water. Coffee and tea are good in moderation, but skip sugary drinks and limit milk and dairy products to one or two daily servings.   The type of carbohydrate in the diet is more important than the amount. Some sources of carbohydrates, such as vegetables, fruits, whole grains, and beans-are healthier than others.   Finally, stay active  Signed, Berniece Salines, DO  06/10/2021 10:45 AM    [place]

## 2021-06-18 DIAGNOSIS — I480 Paroxysmal atrial fibrillation: Secondary | ICD-10-CM

## 2021-06-23 DIAGNOSIS — I480 Paroxysmal atrial fibrillation: Secondary | ICD-10-CM | POA: Diagnosis not present

## 2021-07-07 DIAGNOSIS — M1611 Unilateral primary osteoarthritis, right hip: Secondary | ICD-10-CM | POA: Diagnosis not present

## 2021-07-21 ENCOUNTER — Other Ambulatory Visit: Payer: Self-pay | Admitting: Oncology

## 2021-07-21 DIAGNOSIS — Z9889 Other specified postprocedural states: Secondary | ICD-10-CM

## 2021-09-08 ENCOUNTER — Other Ambulatory Visit: Payer: Self-pay

## 2021-09-08 ENCOUNTER — Ambulatory Visit
Admission: RE | Admit: 2021-09-08 | Discharge: 2021-09-08 | Disposition: A | Discharge status: Home / Self Care | Payer: Medicare Other | Source: Ambulatory Visit | Attending: Oncology | Admitting: Oncology

## 2021-09-08 DIAGNOSIS — Z9889 Other specified postprocedural states: Secondary | ICD-10-CM

## 2021-09-08 DIAGNOSIS — Z1231 Encounter for screening mammogram for malignant neoplasm of breast: Secondary | ICD-10-CM | POA: Diagnosis not present

## 2021-09-13 ENCOUNTER — Other Ambulatory Visit: Payer: Self-pay | Admitting: Family Medicine

## 2021-09-13 DIAGNOSIS — I4891 Unspecified atrial fibrillation: Secondary | ICD-10-CM

## 2021-09-14 ENCOUNTER — Other Ambulatory Visit: Payer: Self-pay | Admitting: *Deleted

## 2021-09-14 DIAGNOSIS — Z170 Estrogen receptor positive status [ER+]: Secondary | ICD-10-CM

## 2021-09-14 DIAGNOSIS — C50212 Malignant neoplasm of upper-inner quadrant of left female breast: Secondary | ICD-10-CM

## 2021-09-15 ENCOUNTER — Other Ambulatory Visit: Payer: Self-pay | Admitting: Oncology

## 2021-09-15 ENCOUNTER — Inpatient Hospital Stay: Payer: Medicare Other | Attending: Oncology

## 2021-09-15 ENCOUNTER — Other Ambulatory Visit: Payer: Self-pay

## 2021-09-15 ENCOUNTER — Inpatient Hospital Stay: Payer: Medicare Other

## 2021-09-15 ENCOUNTER — Inpatient Hospital Stay: Payer: Medicare Other | Admitting: Oncology

## 2021-09-15 VITALS — BP 144/52 | HR 62 | Temp 98.1°F | Resp 18 | Ht <= 58 in | Wt 145.9 lb

## 2021-09-15 DIAGNOSIS — Z8719 Personal history of other diseases of the digestive system: Secondary | ICD-10-CM | POA: Insufficient documentation

## 2021-09-15 DIAGNOSIS — Z170 Estrogen receptor positive status [ER+]: Secondary | ICD-10-CM | POA: Diagnosis not present

## 2021-09-15 DIAGNOSIS — Z79899 Other long term (current) drug therapy: Secondary | ICD-10-CM | POA: Diagnosis not present

## 2021-09-15 DIAGNOSIS — Z803 Family history of malignant neoplasm of breast: Secondary | ICD-10-CM | POA: Diagnosis not present

## 2021-09-15 DIAGNOSIS — Z7901 Long term (current) use of anticoagulants: Secondary | ICD-10-CM | POA: Diagnosis not present

## 2021-09-15 DIAGNOSIS — Z23 Encounter for immunization: Secondary | ICD-10-CM | POA: Insufficient documentation

## 2021-09-15 DIAGNOSIS — M810 Age-related osteoporosis without current pathological fracture: Secondary | ICD-10-CM | POA: Insufficient documentation

## 2021-09-15 DIAGNOSIS — Z800 Family history of malignant neoplasm of digestive organs: Secondary | ICD-10-CM | POA: Insufficient documentation

## 2021-09-15 DIAGNOSIS — Z882 Allergy status to sulfonamides status: Secondary | ICD-10-CM | POA: Insufficient documentation

## 2021-09-15 DIAGNOSIS — C50212 Malignant neoplasm of upper-inner quadrant of left female breast: Secondary | ICD-10-CM | POA: Diagnosis not present

## 2021-09-15 DIAGNOSIS — Z8249 Family history of ischemic heart disease and other diseases of the circulatory system: Secondary | ICD-10-CM | POA: Diagnosis not present

## 2021-09-15 LAB — CMP (CANCER CENTER ONLY)
ALT: 13 U/L (ref 0–44)
AST: 17 U/L (ref 15–41)
Albumin: 4.3 g/dL (ref 3.5–5.0)
Alkaline Phosphatase: 84 U/L (ref 38–126)
Anion gap: 5 (ref 5–15)
BUN: 12 mg/dL (ref 8–23)
CO2: 29 mmol/L (ref 22–32)
Calcium: 9.1 mg/dL (ref 8.9–10.3)
Chloride: 105 mmol/L (ref 98–111)
Creatinine: 0.97 mg/dL (ref 0.44–1.00)
GFR, Estimated: 57 mL/min — ABNORMAL LOW (ref >60)
Glucose, Bld: 119 mg/dL — ABNORMAL HIGH (ref 70–99)
Potassium: 3.8 mmol/L (ref 3.5–5.1)
Sodium: 139 mmol/L (ref 135–145)
Total Bilirubin: 0.7 mg/dL (ref 0.3–1.2)
Total Protein: 7.2 g/dL (ref 6.5–8.1)

## 2021-09-15 LAB — CBC WITH DIFFERENTIAL (CANCER CENTER ONLY)
Abs Immature Granulocytes: 0 10*3/uL (ref 0.00–0.07)
Basophils Absolute: 0 10*3/uL (ref 0.0–0.1)
Basophils Relative: 1 %
Eosinophils Absolute: 0.2 10*3/uL (ref 0.0–0.5)
Eosinophils Relative: 3 %
HCT: 37.2 % (ref 36.0–46.0)
Hemoglobin: 12.2 g/dL (ref 12.0–15.0)
Immature Granulocytes: 0 %
Lymphocytes Relative: 29 %
Lymphs Abs: 1.7 10*3/uL (ref 0.7–4.0)
MCH: 30.7 pg (ref 26.0–34.0)
MCHC: 32.8 g/dL (ref 30.0–36.0)
MCV: 93.7 fL (ref 80.0–100.0)
Monocytes Absolute: 0.4 10*3/uL (ref 0.1–1.0)
Monocytes Relative: 6 %
Neutro Abs: 3.7 10*3/uL (ref 1.7–7.7)
Neutrophils Relative %: 61 %
Platelet Count: 184 10*3/uL (ref 150–400)
RBC: 3.97 MIL/uL (ref 3.87–5.11)
RDW: 14.4 % (ref 11.5–15.5)
WBC Count: 5.9 10*3/uL (ref 4.0–10.5)
nRBC: 0 % (ref 0.0–0.2)

## 2021-09-15 MED ORDER — INFLUENZA VAC A&B SA ADJ QUAD 0.5 ML IM PRSY
0.5000 mL | PREFILLED_SYRINGE | Freq: Once | INTRAMUSCULAR | Status: AC
  Administered 2021-09-15: 0.5 mL via INTRAMUSCULAR
  Filled 2021-09-15: qty 0.5

## 2021-09-15 NOTE — Progress Notes (Signed)
[place]  [phone] Fax:(336) [phone]     ID: Kathy Thornton DOB: [birth year]  MR#: [id number]  PZW#:[id number]  Patient Care Team: Darreld Mclean, MD as PCP - General (Family Medicine) Earlie Server, MD as Consulting Physician (Orthopedic Surgery) Gatha Mayer, MD as Consulting Physician (Gastroenterology) Elon Lomeli, Virgie Dad, MD as Consulting Physician (Oncology) Fanny Skates, MD as Consulting Physician (General Surgery) OTHER MD:  CHIEF COMPLAINT: Estrogen receptor positive breast cancer  CURRENT TREATMENT: Observation   INTERVAL HISTORY: Kathy Thornton returns today for follow-up of her estrogen receptor positive breast cancer. She is now under observation after not tolerating antiestrogens.  She is accompanied by one of her daughters  Since her last visit, she underwent bilateral screening mammography with tomography at [facility] on 09/08/2021 showing: breast density category B; no evidence of malignancy in either breast.   Her most recent bone density screening from 12/26/2019 showed a T-score of -3.4, which is considered osteoporotic. This is significantly worse than prior of -2.5 in 10/2017.   REVIEW OF SYSTEMS: Say[place] was diagnosed with atrial fibrillation since her last visit here.  She was started on metoprolol and Eliquis.  She says that her heart goes in and out rhythm but she does not have significant issues with that.  She tells me she vacuumed yesterday and she keeps a normal functional status for her.  She takes tramadol for her significant arthritis pain.  She does not get constipated from that.  A detailed review of systems was otherwise stable   COVID 19 VACCINATION STATUS: [place] x2, most recently 07/2020   HISTORY OF CURRENT ILLNESS:  From the original intake note:  BERDINE Thornton had routine screening mammography at Dr. Arlyn Dunning on 10/30/2017 showing a possible abnormality in the left breast. She underwent bilateral  diagnostic mammography with tomography and left breast ultrasonography at [facility] on 12/19/2017 showing:  a highly suspicious mass in the left breast at 11 o'clock. No evidence of left axillary lymphadenopathy. Ultrasound targeted to the left breast at 11 o'clock, 5 cm from the nipple demonstrates an ill-defined irregular vascular mass with spiculated margins measuring 8 x 6 x 6 mm. Ultrasound of the left axilla demonstrates multiple normal-appearing lymph nodes.  Accordingly on 12/22/2017 she proceeded to biopsy of the left breast area in question. 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 Prognostic indicators significant for: ER, 90% positive and PR, 80% positive, both with strong staining intensity. Proliferation marker Ki67 at 3%. HER2 negative via [place].  The patient's subsequent history is as detailed below.   PAST MEDICAL HISTORY: Past Medical History:  Diagnosis Date   Breast cancer ([place]) 2019   Left Breast Cancer   Breast cancer in female [facility])    Left   Kathy Thornton lesion, acute: Per EGD 11/06/2015 11/06/2015   Cataract    Erosive gastritis: per EGD 11/05/2105 11/06/2015   Family history of breast cancer    Family history of colon cancer    Heart murmur    Hemorrhoids; Grade 1 per colonoscopy 11/06/2015 11/06/2015   Knee pain     PAST SURGICAL HISTORY: Past Surgical History:  Procedure Laterality Date   BREAST LUMPECTOMY Left 03/30/2018   BREAST LUMPECTOMY WITH RADIOACTIVE SEED LOCALIZATION Left 03/30/2018   Procedure: RADIOACTIVE SEED GUIDED LEFT BREAST LUMPECTOMY;  Surgeon: Fanny Skates, MD;  Location: [place];  Service: General;  Laterality:  Left;   COLONOSCOPY WITH PROPOFOL N/A 11/06/2015   Procedure: COLONOSCOPY WITH PROPOFOL;  Surgeon: Ladene Artist, MD;  Location[facility];  Service: Endoscopy;   Laterality: N/A;   ESOPHAGOGASTRODUODENOSCOPY (EGD) WITH PROPOFOL N/A 11/06/2015   Procedure: ESOPHAGOGASTRODUODENOSCOPY (EGD) WITH PROPOFOL;  Surgeon: Ladene Artist, MD;  [facility];  Service: Endoscopy;  Laterality: N/A;   EYE SURGERY      FAMILY HISTORY Family History  Problem Relation Age of Onset   Aneurysm Mother        brain   Heart disease Father    Heart disease Brother    AAA (abdominal aortic aneurysm) Maternal Uncle    Colon cancer Paternal Uncle        dx >50   Breast cancer Cousin        pat cousin, dx in her late 28s  Her mother had a brain aneurysm at age 85 that she passed from. Her father lived with cardiac issues and passed at age 85. She has 2 brothers and 3 sisters. She had one paternal cousin that had breast cancer at age 67. One uncle had a prior hx of colon cancer. She denies any family hx of ovarian cancer.    GYNECOLOGIC HISTORY:  No LMP recorded. Patient is postmenopausal. Menarche: 87 years old Age at first live birth: 87 years old [place] P: [id number] LMP: 87 years old Contraceptive:No HRT: No    SOCIAL HISTORY:  She retired from [employer], [employer], [employer], and [employer] at age 29. She will have been married 38 years as of October 2022. Her husband's name is Elenore Rota and he is a Horticulturist, commercial. She lives at home with her husband. She has no pets. She keeps her granddaughter when the girl is not in school. She has 36 grandchildren and great grandchildren combined. She is a Psychologist, forensic.     ADVANCED DIRECTIVES: She is comfortable with her husband being her healthcare power of attorney   HEALTH MAINTENANCE: Social History   Tobacco Use   Smoking status: Never   Smokeless tobacco: Never  Vaping Use   Vaping Use: Never used  Substance Use Topics   Alcohol use: No   Drug use: No     Colonoscopy: 11/05/2015/[facility]  PAP: Last one >10 years ago per patient   Bone density: 12/26/2019, -3.4   Allergies  Allergen Reactions   Sulfa  Antibiotics Hives    Current Outpatient Medications  Medication Sig Dispense Refill   apixaban (ELIQUIS) 5 MG TABS tablet Take 1 tablet (5 mg total) by mouth 2 (two) times daily. 60 tablet 3   Ascorbic Acid (VITAMIN C) 500 MG CAPS Take 500 mg by mouth daily.     Cinnamon 500 MG capsule Take 1 capsule (500 mg total) by mouth daily.     methocarbamol (ROBAXIN) 500 MG tablet Take 1 tablet (500 mg total) by mouth every 8 (eight) hours as needed for muscle spasms. (Patient not taking: Reported on 06/10/2021) 8 tablet 0   metoprolol succinate (TOPROL-XL) 25 MG 24 hr tablet TAKE 1 TABLET(25 MG) BY MOUTH DAILY 30 tablet 3   Multiple Vitamins-Minerals (WOMENS MULTIVITAMIN PO) Take by mouth.     pantoprazole (PROTONIX) 40 MG tablet TAKE 1 TABLET(40 MG) BY MOUTH DAILY 90 tablet 1   traMADol (ULTRAM) 50 MG tablet TAKE 1 TABLET BY MOUTH EVERY 8 HOURS AS NEEDED FOR MODERATE PAIN 90 tablet 3   Trolamine Salicylate (ASPERCREME EX) Apply 1 application topically daily as needed (  pain).     Turmeric 500 MG CAPS Take 1 capsule by mouth 1 day or 1 dose.     UNABLE TO FIND in the morning and at bedtime. Beet Vitamin     Vitamin D, Ergocalciferol, 50 MCG (2000 UT) CAPS Take 2,000 Units by mouth daily.     No current facility-administered medications for this visit.    OBJECTIVE: white woman using a cane  Vitals:   09/15/21 1231  BP: (!) 144/52  Pulse: 62  Resp: 18  Temp: 98.1 F (36.7 C)  SpO2: 100%      Body mass index is 31.57 kg/m.   Wt Readings from Last 3 Encounters:  09/15/21 145 lb 14.4 oz (66.2 kg)  06/10/21 143 lb 1.3 oz (64.9 kg)  05/12/21 144 lb 6.4 oz (65.5 kg)     ECOG FS:2 - Symptomatic, <50% confined to bed  Sclerae unicteric, EOMs intact Wearing a mask No cervical or supraclavicular adenopathy Lungs no rales or rhonchi Heart regular rate and rhythm Abd soft, nontender, positive bowel sounds MSK no focal spinal tenderness, no upper extremity lymphedema Neuro: nonfocal, well  oriented, appropriate affect Breasts: The right breast is unremarkable.  The left breast is status post lumpectomy.  There is no evidence of local recurrence.  Both axillae are benign   LAB RESULTS:  CMP     Component Value Date/Time   NA 141 05/12/2021 1404   K 4.2 05/12/2021 1404   CL 105 05/12/2021 1404   CO2 27 05/12/2021 1404   GLUCOSE 87 05/12/2021 1404   BUN 14 05/12/2021 1404   CREATININE 1.01 05/12/2021 1404   CREATININE 1.03 03/14/2018 1441   CREATININE 1.11 (H) 01/19/2016 1126   CALCIUM 9.2 05/12/2021 1404   PROT 6.2 (L) 11/18/2020 0622   ALBUMIN 3.6 11/18/2020 0622   AST 14 (L) 11/18/2020 0622   AST 13 03/14/2018 1441   ALT 10 11/18/2020 0622   ALT 10 03/14/2018 1441   ALKPHOS 70 11/18/2020 0622   BILITOT 0.8 11/18/2020 0622   BILITOT 0.4 03/14/2018 1441   GFRNONAA >60 11/18/2020 0622   GFRNONAA 49 (L) 03/14/2018 1441   GFRAA 47 (L) 09/02/2020 1237   GFRAA 57 (L) 03/14/2018 1441    No results found for: TOTALPROTELP, ALBUMINELP, A1GS, A2GS, BETS, BETA2SER, GAMS, MSPIKE, SPEI  No results found for: KPAFRELGTCHN, LAMBDASER, KAPLAMBRATIO  Lab Results  Component Value Date   WBC 5.9 09/15/2021   NEUTROABS 3.7 09/15/2021   HGB 12.2 09/15/2021   HCT 37.2 09/15/2021   MCV 93.7 09/15/2021   PLT 184 09/15/2021   No results found for: LABCA2  No components found for: GTZREL466  No results for input(s): INR in the last 168 hours.  No results found for: LABCA2  No results found for: RFF634  No results found for: ATN848  No results found for: RWY549  No results found for: CA2729  No components found for: HGQUANT  No results found for: CEA1 / No results found for: CEA1   No results found for: AFPTUMOR  No results found for: CHROMOGRNA  No results found for: HGBA, HGBA2QUANT, HGBFQUANT, HGBSQUAN (Hemoglobinopathy evaluation)   No results found for: LDH  Lab Results  Component Value Date   IRON 14 (L) 11/05/2015   TIBC 545 (H) 11/05/2015    IRONPCTSAT 3 (L) 11/05/2015   (Iron and TIBC)  Lab Results  Component Value Date   FERRITIN 3 (L) 11/05/2015    Urinalysis    Component Value Date/Time  COLORURINE YELLOW 10/13/2019 [place] 10/13/2019 1556   LABSPEC 1.015 10/13/2019 1556   PHURINE 5.0 10/13/2019 1556   GLUCOSEU NEGATIVE 10/13/2019 1556   HGBUR SMALL (A) 10/13/2019 1556   BILIRUBINUR NEGATIVE 10/13/2019 1556   BILIRUBINUR negative 11/04/2015 1418   KETONESUR 20 (A) 10/13/2019 1556   PROTEINUR NEGATIVE 10/13/2019 1556   UROBILINOGEN 0.2 11/04/2015 1418   NITRITE NEGATIVE 10/13/2019 1556   LEUKOCYTESUR SMALL (A) 10/13/2019 1556    STUDIES: MM 3D SCREEN BREAST BILATERAL  Result Date: 09/12/2021 CLINICAL DATA:  Screening. EXAM: DIGITAL SCREENING BILATERAL MAMMOGRAM WITH TOMOSYNTHESIS AND CAD TECHNIQUE: Bilateral screening digital craniocaudal and mediolateral oblique mammograms were obtained. Bilateral screening digital breast tomosynthesis was performed. The images were evaluated with computer-aided detection. COMPARISON:  Previous exam(s). ACR Breast Density Category b: There are scattered areas of fibroglandular density. FINDINGS: There are no findings suspicious for malignancy. IMPRESSION: No mammographic evidence of malignancy. A result letter of this screening mammogram will be mailed directly to the patient. RECOMMENDATION: Screening mammogram in one year. (Code:SM-B-01Y) BI-RADS CATEGORY  1: Negative. Electronically Signed   By: Ammie Ferrier M.D.   On: 09/12/2021 09:14      ELIGIBLE FOR AVAILABLE RESEARCH PROTOCOL:no  ASSESSMENT: 87 y.o. [place] woman status post left breast upper inner quadrant biopsy 12/22/2017 for a clinical T1b N0, stage IA invasive ductal carcinoma, grade 1, estrogen and progesterone receptor positive, HER-2 not amplified, with an MIB-1 of 3%.  (1) genetics testing 03/23/2018 through the hereditary Gene Panel offered by [facility] found no deleterious mutations in  APC, ATM, AXIN2, BARD1, BMPR1A, BRCA1, BRCA2, BRIP1, CDH1, CDK4, CDKN2A (p14ARF), CDKN2A (p16INK4a), CHEK2, CTNNA1, DICER1, EPCAM (Deletion/duplication testing only), GREM1 (promoter region deletion/duplication testing only), KIT, MEN1, MLH1, MSH2, MSH3, MSH6, MUTYH, NBN, NF1, NHTL1, PALB2, PDGFRA, PMS2, POLD1, POLE, PTEN, RAD50, RAD51C, RAD51D, SDHB, SDHC, SDHD, SMAD4, SMARCA4. STK11, TP53, TSC1, TSC2, and VHL.  The following genes were evaluated for sequence changes only: SDHA and HOXB13 c.251G>A variant only.   (a) APC c.7468G>A (p.Asp2490Asn) VUS identified   (2) patient initially refused surgery or radiation, but underwent left lumpectomy 03/30/2018 for a pT1b cN0, stage IA invasive ductal carcinoma, grade 1, with negative margins  (3) anastrozole started 01/02/2018  (a) bone density 10/30/2017 shows a T score of -2.5  (b) alendronate prescribed JAN 2019 but never started by patient  (c) anastrozole discontinued April 2021 with multiple side effects   PLAN: Kathy Thornton is now 3-1/2 years out from definitive surgery for her breast cancer with no evidence of disease recurrence.  This is very favorable.  We discussed her atrial fibrillation.  I suggested she discontinue turmeric which she is essentially like on aspirin and can call us bruising and worsening the risk of bleeding.  She is amenable to that.  She has significant osteoporosis but has not wanted to take pharmacologic agents to improve that.  She is on vitamin D supplementation.  She requested obtaining a flu shot today and we were glad to facilitate that.  She will see [place] again in 1 year.  That likely will be her "graduation visit".  Total encounter time 25 minutes.*   Aanya Haynes, Virgie Dad, MD  09/15/21 12:36 PM Medical Oncology and Hematology [facility] [place], [place] [address] Tel. [phone]    Fax. [phone]    I, Wilburn Mylar, am acting as scribe for Dr. Virgie Dad. Annarose Ouellet.  Lindie Spruce MD, have reviewed the above documentation for accuracy and  completeness, and I agree with the above.   *Total Encounter Time as defined by the Centers for Medicare and Medicaid Services includes, in addition to the face-to-face time of a patient visit (documented in the note above) non-face-to-face time: obtaining and reviewing outside history, ordering and reviewing medications, tests or procedures, care coordination (communications with other health care professionals or caregivers) and documentation in the medical record.

## 2021-09-24 ENCOUNTER — Other Ambulatory Visit: Payer: Self-pay | Admitting: Family Medicine

## 2021-09-24 ENCOUNTER — Telehealth: Payer: Self-pay | Admitting: Cardiology

## 2021-09-24 DIAGNOSIS — I4891 Unspecified atrial fibrillation: Secondary | ICD-10-CM

## 2021-09-24 DIAGNOSIS — M170 Bilateral primary osteoarthritis of knee: Secondary | ICD-10-CM

## 2021-09-24 NOTE — Telephone Encounter (Signed)
*  STAT* If patient is at the pharmacy, call can be transferred to refill team.   1. Which medications need to be refilled? (please list name of each medication and dose if known)  apixaban (ELIQUIS) 5 MG TABS tablet  2. Which pharmacy/location (including street and city if local pharmacy) is medication to be sent to? [facility], [place] - [address] AT [place]  3. Do they need a 30 day or 90 day supply? 74 with refills   Patient's daughter said the patient needs to have her Cardiologist fill this now not her PCP

## 2021-09-27 MED ORDER — APIXABAN 5 MG PO TABS: 5.0000 mg | ORAL_TABLET | Freq: Two times a day (BID) | ORAL | 5 refills | Status: DC

## 2021-09-27 NOTE — Telephone Encounter (Signed)
Prescription refill request for Eliquis received.  Indication: afib  Last office visit:06/10/2021 Scr: 0.97, 09/15/2021 Age: 87 yo  Weight: 66.2 kg   Refill sent.

## 2021-09-28 ENCOUNTER — Encounter: Payer: Self-pay | Admitting: Genetic Counselor

## 2021-10-11 ENCOUNTER — Telehealth: Payer: Self-pay | Admitting: Family Medicine

## 2021-10-11 NOTE — Telephone Encounter (Signed)
Left message for patient to call back and schedule Medicare Annual Wellness Visit (AWV) in office.   If not able to come in office, please offer to do virtually or by telephone.  Left office number and my jabber [phone].  Last AWV:11/08/2018  Please schedule at anytime with Nurse Health Advisor.

## 2021-11-03 ENCOUNTER — Other Ambulatory Visit: Payer: Self-pay | Admitting: Family Medicine

## 2021-11-03 DIAGNOSIS — Z8711 Personal history of peptic ulcer disease: Secondary | ICD-10-CM

## 2021-11-12 NOTE — Patient Instructions (Incomplete)
Good to see you again today- assuming all is well please see me in 6 months Consider getting the shingles vaccine at your pharmacy at your convenience

## 2021-11-12 NOTE — Progress Notes (Deleted)
[facility] [address], [place], [place] [address] [phone]  Date:  11/17/2021   Name:  Kathy Thornton   DOB:  1934/06/02   MRN:  [id number]  PCP:  Darreld Mclean, MD    Chief Complaint: No chief complaint on file.   History of Present Illness:  Kathy Thornton is a 87 y.o. very pleasant female patient who presents with the following:  Pt seen today for a periodic follow-up visit. History of breast cancer 2018, chronic arthritis pain, osteoporosis, chronic renal insufficiency She had an ulcer in 2018 or 2019 due to NSAID use, we now use tramadol for her knee pain Last seen by myself in June- at that time she happened to be in A fib as well- new dx  Started on Eliquis and toprol xl   Last cardiology visit in July- Dr Harriet Masson She is in sinus rhythm today, and denies any symptoms of shortness of breath or any chest pain.  She is on metoprolol we will continue this medication.  Her CHA2DS2-VASc score is 4 indicating anticoagulation for stroke prevention.  She has already been started on apixaban 5 mg twice a day by her primary care provider.  We will continue this.  We will also give the patient the Eliquis assistance form as she is interested in this program.   I have educated patient on the side effects of this medication. I also urge the patient to abstain from any taking behaviors.  The patient understands that she is now at a high risk of bleeding due to being on anticoagulation.  She was also advised that if she ever falls and especially hit her head to be seen in the emergency department.    Seen by her oncologist- Dr Jana Hakim- in October.  Stable from breast cancer s/p definitive surgical treatment Plan to visit in one year for her "graduation visit"  Covid booster Shingrix  Labs done in October  Patient Active Problem List   Diagnosis Date Noted   Knee pain 05/17/2021   Cataract 05/17/2021   Genetic  testing 03/27/2018   Family history of colon cancer    Family history of breast cancer    Malignant neoplasm of upper-inner quadrant of left breast in female, estrogen receptor positive ([place]) 01/02/2018   Osteoporosis 01/02/2018   [place]on lesion, acute: Per EGD 11/06/2015 11/06/2015   Erosive gastritis: per EGD 11/05/2105 11/06/2015   Hemorrhoids; Grade 1 per colonoscopy 11/06/2015 11/06/2015   Anemia, iron deficiency    Acute gastric ulcer    Anemia 11/05/2015   Symptomatic anemia 11/05/2015   Heart murmur 11/05/2015   Lower urinary tract infectious disease 11/05/2015   Lower extremity edema 11/05/2015   Arthritis of both knees 11/04/2015    Past Medical History:  Diagnosis Date   Breast cancer ([place]) 2019   Left Breast Cancer   Breast cancer in female [facility])    Left   Cameron lesion, acute: Per EGD 11/06/2015 11/06/2015   Cataract    Erosive gastritis: per EGD 11/05/2105 11/06/2015   Family history of breast cancer    Family history of colon cancer    Heart murmur    Hemorrhoids; Grade 1 per colonoscopy 11/06/2015 11/06/2015   Knee pain     Past Surgical History:  Procedure Laterality Date   BREAST LUMPECTOMY Left 03/30/2018   BREAST LUMPECTOMY WITH RADIOACTIVE SEED LOCALIZATION Left 03/30/2018   Procedure: RADIOACTIVE SEED GUIDED LEFT BREAST  LUMPECTOMY;  Surgeon: Fanny Skates, MD;  Location: [place];  Service: General;  Laterality: Left;   COLONOSCOPY WITH PROPOFOL N/A 11/06/2015   Procedure: COLONOSCOPY WITH PROPOFOL;  Surgeon: Ladene Artist, MD;  [facility];  Service: Endoscopy;  Laterality: N/A;   ESOPHAGOGASTRODUODENOSCOPY (EGD) WITH PROPOFOL N/A 11/06/2015   Procedure: ESOPHAGOGASTRODUODENOSCOPY (EGD) WITH PROPOFOL;  Surgeon: Ladene Artist, MD;  [facility];  Service: Endoscopy;  Laterality: N/A;   EYE SURGERY      Social History   Tobacco Use   Smoking status: Never   Smokeless tobacco: Never  Vaping Use   Vaping Use: Never used  Substance  Use Topics   Alcohol use: No   Drug use: No    Family History  Problem Relation Age of Onset   Aneurysm Mother        brain   Heart disease Father    Heart disease Brother    AAA (abdominal aortic aneurysm) Maternal Uncle    Colon cancer Paternal Uncle        dx >50   Breast cancer Cousin        pat cousin, dx in her late 49s    Allergies  Allergen Reactions   Sulfa Antibiotics Hives    Medication list has been reviewed and updated.  Current Outpatient Medications on File Prior to Visit  Medication Sig Dispense Refill   apixaban (ELIQUIS) 5 MG TABS tablet Take 1 tablet (5 mg total) by mouth 2 (two) times daily. 60 tablet 5   Ascorbic Acid (VITAMIN C) 500 MG CAPS Take 500 mg by mouth daily.     Cinnamon 500 MG capsule Take 1 capsule (500 mg total) by mouth daily.     metoprolol succinate (TOPROL-XL) 25 MG 24 hr tablet TAKE 1 TABLET(25 MG) BY MOUTH DAILY 30 tablet 3   Multiple Vitamins-Minerals (WOMENS MULTIVITAMIN PO) Take by mouth.     pantoprazole (PROTONIX) 40 MG tablet TAKE 1 TABLET(40 MG) BY MOUTH DAILY 90 tablet 1   traMADol (ULTRAM) 50 MG tablet TAKE 1 TABLET BY MOUTH EVERY 8 HOURS AS NEEDED FOR MODERATE PAIN 90 tablet 1   Trolamine Salicylate (ASPERCREME EX) Apply 1 application topically daily as needed (pain).     UNABLE TO FIND in the morning and at bedtime. Beet Vitamin     Vitamin D, Ergocalciferol, 50 MCG (2000 UT) CAPS Take 2,000 Units by mouth daily.     No current facility-administered medications on file prior to visit.    Review of Systems:  As per HPI- otherwise negative.   Physical Examination: There were no vitals filed for this visit. There were no vitals filed for this visit. There is no height or weight on file to calculate BMI. Ideal Body Weight:    GEN: no acute distress. HEENT: Atraumatic, Normocephalic.  Ears and Nose: No external deformity. CV: RRR, No M/G/R. No JVD. No thrill. No extra heart sounds. PULM: CTA B, no wheezes,  crackles, rhonchi. No retractions. No resp. distress. No accessory muscle use. ABD: S, NT, ND, +BS. No rebound. No HSM. EXTR: No c/c/e PSYCH: Normally interactive. Conversant.    Assessment and Plan: ***  Signed Lamar Blinks, MD

## 2021-11-15 ENCOUNTER — Telehealth (INDEPENDENT_AMBULATORY_CARE_PROVIDER_SITE_OTHER): Payer: Medicare Other | Admitting: Family Medicine

## 2021-11-15 VITALS — BP 117/74 | HR 67

## 2021-11-15 DIAGNOSIS — R051 Acute cough: Secondary | ICD-10-CM

## 2021-11-15 MED ORDER — BENZONATATE 100 MG PO CAPS
100.0000 mg | ORAL_CAPSULE | Freq: Three times a day (TID) | ORAL | 0 refills | Status: DC | PRN
Start: 2021-11-15 — End: 2022-01-14

## 2021-11-15 MED ORDER — DOXYCYCLINE HYCLATE 100 MG PO CAPS: 100.0000 mg | ORAL_CAPSULE | Freq: Two times a day (BID) | ORAL | 0 refills | Status: DC

## 2021-11-15 NOTE — Progress Notes (Signed)
[place] at [facility] [address] [place], [place] [address] [phone]  Date:  11/15/2021   Name:  Kathy [place]   DOB:  [birth year]   MRN:  [id number]  PCP:  Darreld Mclean, MD    Chief Complaint: URI (Chest congestion, cough, body aches x 9 days)   History of Present Illness:  Kathy [place] is a 87 y.o. very pleasant female patient who presents with the following:  Virtual visit today for concern of illness  Patient location is home, my location is office.  Connected with the patient via phone.  The patient, myself and her daughter [name] are present on the call Patient had a confirmed with 2 factors, she gives consent for virtual visit today  Pt got sick about one week ago- she has noted "a bug" for about a week Others in the family had it as well One week ago she noted a headache, then dry cough, then a deeper cough She continues to cough She had a fever one day up to "98.something" No vomiting or diarrhea  No ST She feels like she is overall getting better but she continues to feel tired  She is coughing at night No wheezing noted   She did not do a covid test at home but the rest of the family was tested and they were negative   Patient Active Problem List   Diagnosis Date Noted   Knee pain 05/17/2021   Cataract 05/17/2021   Genetic testing 03/27/2018   Family history of colon cancer    Family history of breast cancer    Malignant neoplasm of upper-inner quadrant of left breast in female, estrogen receptor positive ([place]) 01/02/2018   Osteoporosis 01/02/2018   Cameron lesion, acute: Per EGD 11/06/2015 11/06/2015   Erosive gastritis: per EGD 11/05/2105 11/06/2015   Hemorrhoids; Grade 1 per colonoscopy 11/06/2015 11/06/2015   Anemia, iron deficiency    Acute gastric ulcer    Anemia 11/05/2015   Symptomatic anemia 11/05/2015   Heart murmur 11/05/2015   Lower urinary tract infectious disease 11/05/2015    Lower extremity edema 11/05/2015   Arthritis of both knees 11/04/2015    Past Medical History:  Diagnosis Date   Breast cancer ([facility]) 2019   Left Breast Cancer   Breast cancer in female [facility])    Left   Cameron lesion, acute: Per EGD 11/06/2015 11/06/2015   Cataract    Erosive gastritis: per EGD 11/05/2105 11/06/2015   Family history of breast cancer    Family history of colon cancer    Heart murmur    Hemorrhoids; Grade 1 per colonoscopy 11/06/2015 11/06/2015   Knee pain     Past Surgical History:  Procedure Laterality Date   BREAST LUMPECTOMY Left 03/30/2018   BREAST LUMPECTOMY WITH RADIOACTIVE SEED LOCALIZATION Left 03/30/2018   Procedure: RADIOACTIVE SEED GUIDED LEFT BREAST LUMPECTOMY;  Surgeon: Fanny Skates, MD;  Location: [place];  Service: General;  Laterality: Left;   COLONOSCOPY WITH PROPOFOL N/A 11/06/2015   Procedure: COLONOSCOPY WITH PROPOFOL;  Surgeon: Ladene Artist, MD;  Location: [facility];  Service: Endoscopy;  Laterality: N/A;   ESOPHAGOGASTRODUODENOSCOPY (EGD) WITH PROPOFOL N/A 11/06/2015   Procedure: ESOPHAGOGASTRODUODENOSCOPY (EGD) WITH PROPOFOL;  Surgeon: Ladene Artist, MD;  Location: [facility];  Service: Endoscopy;  Laterality: N/A;   EYE SURGERY      Social History   Tobacco Use   Smoking status: Never   Smokeless tobacco:  Never  Vaping Use   Vaping Use: Never used  Substance Use Topics   Alcohol use: No   Drug use: No    Family History  Problem Relation Age of Onset   Aneurysm Mother        brain   Heart disease Father    Heart disease Brother    AAA (abdominal aortic aneurysm) Maternal Uncle    Colon cancer Paternal Uncle        dx >50   Breast cancer Cousin        pat cousin, dx in her late 45s    Allergies  Allergen Reactions   Sulfa Antibiotics Hives    Medication list has been reviewed and updated.  Current Outpatient Medications on File Prior to Visit  Medication Sig Dispense Refill   apixaban (ELIQUIS) 5 MG TABS  tablet Take 1 tablet (5 mg total) by mouth 2 (two) times daily. 60 tablet 5   Ascorbic Acid (VITAMIN C) 500 MG CAPS Take 500 mg by mouth daily.     Cinnamon 500 MG capsule Take 1 capsule (500 mg total) by mouth daily.     metoprolol succinate (TOPROL-XL) 25 MG 24 hr tablet TAKE 1 TABLET(25 MG) BY MOUTH DAILY 30 tablet 3   Multiple Vitamins-Minerals (WOMENS MULTIVITAMIN PO) Take by mouth.     pantoprazole (PROTONIX) 40 MG tablet TAKE 1 TABLET(40 MG) BY MOUTH DAILY 90 tablet 1   traMADol (ULTRAM) 50 MG tablet TAKE 1 TABLET BY MOUTH EVERY 8 HOURS AS NEEDED FOR MODERATE PAIN 90 tablet 1   Trolamine Salicylate (ASPERCREME EX) Apply 1 application topically daily as needed (pain).     UNABLE TO FIND in the morning and at bedtime. Beet Vitamin     Vitamin D, Ergocalciferol, 50 MCG (2000 UT) CAPS Take 2,000 Units by mouth daily.     No current facility-administered medications on file prior to visit.    Review of Systems:  As per HPI- otherwise negative.   Physical Examination: Vitals:   11/15/21 1450  BP: 117/74  Pulse: 67   There were no vitals filed for this visit. There is no height or weight on file to calculate BMI. Ideal Body Weight:    Spoke with patient on the phone.  She sounds well and her normal self  Assessment and Plan: Acute cough - Plan: doxycycline (VIBRAMYCIN) 100 MG capsule, benzonatate (TESSALON) 100 MG capsule Virtual visit today for concern of cough for about 1 week.  The patient did not test herself for COVID, but several other family members have tested negative She is vaccinated against flu She has had 2 COVID-19 shots, I encouraged her to get a booster when she is well For the time being we will treat her with doxycycline and Tessalon Perles I have asked her to let me know if not feeling better in the next few days, sooner if getting worse  Spoke with patient for 6 minutes on the telephone  Signed Lamar Blinks, MD

## 2021-11-17 ENCOUNTER — Ambulatory Visit: Payer: Medicare Other | Admitting: Family Medicine

## 2021-12-07 NOTE — Patient Instructions (Incomplete)
It was great to see you again today, I will be in touch with your labs.  Assuming all is well please see me in about 6 months

## 2021-12-07 NOTE — Progress Notes (Deleted)
[place] at [facility] [address], [place], [place] [address] [phone]  Date:  12/08/2021   Name:  Kathy [place]   DOB:  09-Oct-1934   MRN:  [id number]  PCP:  Darreld Mclean, MD    Chief Complaint: No chief complaint on file.   History of Present Illness:  Kathy [place] is a 88 y.o. very pleasant female patient who presents with the following:  Patient seen today for 54-month follow-up visit Most recently seen by myself virtually in mid December with concern of cough Otherwise we saw each other in June-that time she was incidentally found to be in atrial fib  History of breast cancer 2018, chronic arthritis pain, osteoporosis, chronic renal insufficiency She had an ulcer in 2018 or 2019 due to NSAID use, we now use tramadol for her knee pain  Most recent oncology visit, Dr. Jana Hakim in [place] is status post definitive surgery for breast cancer with no evidence of recurrence.  Doing well, plan for 1 year follow-up at which point she will be released from care  Most recent cardiology visit in July-at that time she was back in sinus rhythm, being treated with Eliquis 5 twice daily Also Toprol-XL 25 Needs toxicology screen due to chronic tramadol use In October she had a CMP, CBC with differential Can check lipid profile today, TSH, A1c Patient Active Problem List   Diagnosis Date Noted   Knee pain 05/17/2021   Cataract 05/17/2021   Genetic testing 03/27/2018   Family history of colon cancer    Family history of breast cancer    Malignant neoplasm of upper-inner quadrant of left breast in female, estrogen receptor positive ([place]) 01/02/2018   Osteoporosis 01/02/2018   Cameron lesion, acute: Per EGD 11/06/2015 11/06/2015   Erosive gastritis: per EGD 11/05/2105 11/06/2015   Hemorrhoids; Grade 1 per colonoscopy 11/06/2015 11/06/2015   Anemia, iron deficiency    Acute gastric ulcer    Anemia 11/05/2015   Symptomatic  anemia 11/05/2015   Heart murmur 11/05/2015   Lower urinary tract infectious disease 11/05/2015   Lower extremity edema 11/05/2015   Arthritis of both knees 11/04/2015    Past Medical History:  Diagnosis Date   Breast cancer ([place]) 2019   Left Breast Cancer   Breast cancer in female [facility])    Left   Cameron lesion, acute: Per EGD 11/06/2015 11/06/2015   Cataract    Erosive gastritis: per EGD 11/05/2105 11/06/2015   Family history of breast cancer    Family history of colon cancer    Heart murmur    Hemorrhoids; Grade 1 per colonoscopy 11/06/2015 11/06/2015   Knee pain     Past Surgical History:  Procedure Laterality Date   BREAST LUMPECTOMY Left 03/30/2018   BREAST LUMPECTOMY WITH RADIOACTIVE SEED LOCALIZATION Left 03/30/2018   Procedure: RADIOACTIVE SEED GUIDED LEFT BREAST LUMPECTOMY;  Surgeon: Fanny Skates, MD;  Location: [place];  Service: General;  Laterality: Left;   COLONOSCOPY WITH PROPOFOL N/A 11/06/2015   Procedure: COLONOSCOPY WITH PROPOFOL;  Surgeon: Ladene Artist, MD;  [facility];  Service: Endoscopy;  Laterality: N/A;   ESOPHAGOGASTRODUODENOSCOPY (EGD) WITH PROPOFOL N/A 11/06/2015   Procedure: ESOPHAGOGASTRODUODENOSCOPY (EGD) WITH PROPOFOL;  Surgeon: Ladene Artist, MD;  [facility];  Service: Endoscopy;  Laterality: N/A;   EYE SURGERY      Social History   Tobacco Use   Smoking status: Never   Smokeless tobacco: Never  Vaping  Use   Vaping Use: Never used  Substance Use Topics   Alcohol use: No   Drug use: No    Family History  Problem Relation Age of Onset   Aneurysm Mother        brain   Heart disease Father    Heart disease Brother    AAA (abdominal aortic aneurysm) Maternal Uncle    Colon cancer Paternal Uncle        dx >50   Breast cancer Cousin        pat cousin, dx in her late 65s    Allergies  Allergen Reactions   Sulfa Antibiotics Hives    Medication list has been reviewed and updated.  Current Outpatient  Medications on File Prior to Visit  Medication Sig Dispense Refill   apixaban (ELIQUIS) 5 MG TABS tablet Take 1 tablet (5 mg total) by mouth 2 (two) times daily. 60 tablet 5   Ascorbic Acid (VITAMIN C) 500 MG CAPS Take 500 mg by mouth daily.     benzonatate (TESSALON) 100 MG capsule Take 1 capsule (100 mg total) by mouth 3 (three) times daily as needed for cough. 40 capsule 0   Cinnamon 500 MG capsule Take 1 capsule (500 mg total) by mouth daily.     doxycycline (VIBRAMYCIN) 100 MG capsule Take 1 capsule (100 mg total) by mouth 2 (two) times daily. 20 capsule 0   metoprolol succinate (TOPROL-XL) 25 MG 24 hr tablet TAKE 1 TABLET(25 MG) BY MOUTH DAILY 30 tablet 3   Multiple Vitamins-Minerals (WOMENS MULTIVITAMIN PO) Take by mouth.     pantoprazole (PROTONIX) 40 MG tablet TAKE 1 TABLET(40 MG) BY MOUTH DAILY 90 tablet 1   traMADol (ULTRAM) 50 MG tablet TAKE 1 TABLET BY MOUTH EVERY 8 HOURS AS NEEDED FOR MODERATE PAIN 90 tablet 1   Trolamine Salicylate (ASPERCREME EX) Apply 1 application topically daily as needed (pain).     UNABLE TO FIND in the morning and at bedtime. Beet Vitamin     Vitamin D, Ergocalciferol, 50 MCG (2000 UT) CAPS Take 2,000 Units by mouth daily.     No current facility-administered medications on file prior to visit.    Review of Systems:  As per HPI- otherwise negative.   Physical Examination: There were no vitals filed for this visit. There were no vitals filed for this visit. There is no height or weight on file to calculate BMI. Ideal Body Weight:    GEN: no acute distress. HEENT: Atraumatic, Normocephalic.  Ears and Nose: No external deformity. CV: RRR, No M/G/R. No JVD. No thrill. No extra heart sounds. PULM: CTA B, no wheezes, crackles, rhonchi. No retractions. No resp. distress. No accessory muscle use. ABD: S, NT, ND, +BS. No rebound. No HSM. EXTR: No c/c/e PSYCH: Normally interactive. Conversant.    Assessment and Plan: ***  Signed Lamar Blinks,  MD

## 2021-12-08 ENCOUNTER — Ambulatory Visit: Payer: Medicare Other | Admitting: Family Medicine

## 2021-12-10 ENCOUNTER — Ambulatory Visit: Payer: Medicare Other | Admitting: Cardiology

## 2022-01-06 ENCOUNTER — Other Ambulatory Visit: Payer: Self-pay | Admitting: Family Medicine

## 2022-01-06 DIAGNOSIS — I4891 Unspecified atrial fibrillation: Secondary | ICD-10-CM

## 2022-01-14 ENCOUNTER — Other Ambulatory Visit: Payer: Self-pay

## 2022-01-14 ENCOUNTER — Encounter: Payer: Self-pay | Admitting: Cardiology

## 2022-01-14 ENCOUNTER — Ambulatory Visit: Payer: Medicare Other | Admitting: Cardiology

## 2022-01-14 VITALS — BP 136/66 | HR 65 | Ht <= 58 in | Wt 145.2 lb

## 2022-01-14 DIAGNOSIS — I480 Paroxysmal atrial fibrillation: Secondary | ICD-10-CM

## 2022-01-14 DIAGNOSIS — I361 Nonrheumatic tricuspid (valve) insufficiency: Secondary | ICD-10-CM | POA: Diagnosis not present

## 2022-01-14 DIAGNOSIS — R600 Localized edema: Secondary | ICD-10-CM

## 2022-01-14 MED ORDER — FUROSEMIDE 20 MG PO TABS
20.0000 mg | ORAL_TABLET | ORAL | 3 refills | Status: DC
Start: 2022-01-14 — End: 2023-03-06

## 2022-01-14 MED ORDER — POTASSIUM CHLORIDE ER 10 MEQ PO TBCR: 10.0000 meq | EXTENDED_RELEASE_TABLET | ORAL | 3 refills | Status: DC

## 2022-01-14 NOTE — Patient Instructions (Addendum)
Medication Instructions:  Your physician has recommended you make the following change in your medication:  START: Lasix 20 mg once  every 2 weeks START: Potassium 10 mEq once every 2 weeks *If you need a refill on your cardiac medications before your next appointment, please call your pharmacy*   Lab Work: None If you have labs (blood work) drawn today and your tests are completely normal, you will receive your results only by: [place] (if you have MyChart) OR A paper copy in the mail If you have any lab test that is abnormal or we need to change your treatment, we will call you to review the results.   Testing/Procedures: None   Follow-Up: At [facility], you and your health needs are our priority.  As part of our continuing mission to provide you with exceptional heart care, we have created designated Provider Care Teams.  These Care Teams include your primary Cardiologist (physician) and Advanced Practice Providers (APPs -  Physician Assistants and Nurse Practitioners) who all work together to provide you with the care you need, when you need it.  We recommend signing up for the patient portal called "MyChart".  Sign up information is provided on this After Visit Summary.  MyChart is used to connect with patients for Virtual Visits (Telemedicine).  Patients are able to view lab/test results, encounter notes, upcoming appointments, etc.  Non-urgent messages can be sent to your provider as well.   To learn more about what you can do with MyChart, go to [email or web address].    Your next appointment:   1 year(s)  The format for your next appointment:   In Person  Provider:   Berniece Salines, DO     Other Instructions

## 2022-01-14 NOTE — Progress Notes (Signed)
Cardiology Office Note:    Date:  01/14/2022   ID:  DAVANNA Thornton, DOB Feb 06, 1934, MRN [id number]  PCP:  Darreld Mclean, MD  Cardiologist:  Berniece Salines, DO  Electrophysiologist:  None   Referring MD: Darreld Mclean, MD   " I am doing well"   History of Present Illness:    Kathy Thornton is a 88 y.o. female with a hx of breast cancer status post lobectomy and radiation, paroxysmal atrial fibrillation on metoprolol as well as Eliquis, mild to moderate tricuspid regurgitation.  The patient is here for follow-up visit  I saw the patient in July 2022 at that time Thornton had just been diagnosed with atrial fibrillation she had been started on metoprolol as well as Eliquis.  During her visit she was doing well and was in sinus rhythm.  Placed a monitor on the patient at that time to understand her A-fib burden.  She did wear the monitor which did show significant burden of paroxysmal supraventricular tachycardia no clear atrial fibrillation.  She is here today for follow-up visit.  She has been doing well from a cardiovascular standpoint.  She tells me she has noticed a few episodes where she thinks she was in atrial fibrillation that lasted a few minutes and then resolved.  No other complaints at this time.   Past Medical History:  Diagnosis Date   Breast cancer ([place]) 2019   Left Breast Cancer   Breast cancer in female [facility])    Left   [name] lesion, acute: Per EGD 11/06/2015 11/06/2015   Cataract    Erosive gastritis: per EGD 11/05/2105 11/06/2015   Family history of breast cancer    Family history of colon cancer    Heart murmur    Hemorrhoids; Grade 1 per colonoscopy 11/06/2015 11/06/2015   Knee pain     Past Surgical History:  Procedure Laterality Date   BREAST LUMPECTOMY Left 03/30/2018   BREAST LUMPECTOMY WITH RADIOACTIVE SEED LOCALIZATION Left 03/30/2018   Procedure: RADIOACTIVE SEED GUIDED LEFT BREAST LUMPECTOMY;  Surgeon: Fanny Skates, MD;  Location: [place];  Service:  General;  Laterality: Left;   COLONOSCOPY WITH PROPOFOL N/A 11/06/2015   Procedure: COLONOSCOPY WITH PROPOFOL;  Surgeon: Ladene Artist, MD;  Location: [facility];  Service: Endoscopy;  Laterality: N/A;   ESOPHAGOGASTRODUODENOSCOPY (EGD) WITH PROPOFOL N/A 11/06/2015   Procedure: ESOPHAGOGASTRODUODENOSCOPY (EGD) WITH PROPOFOL;  Surgeon: Ladene Artist, MD;  Location: [facility];  Service: Endoscopy;  Laterality: N/A;   EYE SURGERY      Current Medications: Current Meds  Medication Sig   apixaban (ELIQUIS) 5 MG TABS tablet Take 1 tablet (5 mg total) by mouth 2 (two) times daily.   Ascorbic Acid (VITAMIN C) 500 MG CAPS Take 500 mg by mouth daily.   Cholecalciferol (VITAMIN D3 PO) Take by mouth daily.   Cinnamon 500 MG capsule Take 1 capsule (500 mg total) by mouth daily.   furosemide (LASIX) 20 MG tablet Take 1 tablet (20 mg total) by mouth every 14 (fourteen) days.   metoprolol succinate (TOPROL-XL) 25 MG 24 hr tablet TAKE 1 TABLET(25 MG) BY MOUTH DAILY   Multiple Vitamins-Minerals (WOMENS MULTIVITAMIN PO) Take by mouth.   pantoprazole (PROTONIX) 40 MG tablet TAKE 1 TABLET(40 MG) BY MOUTH DAILY   potassium chloride (KLOR-CON) 10 MEQ tablet Take 1 tablet (10 mEq total) by mouth every 14 (fourteen) days.   traMADol (ULTRAM) 50 MG tablet TAKE 1 TABLET BY MOUTH EVERY 8 HOURS AS NEEDED FOR  MODERATE PAIN   Trolamine Salicylate (ASPERCREME EX) Apply 1 application topically daily as needed (pain).     Allergies:   Sulfa antibiotics   Social History   Socioeconomic History   Marital status: Married    Spouse name: Not on file   Number of children: Not on file   Years of education: Not on file   Highest education level: Not on file  Occupational History   Not on file  Tobacco Use   Smoking status: Never   Smokeless tobacco: Never  Vaping Use   Vaping Use: Never used  Substance and Sexual Activity   Alcohol use: No   Drug use: No   Sexual activity: Not on file  Other Topics  Concern   Not on file  Social History Narrative   Not on file   Social Determinants of Health   Financial Resource Strain: Not on file  Food Insecurity: Not on file  Transportation Needs: Not on file  Physical Activity: Not on file  Stress: Not on file  Social Connections: Not on file     Family History: The patient's family history includes AAA (abdominal aortic aneurysm) in her maternal uncle; Aneurysm in her mother; Breast cancer in her cousin; Colon cancer in her paternal uncle; Heart disease in her brother and father.  ROS:   Review of Systems  Constitution: Negative for decreased appetite, fever and weight gain.  HENT: Negative for congestion, ear discharge, hoarse voice and sore throat.   Eyes: Negative for discharge, redness, vision loss in right eye and visual halos.  Cardiovascular: Bilateral leg swelling.  Negative for chest pain, dyspnea on exertion, orthopnea and palpitations.  Respiratory: Negative for cough, hemoptysis, shortness of breath and snoring.   Endocrine: Negative for heat intolerance and polyphagia.  Hematologic/Lymphatic: Negative for bleeding problem. Does not bruise/bleed easily.  Skin: Negative for flushing, nail changes, rash and suspicious lesions.  Musculoskeletal: Negative for arthritis, joint pain, muscle cramps, myalgias, neck pain and stiffness.  Gastrointestinal: Negative for abdominal pain, bowel incontinence, diarrhea and excessive appetite.  Genitourinary: Negative for decreased libido, genital sores and incomplete emptying.  Neurological: Negative for brief paralysis, focal weakness, headaches and loss of balance.  Psychiatric/Behavioral: Negative for altered mental status, depression and suicidal ideas.  Allergic/Immunologic: Negative for HIV exposure and persistent infections.    EKGs/Labs/Other Studies Reviewed:    The following studies were reviewed today:   EKG: None today  Zio monitor Patch Wear Time:  3 days and 1 hours  starting June 18, 2021. Indication: Paroxysmal atrial fibrillation.     Patient had a min HR of 48 bpm, max HR of 154 bpm, and avg HR of 63 bpm.    Predominant underlying rhythm was Sinus Rhythm. Bundle Branch Block-interventricular conduction delay was present.    1583 Supraventricular Tachycardia runs occurred, the run with the fastest interval lasting 5.1 secs with a max rate of 154 bpm, the longest lasting 32.8 secs with an avg rate of 115 bpm.    Premature atrial complexes were rare (<1.0%). Premature ventricular complexes were rare (<1.0%).   No patient triggered events or diary events reported.      Conclusion: Paroxysmal supraventricular tachycardia which is suspected to be atrial tachycardia with variable block.  Echocardiogram June 09, 2021 IMPRESSIONS     1. Left ventricular ejection fraction, by estimation, is 55 to 60%. The  left ventricle has normal function. The left ventricle has no regional  wall motion abnormalities. Left ventricular diastolic parameters are  indeterminate. Elevated left ventricular  end-diastolic pressure. The average left ventricular global longitudinal  strain is -21.2 %. The global longitudinal strain is normal.   2. Right ventricular systolic function is normal. The right ventricular  size is normal. There is mildly elevated pulmonary artery systolic  pressure. The estimated right ventricular systolic pressure is 49.8 mmHg.   3. The mitral valve is normal in structure. Mild mitral valve  regurgitation. No evidence of mitral stenosis.   4. Tricuspid valve regurgitation is mild to moderate.   5. The aortic valve is normal in structure. Aortic valve regurgitation is  not visualized. No aortic stenosis is present.   6. The inferior vena cava is normal in size with greater than 50%  respiratory variability, suggesting right atrial pressure of 3 mmHg.   FINDINGS   Left Ventricle: Left ventricular ejection fraction, by estimation, is 55  to  60%. The left ventricle has normal function. The left ventricle has no  regional wall motion abnormalities. The average left ventricular global  longitudinal strain is -21.2 %.  The global longitudinal strain is normal. The left ventricular internal  cavity size was normal in size. There is no left ventricular hypertrophy.  Left ventricular diastolic parameters are indeterminate. Elevated left  ventricular end-diastolic pressure.   Right Ventricle: The right ventricular size is normal. No increase in  right ventricular wall thickness. Right ventricular systolic function is  normal. There is mildly elevated pulmonary artery systolic pressure. The  tricuspid regurgitant velocity is 3.05   m/s, and with an assumed right atrial pressure of 3 mmHg, the estimated  right ventricular systolic pressure is 26.4 mmHg.   Left Atrium: Left atrial size was normal in size.   Right Atrium: Right atrial size was normal in size.   Pericardium: There is no evidence of pericardial effusion.   Mitral Valve: The mitral valve is normal in structure. Mild mitral valve  regurgitation. No evidence of mitral valve stenosis.   Tricuspid Valve: The tricuspid valve is normal in structure. Tricuspid  valve regurgitation is mild to moderate. No evidence of tricuspid  stenosis.   Aortic Valve: The aortic valve is normal in structure. Aortic valve  regurgitation is not visualized. No aortic stenosis is present.   Pulmonic Valve: The pulmonic valve was normal in structure. Pulmonic valve  regurgitation is trivial. No evidence of pulmonic stenosis.   Aorta: The aortic root is normal in size and structure.   Venous: The inferior vena cava is normal in size with greater than 50%  respiratory variability, suggesting right atrial pressure of 3 mmHg.   IAS/Shunts: No atrial level shunt detected by color flow Doppler.   Recent Labs: 05/12/2021: Pro B Natriuretic peptide (BNP) 199.0; TSH 1.80 09/15/2021: ALT 13; BUN  12; Creatinine 0.97; Hemoglobin 12.2; Platelet Count 184; Potassium 3.8; Sodium 139  Recent Lipid Panel    Component Value Date/Time   CHOL 165 11/11/2020 1346   TRIG 63.0 11/11/2020 1346   HDL 70.70 11/11/2020 1346   CHOLHDL 2 11/11/2020 1346   VLDL 12.6 11/11/2020 1346   LDLCALC 82 11/11/2020 1346    Physical Exam:    VS:  BP 136/66    Pulse 65    Ht 4\' 9"  (1.448 m)    Wt 145 lb 3.2 oz (65.9 kg)    SpO2 96%    BMI 31.42 kg/m     Wt Readings from Last 3 Encounters:  01/14/22 145 lb 3.2 oz (65.9 kg)  09/15/21 145 lb 14.4 oz (  66.2 kg)  06/10/21 143 lb 1.3 oz (64.9 kg)     GEN: Well nourished, well developed in no acute distress HEENT: Normal NECK: No JVD; No carotid bruits LYMPHATICS: No lymphadenopathy CARDIAC: S1S2 noted,RRR, no murmurs, rubs, gallops RESPIRATORY:  Clear to auscultation without rales, wheezing or rhonchi  ABDOMEN: Soft, non-tender, non-distended, +bowel sounds, no guarding. EXTREMITIES: +2 bilateral lower extremity edema, No cyanosis, no clubbing MUSCULOSKELETAL:  No deformity  SKIN: Warm and dry NEUROLOGIC:  Alert and oriented x 3, non-focal PSYCHIATRIC:  Normal affect, good insight  ASSESSMENT:    1. PAF (paroxysmal atrial fibrillation) ([place])   2. Nonrheumatic tricuspid valve regurgitation   3. Bilateral leg edema    PLAN:    She reports episodes of short transient sensation that she feels she is in atrial fibrillation but tells me does not last long.  I suggested use of antiarrhythmic she has declined at this time.  We will continue patient on metoprolol as well as her anticoagulation with Eliquis.  She does have bilateral leg edema in the setting of her mild to moderate [place] regurgitation we will give the patient cautious diuretics 20 mg of Lasix every other week with potassium supplement.  Review blood work that was done in October 2022.  The patient is in agreement with the above plan. The patient left the office in stable condition.  The  patient will follow up in 1 year or sooner if needed.   Medication Adjustments/Labs and Tests Ordered: Current medicines are reviewed at length with the patient today.  Concerns regarding medicines are outlined above.  No orders of the defined types were placed in this encounter.  Meds ordered this encounter  Medications   furosemide (LASIX) 20 MG tablet    Sig: Take 1 tablet (20 mg total) by mouth every 14 (fourteen) days.    Dispense:  7 tablet    Refill:  3   potassium chloride (KLOR-CON) 10 MEQ tablet    Sig: Take 1 tablet (10 mEq total) by mouth every 14 (fourteen) days.    Dispense:  7 tablet    Refill:  3    Patient Instructions  Medication Instructions:  Your physician has recommended you make the following change in your medication:  START: Lasix 20 mg once  every 2 weeks START: Potassium 10 mEq once every 2 weeks *If you need a refill on your cardiac medications before your next appointment, please call your pharmacy*   Lab Work: None If you have labs (blood work) drawn today and your tests are completely normal, you will receive your results only by: [place] (if you have MyChart) OR A paper copy in the mail If you have any lab test that is abnormal or we need to change your treatment, we will call you to review the results.   Testing/Procedures: None   Follow-Up: At [facility] you and your health needs are our priority.  As part of our continuing mission to provide you with exceptional heart care, we have created designated Provider Care Teams.  These Care Teams include your primary Cardiologist (physician) and Advanced Practice Providers (APPs -  Physician Assistants and Nurse Practitioners) who all work together to provide you with the care you need, when you need it.  We recommend signing up for the patient portal called "MyChart".  Sign up information is provided on this After Visit Summary.  MyChart is used to connect with patients for Virtual  Visits (Telemedicine).  Patients are able to view  lab/test results, encounter notes, upcoming appointments, etc.  Non-urgent messages can be sent to your provider as well.   To learn more about what you can do with MyChart, go to [email or web address].    Your next appointment:   1 year(s)  The format for your next appointment:   In Person  Provider:   Berniece Salines, DO     Other Instructions     Adopting a Healthy Lifestyle.  Know what a healthy weight is for you (roughly BMI <25) and aim to maintain this   Aim for 7+ servings of fruits and vegetables daily   65-80+ fluid ounces of water or unsweet tea for healthy kidneys   Limit to max 1 drink of alcohol per day; avoid smoking/tobacco   Limit animal fats in diet for cholesterol and heart health - choose grass fed whenever available   Avoid highly processed foods, and foods high in saturated/trans fats   Aim for low stress - take time to unwind and care for your mental health   Aim for 150 min of moderate intensity exercise weekly for heart health, and weights twice weekly for bone health   Aim for 7-9 hours of sleep daily   When it comes to diets, agreement about the perfect plan isnt easy to find, even among the experts. Experts at the [employer] developed an idea known as the Healthy Eating Plate. Just imagine a plate divided into logical, healthy portions.   The emphasis is on diet quality:   Load up on vegetables and fruits - one-half of your plate: Aim for color and variety, and remember that potatoes dont count.   Go for whole grains - one-quarter of your plate: Whole wheat, barley, wheat berries, quinoa, oats, brown rice, and foods made with them. If you want pasta, go with whole wheat pasta.   Protein power - one-quarter of your plate: Fish, chicken, beans, and nuts are all healthy, versatile protein sources. Limit red meat.   The diet, however, does go beyond the plate, offering a few  other suggestions.   Use healthy plant oils, such as olive, canola, soy, corn, sunflower and peanut. Check the labels, and avoid partially hydrogenated oil, which have unhealthy trans fats.   If youre thirsty, drink water. Coffee and tea are good in moderation, but skip sugary drinks and limit milk and dairy products to one or two daily servings.   The type of carbohydrate in the diet is more important than the amount. Some sources of carbohydrates, such as vegetables, fruits, whole grains, and beans-are healthier than others.   Finally, stay active  Signed, Berniece Salines, DO  01/14/2022 10:01 AM    [place]

## 2022-01-18 NOTE — Patient Instructions (Addendum)
It was great to see you again today, assuming all is well please see me in about 6 months I think ok to use the lasix and potassium once a WEEK if you like  Try some compression sock for your leg swelling

## 2022-01-18 NOTE — Progress Notes (Addendum)
[place] at [facility] [place], [place], [place] [address] [phone]  Date:  01/19/2022   Name:  Kathy Thornton   DOB:  [birth year]   MRN:  [id number]  PCP:  Darreld Mclean, MD    Chief Complaint: 6 month follow up (Concerns/ questions: none/Zoster: none in ncir)   History of Present Illness:  Kathy Thornton is a 88 y.o. very pleasant female patient who presents with the following:  Patient seen today for routine follow-up- here today with [name] Most recent visit with myself was a virtual visit for illness in December-otherwise our last appointment was in June History of breast cancer 2018, chronic arthritis pain, osteoporosis, chronic renal insufficiency, atrial fibrillation She had an ulcer in 2018 or 2019 due to NSAID use, we now use tramadol for her knee pain  Most recent visit with cardiology was February 10- Dr Harriet Masson She reports episodes of short transient sensation that she feels she is in atrial fibrillation but tells me does not last long.  I suggested use of antiarrhythmic she has declined at this time.  We will continue patient on metoprolol as well as her anticoagulation with Eliquis. She does have bilateral leg edema in the setting of her mild to moderate Checketts regurgitation we will give the patient cautious diuretics 20 mg of Lasix every other week with potassium supplement.  Most recent visit with oncology, Dr. Jana Hakim in October She continues routine screening mammograms, most recently October 2022 No evidence of breast cancer recurrence status post definitive surgery  Patient had 2 COVID-19 vaccines-recommended a booster, she would like to do this She has not yet had Shingrix CMP, CBC done in October Can update lipid profile if she would like  Eliquis 5 twice daily Lasix 20, intermittent use-potassium when Lasix is used Toprol-XL 25 Protonix Tramadol 50 every 8 hours as  needed   Patient Active Problem List   Diagnosis Date Noted   Knee pain 05/17/2021   Cataract 05/17/2021   Genetic testing 03/27/2018   Family history of colon cancer    Family history of breast cancer    Malignant neoplasm of upper-inner quadrant of left breast in female, estrogen receptor positive ([place]) 01/02/2018   Osteoporosis 01/02/2018   Cameron lesion, acute: Per EGD 11/06/2015 11/06/2015   Erosive gastritis: per EGD 11/05/2105 11/06/2015   Hemorrhoids; Grade 1 per colonoscopy 11/06/2015 11/06/2015   Anemia, iron deficiency    Acute gastric ulcer    Anemia 11/05/2015   Symptomatic anemia 11/05/2015   Heart murmur 11/05/2015   Lower urinary tract infectious disease 11/05/2015   Lower extremity edema 11/05/2015   Arthritis of both knees 11/04/2015    Past Medical History:  Diagnosis Date   Breast cancer ([place]) 2019   Left Breast Cancer   Breast cancer in female [facility])    Left   Cameron lesion, acute: Per EGD 11/06/2015 11/06/2015   Cataract    Erosive gastritis: per EGD 11/05/2105 11/06/2015   Family history of breast cancer    Family history of colon cancer    Heart murmur    Hemorrhoids; Grade 1 per colonoscopy 11/06/2015 11/06/2015   Knee pain     Past Surgical History:  Procedure Laterality Date   BREAST LUMPECTOMY Left 03/30/2018   BREAST LUMPECTOMY WITH RADIOACTIVE SEED LOCALIZATION Left 03/30/2018   Procedure: RADIOACTIVE SEED GUIDED LEFT BREAST LUMPECTOMY;  Surgeon: Fanny Skates, MD;  Location: [facility];  Service:  General;  Laterality: Left;   COLONOSCOPY WITH PROPOFOL N/A 11/06/2015   Procedure: COLONOSCOPY WITH PROPOFOL;  Surgeon: Ladene Artist, MD;  Location: [facility];  Service: Endoscopy;  Laterality: N/A;   ESOPHAGOGASTRODUODENOSCOPY (EGD) WITH PROPOFOL N/A 11/06/2015   Procedure: ESOPHAGOGASTRODUODENOSCOPY (EGD) WITH PROPOFOL;  Surgeon: Ladene Artist, MD;  Location: [facility];  Service: Endoscopy;  Laterality: N/A;   EYE SURGERY      Social History    Tobacco Use   Smoking status: Never   Smokeless tobacco: Never  Vaping Use   Vaping Use: Never used  Substance Use Topics   Alcohol use: No   Drug use: No    Family History  Problem Relation Age of Onset   Aneurysm Mother        brain   Heart disease Father    Heart disease Brother    AAA (abdominal aortic aneurysm) Maternal Uncle    Colon cancer Paternal Uncle        dx >50   Breast cancer Cousin        pat cousin, dx in her late 34s    Allergies  Allergen Reactions   Sulfa Antibiotics Hives    Medication list has been reviewed and updated.  Current Outpatient Medications on File Prior to Visit  Medication Sig Dispense Refill   apixaban (ELIQUIS) 5 MG TABS tablet Take 1 tablet (5 mg total) by mouth 2 (two) times daily. 60 tablet 5   Ascorbic Acid (VITAMIN C) 500 MG CAPS Take 500 mg by mouth daily.     Cholecalciferol (VITAMIN D3 PO) Take by mouth daily.     Cinnamon 500 MG capsule Take 1 capsule (500 mg total) by mouth daily.     furosemide (LASIX) 20 MG tablet Take 1 tablet (20 mg total) by mouth every 14 (fourteen) days. 7 tablet 3   metoprolol succinate (TOPROL-XL) 25 MG 24 hr tablet TAKE 1 TABLET(25 MG) BY MOUTH DAILY 30 tablet 3   Multiple Vitamins-Minerals (WOMENS MULTIVITAMIN PO) Take by mouth.     pantoprazole (PROTONIX) 40 MG tablet TAKE 1 TABLET(40 MG) BY MOUTH DAILY 90 tablet 1   potassium chloride (KLOR-CON) 10 MEQ tablet Take 1 tablet (10 mEq total) by mouth every 14 (fourteen) days. 7 tablet 3   traMADol (ULTRAM) 50 MG tablet TAKE 1 TABLET BY MOUTH EVERY 8 HOURS AS NEEDED FOR MODERATE PAIN 90 tablet 1   Trolamine Salicylate (ASPERCREME EX) Apply 1 application topically daily as needed (pain).     No current facility-administered medications on file prior to visit.    Review of Systems:  As per HPI- otherwise negative.   Physical Examination: Vitals:   01/19/22 1323  BP: (!) 142/80  Pulse: 61  Resp: 18  Temp: 98.1 F (36.7 C)  SpO2: 96%    Vitals:   01/19/22 1323  Weight: 144 lb 6.4 oz (65.5 kg)  Height: 4\' 9"  (1.448 m)   Body mass index is 31.25 kg/m. Ideal Body Weight: Weight in (lb) to have BMI = 25: 115.3  GEN: no acute distress.  Looks well and in excellent shape for age 68: Atraumatic, Normocephalic.  Ears and Nose: No external deformity. CV: RRR, No M/G/R. No JVD. No thrill. No extra heart sounds. PULM: CTA B, no wheezes, crackles, rhonchi. No retractions. No resp. distress. No accessory muscle use. ABD: S, NT, ND. No rebound. No HSM. EXTR: No c/c/she has trace edema in both lower extremities PSYCH: Normally interactive. Conversant.  She does use  2 canes to walk at this point Patient has onychomycosis of most toenails.  She would like to treat this if possible  Assessment and Plan: Atrial fibrillation, unspecified type ([place]) - Plan: Basic metabolic panel  Arthritis of both knees  History of gastric ulcer  Medication monitoring encounter - Plan: Basic metabolic panel  Lower extremity edema  Screening for hyperlipidemia - Plan: Lipid panel  Onychomycosis - Plan: terbinafine (LAMISIL) 250 MG tablet  Following up today Encouraged COVID-19 booster which she plans to do Also encouraged Shingrix vaccine at pharmacy Atrial fibrillation is appropriately treated with beta-blocker and Eliquis She uses Lasix with a dose of potassium every 2 weeks.  Advised she could increase this to every week if she wishes.  Also encouraged her to try compression socks Called in terbinafine for 12 weeks for onychomycosis Will plan further follow- up pending labs. Plan to visit in 6 months Signed Lamar Blinks, MD  Addendum 2/16, received her labs as below.  Letter to patient  Results for orders placed or performed in visit on 53/66/44  Basic metabolic panel  Result Value Ref Range   Sodium 138 135 - 145 mEq/L   Potassium 4.8 3.5 - 5.1 mEq/L   Chloride 104 96 - 112 mEq/L   CO2 30 19 - 32 mEq/L   Glucose, Bld  85 70 - 99 mg/dL   BUN 17 6 - 23 mg/dL   Creatinine, Ser 0.95 0.40 - 1.20 mg/dL   GFR 54.47 (L) >60.00 mL/min   Calcium 9.1 8.4 - 10.5 mg/dL  Lipid panel  Result Value Ref Range   Cholesterol 147 0 - 200 mg/dL   Triglycerides 56.0 0.0 - 149.0 mg/dL   HDL 71.50 >39.00 mg/dL   VLDL 11.2 0.0 - 40.0 mg/dL   LDL Cholesterol 64 0 - 99 mg/dL   Total CHOL/HDL Ratio 2    NonHDL 75.66

## 2022-01-19 ENCOUNTER — Ambulatory Visit (INDEPENDENT_AMBULATORY_CARE_PROVIDER_SITE_OTHER): Payer: Medicare Other | Admitting: Family Medicine

## 2022-01-19 VITALS — BP 142/80 | HR 61 | Temp 98.1°F | Resp 18 | Ht <= 58 in | Wt 144.4 lb

## 2022-01-19 DIAGNOSIS — M170 Bilateral primary osteoarthritis of knee: Secondary | ICD-10-CM | POA: Diagnosis not present

## 2022-01-19 DIAGNOSIS — R600 Localized edema: Secondary | ICD-10-CM

## 2022-01-19 DIAGNOSIS — Z8711 Personal history of peptic ulcer disease: Secondary | ICD-10-CM | POA: Diagnosis not present

## 2022-01-19 DIAGNOSIS — Z5181 Encounter for therapeutic drug level monitoring: Secondary | ICD-10-CM

## 2022-01-19 DIAGNOSIS — B351 Tinea unguium: Secondary | ICD-10-CM

## 2022-01-19 DIAGNOSIS — Z13220 Encounter for screening for lipoid disorders: Secondary | ICD-10-CM

## 2022-01-19 DIAGNOSIS — I4891 Unspecified atrial fibrillation: Secondary | ICD-10-CM

## 2022-01-19 MED ORDER — TERBINAFINE HCL 250 MG PO TABS: 250.0000 mg | ORAL_TABLET | Freq: Every day | ORAL | 0 refills | Status: DC

## 2022-01-20 LAB — LIPID PANEL
Cholesterol: 147 mg/dL (ref 0–200)
HDL: 71.5 mg/dL (ref >39.00)
LDL Cholesterol: 64 mg/dL (ref 0–99)
NonHDL: 75.66
Total CHOL/HDL Ratio: 2
Triglycerides: 56 mg/dL (ref 0.0–149.0)
VLDL: 11.2 mg/dL (ref 0.0–40.0)

## 2022-01-20 LAB — BASIC METABOLIC PANEL
BUN: 17 mg/dL (ref 6–23)
CO2: 30 mEq/L (ref 19–32)
Calcium: 9.1 mg/dL (ref 8.4–10.5)
Chloride: 104 mEq/L (ref 96–112)
Creatinine, Ser: 0.95 mg/dL (ref 0.40–1.20)
GFR: 54.47 mL/min — ABNORMAL LOW (ref >60.00)
Glucose, Bld: 85 mg/dL (ref 70–99)
Potassium: 4.8 mEq/L (ref 3.5–5.1)
Sodium: 138 mEq/L (ref 135–145)

## 2022-02-11 ENCOUNTER — Telehealth: Payer: Self-pay | Admitting: Family Medicine

## 2022-02-11 NOTE — Telephone Encounter (Signed)
Left message for patient to call back and schedule Medicare Annual Wellness Visit (AWV) in office.  ? ?If not able to come in office, please offer to do virtually or by telephone.  Left office number and my jabber [phone]. ? ?Last AWV:11/08/2018 ? ?Please schedule at anytime with Nurse Health Advisor. ?  ?

## 2022-02-16 DIAGNOSIS — M1611 Unilateral primary osteoarthritis, right hip: Secondary | ICD-10-CM | POA: Diagnosis not present

## 2022-03-09 ENCOUNTER — Other Ambulatory Visit: Payer: Self-pay | Admitting: Family Medicine

## 2022-03-09 DIAGNOSIS — M170 Bilateral primary osteoarthritis of knee: Secondary | ICD-10-CM

## 2022-04-02 ENCOUNTER — Other Ambulatory Visit: Payer: Self-pay | Admitting: Cardiology

## 2022-04-02 ENCOUNTER — Other Ambulatory Visit: Payer: Self-pay | Admitting: Family Medicine

## 2022-04-02 DIAGNOSIS — I4891 Unspecified atrial fibrillation: Secondary | ICD-10-CM

## 2022-04-02 DIAGNOSIS — B351 Tinea unguium: Secondary | ICD-10-CM

## 2022-04-04 NOTE — Telephone Encounter (Signed)
Prescription refill request for Eliquis received. ?Indication:Afib ?Last office visit:2/23 ?Scr:0.9 ?Age: 88 ?Weight:65.5 kg ? ?Prescription refilled ? ?

## 2022-04-06 ENCOUNTER — Telehealth: Payer: Self-pay | Admitting: Family Medicine

## 2022-04-06 NOTE — Telephone Encounter (Signed)
Left message for patient to call back and schedule Medicare Annual Wellness Visit (AWV).  ? ?Please offer to do virtually or by telephone.  Left office number and my jabber [phone]. ? ?Last AWV:11/08/2018 ? ?Please schedule at anytime with Nurse Health Advisor. ?  ?

## 2022-04-22 ENCOUNTER — Other Ambulatory Visit: Payer: Self-pay

## 2022-04-22 ENCOUNTER — Telehealth: Payer: Self-pay | Admitting: Family Medicine

## 2022-04-22 DIAGNOSIS — Z8711 Personal history of peptic ulcer disease: Secondary | ICD-10-CM

## 2022-04-22 MED ORDER — PANTOPRAZOLE SODIUM 40 MG PO TBEC: DELAYED_RELEASE_TABLET | ORAL | 1 refills | Status: DC

## 2022-04-22 NOTE — Telephone Encounter (Signed)
Pharm: [facility]   pantoprazole (PROTONIX) 40 MG tablet [[id number]]

## 2022-04-22 NOTE — Telephone Encounter (Signed)
Refill sent.

## 2022-05-27 ENCOUNTER — Other Ambulatory Visit: Payer: Self-pay | Admitting: Family Medicine

## 2022-05-27 DIAGNOSIS — B351 Tinea unguium: Secondary | ICD-10-CM

## 2022-07-18 ENCOUNTER — Telehealth: Payer: Self-pay | Admitting: Cardiology

## 2022-07-18 NOTE — Telephone Encounter (Signed)
Will route to PharmD.

## 2022-07-18 NOTE — Progress Notes (Addendum)
[place] at [facility] [address], [place], [place] [address] [phone]  Date:  07/20/2022   Name:  Kathy Thornton   DOB:  08-Aug-1934   MRN:  [id number]  PCP:  Darreld Mclean, MD    Chief Complaint: No chief complaint on file.   History of Present Illness:  Kathy Thornton is a 88 y.o. very pleasant female patient who presents with the following:  Patient seen today for periodic follow-up visit Most recently seen by myself in February History of breast cancer 2018, chronic arthritis pain, osteoporosis, chronic renal insufficiency, atrial fibrillation She had an ulcer in 2018 or 2019 due to NSAID use, we now use tramadol for her knee pain- her pain is "pretty bad," she has not had an injection in a year or so. The last couple of shots in the right knee did not help  She saw cardiology most recently in February; no major changes made Mammogram October 2022, negative Bone density January 2021, can offer update  Most recent blood work in February, BMP, lipid  She does check her BP at home and it tends to run 128/78, or lower  Besides arthritis pain Denyse Amass is doing quite well.  They made a large garden this season and are doing quite a bit of canning  She has been taking terbinafine for onychomycosis, she has taken it longer than recommended by accident, has been on this medicine for about 6 months.  Advised she can stop using it now, we will check her liver function Her toenails are growing out clear  Toprol-XL 25 Tramadol as needed Eliquis 5 twice daily Lasix/potassium chloride as needed Pantoprazole Patient Active Problem List   Diagnosis Date Noted   Knee pain 05/17/2021   Cataract 05/17/2021   Genetic testing 03/27/2018   Family history of colon cancer    Family history of breast cancer    Malignant neoplasm of upper-inner quadrant of left breast in female, estrogen receptor positive ([place]) 01/02/2018    Osteoporosis 01/02/2018   Cameron lesion, acute: Per EGD 11/06/2015 11/06/2015   Erosive gastritis: per EGD 11/05/2105 11/06/2015   Hemorrhoids; Grade 1 per colonoscopy 11/06/2015 11/06/2015   Anemia, iron deficiency    Acute gastric ulcer    Anemia 11/05/2015   Symptomatic anemia 11/05/2015   Heart murmur 11/05/2015   Lower urinary tract infectious disease 11/05/2015   Lower extremity edema 11/05/2015   Arthritis of both knees 11/04/2015    Past Medical History:  Diagnosis Date   Breast cancer ([place]) 2019   Left Breast Cancer   Breast cancer in female [facility])    Left   Cameron lesion, acute: Per EGD 11/06/2015 11/06/2015   Cataract    Erosive gastritis: per EGD 11/05/2105 11/06/2015   Family history of breast cancer    Family history of colon cancer    Heart murmur    Hemorrhoids; Grade 1 per colonoscopy 11/06/2015 11/06/2015   Knee pain     Past Surgical History:  Procedure Laterality Date   BREAST LUMPECTOMY Left 03/30/2018   BREAST LUMPECTOMY WITH RADIOACTIVE SEED LOCALIZATION Left 03/30/2018   Procedure: RADIOACTIVE SEED GUIDED LEFT BREAST LUMPECTOMY;  Surgeon: Fanny Skates, MD;  Location: [facility];  Service: General;  Laterality: Left;   COLONOSCOPY WITH PROPOFOL N/A 11/06/2015   Procedure: COLONOSCOPY WITH PROPOFOL;  Surgeon: Ladene Artist, MD;  [facility];  Service: Endoscopy;  Laterality: N/A;   ESOPHAGOGASTRODUODENOSCOPY (EGD) WITH PROPOFOL  N/A 11/06/2015   Procedure: ESOPHAGOGASTRODUODENOSCOPY (EGD) WITH PROPOFOL;  Surgeon: Ladene Artist, MD;  [facility];  Service: Endoscopy;  Laterality: N/A;   EYE SURGERY      Social History   Tobacco Use   Smoking status: Never   Smokeless tobacco: Never  Vaping Use   Vaping Use: Never used  Substance Use Topics   Alcohol use: No   Drug use: No    Family History  Problem Relation Age of Onset   Aneurysm Mother        brain   Heart disease Father    Heart disease Brother    AAA (abdominal aortic  aneurysm) Maternal Uncle    Colon cancer Paternal Uncle        dx >50   Breast cancer Cousin        pat cousin, dx in her late 44s    Allergies  Allergen Reactions   Sulfa Antibiotics Hives    Medication list has been reviewed and updated.  Current Outpatient Medications on File Prior to Visit  Medication Sig Dispense Refill   Ascorbic Acid (VITAMIN C) 500 MG CAPS Take 500 mg by mouth daily.     Cholecalciferol (VITAMIN D3 PO) Take by mouth daily.     Cinnamon 500 MG capsule Take 1 capsule (500 mg total) by mouth daily.     ELIQUIS 5 MG TABS tablet TAKE 1 TABLET(5 MG) BY MOUTH TWICE DAILY 60 tablet 5   metoprolol succinate (TOPROL-XL) 25 MG 24 hr tablet TAKE 1 TABLET(25 MG) BY MOUTH DAILY 90 tablet 1   Multiple Vitamins-Minerals (WOMENS MULTIVITAMIN PO) Take by mouth.     pantoprazole (PROTONIX) 40 MG tablet Take 1 tab by mouth daily. 90 tablet 1   traMADol (ULTRAM) 50 MG tablet TAKE 1 TABLET BY MOUTH EVERY 8 HOURS AS NEEDED FOR MODERATE PAIN 90 tablet 1   Trolamine Salicylate (ASPERCREME EX) Apply 1 application topically daily as needed (pain).     furosemide (LASIX) 20 MG tablet Take 1 tablet (20 mg total) by mouth every 14 (fourteen) days. 7 tablet 3   potassium chloride (KLOR-CON) 10 MEQ tablet Take 1 tablet (10 mEq total) by mouth every 14 (fourteen) days. 7 tablet 3   No current facility-administered medications on file prior to visit.    Review of Systems:  As per HPI- otherwise negative.   Physical Examination: Vitals:   07/20/22 1355 07/20/22 1412  BP: (!) 163/56 120/70  Pulse: (!) 57   Resp: 16   Temp: 98.1 F (36.7 C)   SpO2: 100%    Vitals:   07/20/22 1355  Weight: 149 lb (67.6 kg)   Body mass index is 32.24 kg/m. Ideal Body Weight:    GEN: no acute distress.  Mild obesity, looks well  HEENT: Atraumatic, Normocephalic.  Ears and Nose: No external deformity. CV: RRR, No M/G/R. No JVD. No thrill. No extra heart sounds. PULM: CTA B, no wheezes,  crackles, rhonchi. No retractions. No resp. distress. No accessory muscle use. ABD: S, NT, ND, +BS. No rebound. No HSM. EXTR: No c/c/e PSYCH: Normally interactive. Conversant.  Onychomycosis with clearing at proximal cuticle  Assessment and Plan: Atrial fibrillation, unspecified type ([place]) - Plan: CBC, TSH, Comprehensive metabolic panel  Arthritis of both knees  Iron deficiency anemia, unspecified iron deficiency anemia type  Other osteoporosis without current pathological fracture - Plan: DG Bone Density  Malignant neoplasm of upper-inner quadrant of left breast in female, estrogen receptor positive ([place])  Onychomycosis  Seen today for follow-up.  Ordered bone density Follow-up on blood counts and thyroid Onycho in mycosis is clearing up, check liver tests as she inadvertently stayed on treatment for longer than recommended Mammogram is up-to-date  Assuming all is well, plan to recheck in 6 months  Signed Lamar Blinks, MD  Received labs as below, 8/19 Colonoscopy 2016 Letter to patient Results for orders placed or performed in visit on 07/20/22  CBC  Result Value Ref Range   WBC 6.6 4.0 - 10.5 K/uL   RBC 3.64 (L) 3.87 - 5.11 Mil/uL   Platelets 189.0 150.0 - 400.0 K/uL   Hemoglobin 11.2 (L) 12.0 - 15.0 g/dL   HCT 33.6 (L) 36.0 - 46.0 %   MCV 92.3 78.0 - 100.0 fl   MCHC 33.3 30.0 - 36.0 g/dL   RDW 13.7 11.5 - 15.5 %  TSH  Result Value Ref Range   TSH 1.82 0.35 - 5.50 uIU/mL  Comprehensive metabolic panel  Result Value Ref Range   Sodium 140 135 - 145 mEq/L   Potassium 4.4 3.5 - 5.1 mEq/L   Chloride 106 96 - 112 mEq/L   CO2 25 19 - 32 mEq/L   Glucose, Bld 91 70 - 99 mg/dL   BUN 16 6 - 23 mg/dL   Creatinine, Ser 1.05 0.40 - 1.20 mg/dL   Total Bilirubin 0.3 0.2 - 1.2 mg/dL   Alkaline Phosphatase 80 39 - 117 U/L   AST 14 0 - 37 U/L   ALT 7 0 - 35 U/L   Total Protein 6.7 6.0 - 8.3 g/dL   Albumin 4.3 3.5 - 5.2 g/dL   GFR 48.13 (L) >60.00 mL/min   Calcium 9.1  8.4 - 10.5 mg/dL

## 2022-07-18 NOTE — Telephone Encounter (Signed)
Patient calling the office for samples of medication:   1.  What medication and dosage are you requesting samples for? Eliquis  2.  Are you currently out of this medication?  In the donut hole

## 2022-07-18 NOTE — Telephone Encounter (Signed)
She will be in the donut hole through the end of the year, cannot sample her indefinitely for the next 4-5 months. If long term copay in the donut hole is unaffordable, pt should fill out pt assistance through [employer] to see if she qualifies for free medication. Application found at: [employer].com.br.66cbc25e.pdf  Would be ok to provide pt with 1-2 weeks of samples while pt assistance form is being filled out. She is taking the correct dose of Eliquis based on her age, weight, and renal function.

## 2022-07-18 NOTE — Patient Instructions (Incomplete)
It was great to see you today, I will be in touch with your lab work as soon as possible.  Assuming all is well please see me in about 6 months  I would suggest getting a few immunizations at your pharmacy Flu shot COVID booster Tetanus Shingrix  STOP terbinafine at this point - toenails will continue to grow out

## 2022-07-18 NOTE — Telephone Encounter (Signed)
Attempted to call patient, left message for patient to call back to office.   Samples with patient assistance forms left at front desk for pick up.

## 2022-07-18 NOTE — Telephone Encounter (Signed)
Patient came into office and picked up samples and patient assistance information.  Thanks!

## 2022-07-20 ENCOUNTER — Ambulatory Visit (INDEPENDENT_AMBULATORY_CARE_PROVIDER_SITE_OTHER): Payer: Medicare Other | Admitting: Family Medicine

## 2022-07-20 ENCOUNTER — Encounter: Payer: Self-pay | Admitting: Family Medicine

## 2022-07-20 VITALS — BP 120/70 | HR 57 | Temp 98.1°F | Resp 16 | Wt 149.0 lb

## 2022-07-20 DIAGNOSIS — D649 Anemia, unspecified: Secondary | ICD-10-CM | POA: Diagnosis not present

## 2022-07-20 DIAGNOSIS — D509 Iron deficiency anemia, unspecified: Secondary | ICD-10-CM

## 2022-07-20 DIAGNOSIS — M170 Bilateral primary osteoarthritis of knee: Secondary | ICD-10-CM

## 2022-07-20 DIAGNOSIS — B351 Tinea unguium: Secondary | ICD-10-CM | POA: Diagnosis not present

## 2022-07-20 DIAGNOSIS — I4891 Unspecified atrial fibrillation: Secondary | ICD-10-CM | POA: Diagnosis not present

## 2022-07-20 DIAGNOSIS — M818 Other osteoporosis without current pathological fracture: Secondary | ICD-10-CM | POA: Diagnosis not present

## 2022-07-20 DIAGNOSIS — C50212 Malignant neoplasm of upper-inner quadrant of left female breast: Secondary | ICD-10-CM | POA: Diagnosis not present

## 2022-07-20 DIAGNOSIS — Z170 Estrogen receptor positive status [ER+]: Secondary | ICD-10-CM

## 2022-07-21 LAB — CBC
HCT: 33.6 % — ABNORMAL LOW (ref 36.0–46.0)
Hemoglobin: 11.2 g/dL — ABNORMAL LOW (ref 12.0–15.0)
MCHC: 33.3 g/dL (ref 30.0–36.0)
MCV: 92.3 fl (ref 78.0–100.0)
Platelets: 189 10*3/uL (ref 150.0–400.0)
RBC: 3.64 Mil/uL — ABNORMAL LOW (ref 3.87–5.11)
RDW: 13.7 % (ref 11.5–15.5)
WBC: 6.6 10*3/uL (ref 4.0–10.5)

## 2022-07-21 LAB — COMPREHENSIVE METABOLIC PANEL
ALT: 7 U/L (ref 0–35)
AST: 14 U/L (ref 0–37)
Albumin: 4.3 g/dL (ref 3.5–5.2)
Alkaline Phosphatase: 80 U/L (ref 39–117)
BUN: 16 mg/dL (ref 6–23)
CO2: 25 mEq/L (ref 19–32)
Calcium: 9.1 mg/dL (ref 8.4–10.5)
Chloride: 106 mEq/L (ref 96–112)
Creatinine, Ser: 1.05 mg/dL (ref 0.40–1.20)
GFR: 48.13 mL/min — ABNORMAL LOW (ref >60.00)
Glucose, Bld: 91 mg/dL (ref 70–99)
Potassium: 4.4 mEq/L (ref 3.5–5.1)
Sodium: 140 mEq/L (ref 135–145)
Total Bilirubin: 0.3 mg/dL (ref 0.2–1.2)
Total Protein: 6.7 g/dL (ref 6.0–8.3)

## 2022-07-21 LAB — TSH: TSH: 1.82 u[IU]/mL (ref 0.35–5.50)

## 2022-07-23 NOTE — Addendum Note (Signed)
Addended by: Lamar Blinks C on: 07/23/2022 05:02 PM   Modules accepted: Orders

## 2022-07-25 NOTE — Progress Notes (Signed)
Mailed

## 2022-07-29 ENCOUNTER — Telehealth: Payer: Self-pay | Admitting: Pharmacist

## 2022-07-29 NOTE — Telephone Encounter (Signed)
Received the message that patient's husband needed assistance with Eliquis cost. When I called to speak to patient's daughter she mentioned that Kathy Thornton needed assistance with Eliquis as well.  Spoke with patient's daughter. Patient currently has about 20 days of Eliquis '5mg'$ .  Screened for medication assistance program. Patient should qualify based on reported household income of < 300% [employer].  Mailed application for Eliquis medication assistance program to patient.  Daughter is aware to include proof of household income and proof of out of pocket prescription expense of 3% or more of household income.  Also included 30 days free coupon cards. Daughter is aware they can only use these once per lifetime and will not be about to use if they have had one in the past.

## 2022-08-03 ENCOUNTER — Other Ambulatory Visit: Payer: Self-pay | Admitting: Hematology and Oncology

## 2022-08-03 DIAGNOSIS — Z1231 Encounter for screening mammogram for malignant neoplasm of breast: Secondary | ICD-10-CM

## 2022-08-12 ENCOUNTER — Ambulatory Visit (INDEPENDENT_AMBULATORY_CARE_PROVIDER_SITE_OTHER): Payer: Medicare Other | Admitting: *Deleted

## 2022-08-12 VITALS — BP 109/69 | HR 61 | Ht <= 58 in | Wt 140.0 lb

## 2022-08-12 DIAGNOSIS — Z0000 Encounter for general adult medical examination without abnormal findings: Secondary | ICD-10-CM | POA: Diagnosis not present

## 2022-08-12 NOTE — Progress Notes (Signed)
Subjective:   Kathy Thornton is a 88 y.o. female who presents for Medicare Annual (Subsequent) preventive examination.  I connected with  Kathy Thornton on 08/12/22 by a audio enabled telemedicine application and verified that I am speaking with the correct person using two identifiers.  Patient Location: Home  Provider Location: Office/Clinic  I discussed the limitations of evaluation and management by telemedicine. The patient expressed understanding and agreed to proceed.   Review of Systems    Defer to PCP Cardiac Risk Factors include: advanced age (>25mn, >>41women);sedentary lifestyle     Objective:    Today's Vitals   08/12/22 1341  PainSc: 8    There is no height or weight on file to calculate BMI.     08/12/2022    1:52 PM 10/13/2019    2:00 PM 11/08/2018    2:03 PM 03/27/2018    2:01 PM 03/23/2017    4:09 PM 02/07/2016   10:28 AM 11/04/2015   11:05 PM  Advanced Directives  Does Patient Have a Medical Advance Directive? No No No No No No No  Would patient like information on creating a medical advance directive? No - Patient declined  No - Patient declined No - Patient declined No - Patient declined No - patient declined information     Current Medications (verified) Outpatient Encounter Medications as of 08/12/2022  Medication Sig   Ascorbic Acid (VITAMIN C) 500 MG CAPS Take 500 mg by mouth daily.   Cholecalciferol (VITAMIN D3 PO) Take by mouth daily.   Cinnamon 500 MG capsule Take 1 capsule (500 mg total) by mouth daily.   ELIQUIS 5 MG TABS tablet TAKE 1 TABLET(5 MG) BY MOUTH TWICE DAILY   furosemide (LASIX) 20 MG tablet Take 1 tablet (20 mg total) by mouth every 14 (fourteen) days.   metoprolol succinate (TOPROL-XL) 25 MG 24 hr tablet TAKE 1 TABLET(25 MG) BY MOUTH DAILY   Multiple Vitamins-Minerals (WOMENS MULTIVITAMIN PO) Take by mouth.   pantoprazole (PROTONIX) 40 MG tablet Take 1 tab by mouth daily.   potassium chloride (KLOR-CON) 10 MEQ tablet Take 1  tablet (10 mEq total) by mouth every 14 (fourteen) days.   traMADol (ULTRAM) 50 MG tablet TAKE 1 TABLET BY MOUTH EVERY 8 HOURS AS NEEDED FOR MODERATE PAIN   Trolamine Salicylate (ASPERCREME EX) Apply 1 application topically daily as needed (pain).   No facility-administered encounter medications on file as of 08/12/2022.    Allergies (verified) Sulfa antibiotics   History: Past Medical History:  Diagnosis Date   Breast cancer ([facility] 2019   Left Breast Cancer   Breast cancer in female ([facility]    Left   [place] lesion, acute: Per EGD 11/06/2015 11/06/2015   Cataract    Erosive gastritis: per EGD 11/05/2105 11/06/2015   Family history of breast cancer    Family history of colon cancer    Heart murmur    Hemorrhoids; Grade 1 per colonoscopy 11/06/2015 11/06/2015   Knee pain    Past Surgical History:  Procedure Laterality Date   BREAST LUMPECTOMY Left 03/30/2018   BREAST LUMPECTOMY WITH RADIOACTIVE SEED LOCALIZATION Left 03/30/2018   Procedure: RADIOACTIVE SEED GUIDED LEFT BREAST LUMPECTOMY;  Surgeon: IFanny Skates MD;  [facility]  Service: General;  Laterality: Left;   COLONOSCOPY WITH PROPOFOL N/A 11/06/2015   Procedure: COLONOSCOPY WITH PROPOFOL;  Surgeon: MLadene Artist MD;  Location: [facility];  Service: Endoscopy;  Laterality: N/A;   ESOPHAGOGASTRODUODENOSCOPY (EGD) WITH PROPOFOL N/A 11/06/2015  Procedure: ESOPHAGOGASTRODUODENOSCOPY (EGD) WITH PROPOFOL;  Surgeon: Ladene Artist, MD;  Location: [facility];  Service: Endoscopy;  Laterality: N/A;   EYE SURGERY     Family History  Problem Relation Age of Onset   Aneurysm Mother        brain   Heart disease Father    Heart disease Brother    AAA (abdominal aortic aneurysm) Maternal Uncle    Colon cancer Paternal Uncle        dx >50   Breast cancer Cousin        pat cousin, dx in her late 35s   Social History   Socioeconomic History   Marital status: Married    Spouse name: Not on file   Number of children: Not on  file   Years of education: Not on file   Highest education level: Not on file  Occupational History   Not on file  Tobacco Use   Smoking status: Never   Smokeless tobacco: Never  Vaping Use   Vaping Use: Never used  Substance and Sexual Activity   Alcohol use: No   Drug use: No   Sexual activity: Not on file  Other Topics Concern   Not on file  Social History Narrative   Not on file   Social Determinants of Health   Financial Resource Strain: Low Risk  (08/12/2022)   Overall Financial Resource Strain (CARDIA)    Difficulty of Paying Living Expenses: Not hard at all  Food Insecurity: No Food Insecurity (08/12/2022)   Hunger Vital Sign    Worried About Running Out of Food in the Last Year: Never true    [place] in the Last Year: Never true  Transportation Needs: No Transportation Needs (08/12/2022)   PRAPARE - Hydrologist (Medical): No    Lack of Transportation (Non-Medical): No  Physical Activity: Inactive (08/12/2022)   Exercise Vital Sign    Days of Exercise per Week: 0 days    Minutes of Exercise per Session: 0 min  Stress: No Stress Concern Present (08/12/2022)   [place]    Feeling of Stress : Not at all  Social Connections: Moderately Integrated (08/12/2022)   Social Connection and Isolation Panel [NHANES]    Frequency of Communication with Friends and Family: More than three times a week    Frequency of Social Gatherings with Friends and Family: Twice a week    Attends Religious Services: 1 to 4 times per year    Active Member of [employer] or Organizations: No    Attends Music therapist: Never    Marital Status: Married    Tobacco Counseling Counseling given: Not Answered   Clinical Intake:  Pre-visit preparation completed: Yes  Pain : 0-10 Pain Score: 8  Pain Location: Knee Pain Orientation: Right, Left Pain Descriptors / Indicators: Aching Pain  Onset: More than a month ago Pain Frequency: Intermittent     Diabetes: No  How often do you need to have someone help you when you read instructions, pamphlets, or other written materials from your doctor or pharmacy?: 1 - Never  Diabetic? No  Interpreter Needed?: No  Information entered by :: Beatris Ship, [place] of Daily Living    08/12/2022    1:47 PM  In your present state of health, do you have any difficulty performing the following activities:  Hearing? 1  Vision? 0  Difficulty concentrating or  making decisions? 0  Walking or climbing stairs? 1  Comment knee and hip arthritis  Dressing or bathing? 0  Doing errands, shopping? 0  Preparing Food and eating ? N  Using the Toilet? N  In the past six months, have you accidently leaked urine? Y  Comment every once in a while in the mornings  Do you have problems with loss of bowel control? N  Managing your Medications? N  Managing your Finances? N  Housekeeping or managing your Housekeeping? N    Patient Care Team: Copland, Gay Filler, MD as PCP - General (Family Medicine) Berniece Salines, DO as PCP - Cardiology (Cardiology) Earlie Server, MD as Consulting Physician (Orthopedic Surgery) Gatha Mayer, MD as Consulting Physician (Gastroenterology) Magrinat, Virgie Dad, MD (Inactive) as Consulting Physician (Oncology) Fanny Skates, MD as Consulting Physician (General Surgery) Berniece Salines, DO as Consulting Physician (Cardiology)  Indicate any recent Medical Services you may have received from other than [facility] providers in the past year (date may be approximate).     Assessment:   This is a routine wellness examination for [place].  Hearing/Vision screen No results found.  Dietary issues and exercise activities discussed: Current Exercise Habits: The patient does not participate in regular exercise at present, Exercise limited by: orthopedic condition(s)   Goals Addressed   None    Depression  Screen    08/12/2022    1:47 PM 05/12/2021    1:03 PM 11/08/2018    2:06 PM 11/08/2018    1:41 PM 04/25/2018    1:46 PM 03/23/2017    4:10 PM 01/19/2016   10:17 AM  PHQ 2/9 Scores  PHQ - 2 Score 0 0 0 0 0 0 0    Fall Risk    08/12/2022    1:43 PM 07/20/2022    1:55 PM 05/12/2021    1:03 PM 11/11/2020    1:17 PM 11/08/2018    2:06 PM  [place] in the past year? 0 0 0 0 0  Number falls in past yr: 0 0 0 0   Injury with Fall? 0 0 0 0   Risk for fall due to : No Fall Risks      Follow up Falls evaluation completed        [place]:  Any stairs in or around the home? Yes  If so, are there any without handrails? No  Home free of loose throw rugs in walkways, pet beds, electrical cords, etc? Yes  Adequate lighting in your home to reduce risk of falls? Yes   ASSISTIVE DEVICES UTILIZED TO PREVENT FALLS:  Life alert? No  Use of a cane, walker or w/c? Yes  Grab bars in the bathroom? Yes  Shower chair or bench in shower? Yes  Elevated toilet seat or a handicapped toilet? Yes     Cognitive Function:    03/23/2017    4:11 PM  MMSE - Mini Mental State Exam  Orientation to time 5  Orientation to Place 5  Registration 3  Attention/ Calculation 2  Recall 2  Language- name 2 objects 2  Language- repeat 1  Language- follow 3 step command 3  Language- read & follow direction 1  Write a sentence 1  Copy design 1  Total score 26        08/12/2022    1:53 PM  6CIT Screen  What Year? 0 points  What month? 0 points  What time? 0 points  Count back from 20 0 points  Months in reverse 0 points  Repeat phrase 0 points  Total Score 0 points    Immunizations Immunization History  Administered Date(s) Administered   Fluad Quad(high Dose 65+) 08/29/2019, 09/02/2020, 09/15/2021   Influenza, High Dose Seasonal PF 08/22/2016, 10/04/2017, 10/15/2018   Influenza,inj,Quad PF,6+ Mos 11/04/2015   PFIZER(Purple Top)SARS-COV-2 Vaccination 07/05/2020,  07/19/2020   Pneumococcal Conjugate-13 11/04/2015   Pneumococcal Polysaccharide-23 10/04/2017   Tdap 06/09/2012    TDAP status: Due, Education has been provided regarding the importance of this vaccine. Advised may receive this vaccine at local pharmacy or Health Dept. Aware to provide a copy of the vaccination record if obtained from local pharmacy or Health Dept. Verbalized acceptance and understanding.  Flu Vaccine status: Due, Education has been provided regarding the importance of this vaccine. Advised may receive this vaccine at local pharmacy or Health Dept. Aware to provide a copy of the vaccination record if obtained from local pharmacy or Health Dept. Verbalized acceptance and understanding.  Pneumococcal vaccine status: Up to date  Covid-19 vaccine status: Information provided on how to obtain vaccines.   Qualifies for Shingles Vaccine? Yes   Zostavax completed No   Shingrix Completed?: No.    Education has been provided regarding the importance of this vaccine. Patient has been advised to call insurance company to determine out of pocket expense if they have not yet received this vaccine. Advised may also receive vaccine at local pharmacy or Health Dept. Verbalized acceptance and understanding.  Screening Tests Health Maintenance  Topic Date Due   Zoster Vaccines- Shingrix (1 of 2) Never done   COVID-19 Vaccine (3 - Pfizer risk series) 08/16/2020   TETANUS/TDAP  06/09/2022   INFLUENZA VACCINE  07/05/2022   Pneumonia Vaccine 48+ Years old  Completed   DEXA SCAN  Completed   HPV VACCINES  Aged Out    Health Maintenance  Health Maintenance Due  Topic Date Due   Zoster Vaccines- Shingrix (1 of 2) Never done   COVID-19 Vaccine (3 - Pfizer risk series) 08/16/2020   TETANUS/TDAP  06/09/2022   INFLUENZA VACCINE  07/05/2022    Colorectal cancer screening: No longer required.   Mammogram status: Ordered Pt is scheduled for 09/21/22. Pt provided with contact info and  advised to call to schedule appt.   Bone Density status: Ordered Pt is scheduled on 08/29/22. Pt provided with contact info and advised to call to schedule appt.  Lung Cancer Screening: (Low Dose CT Chest recommended if Age 82-80 years, 30 pack-year currently smoking OR have quit w/in 15years.) does not qualify.   Lung Cancer Screening Referral: N/a  Additional Screening:  Hepatitis C Screening: does qualify; Completed N/a  Vision Screening: Recommended annual ophthalmology exams for early detection of glaucoma and other disorders of the eye. Is the patient up to date with their annual eye exam?  No  Who is the provider or what is the name of the office in which the patient attends annual eye exams? Dr. Tommy Rainwater If pt is not established with a provider, would they like to be referred to a provider to establish care? No .   Dental Screening: Recommended annual dental exams for proper oral hygiene  Community Resource Referral / Chronic Care Management: CRR required this visit?  No   CCM required this visit?  No      Plan:     I have personally reviewed and noted the following in the patient's chart:   Medical and social  history Use of alcohol, tobacco or illicit drugs  Current medications and supplements including opioid prescriptions. Patient is not currently taking opioid prescriptions. Functional ability and status Nutritional status Physical activity Advanced directives List of other physicians Hospitalizations, surgeries, and ER visits in previous 12 months Vitals Screenings to include cognitive, depression, and falls Referrals and appointments  In addition, I have reviewed and discussed with patient certain preventive protocols, quality metrics, and best practice recommendations. A written personalized care plan for preventive services as well as general preventive health recommendations were provided to patient.   Due to this being a telephonic visit, the after visit  summary with patients personalized plan was offered to patient via mail or my-chart. Per request, patient was mailed a copy of AVS.  Beatris Ship, [place]   08/12/2022   Nurse Notes: None

## 2022-08-12 NOTE — Patient Instructions (Signed)
Kathy Thornton , Thank you for taking time to come for your Medicare Wellness Visit. I appreciate your ongoing commitment to your health goals. Please review the following plan we discussed and let me know if I can assist you in the future.   These are the goals we discussed:  Goals      Increase physical activity     Per pt---states she would like to be 135lb        This is a list of the screening recommended for you and due dates:  Health Maintenance  Topic Date Due   Zoster (Shingles) Vaccine (1 of 2) Never done   COVID-19 Vaccine (3 - Pfizer risk series) 08/16/2020   Tetanus Vaccine  06/09/2022   Flu Shot  07/05/2022   Pneumonia Vaccine  Completed   DEXA scan (bone density measurement)  Completed   HPV Vaccine  Aged Out       Next appointment: Follow up in one year for your annual wellness visit    Preventive Care 86 Years and Older, Female Preventive care refers to lifestyle choices and visits with your health care provider that can promote health and wellness. What does preventive care include? A yearly physical exam. This is also called an annual well check. Dental exams once or twice a year. Routine eye exams. Ask your health care provider how often you should have your eyes checked. Personal lifestyle choices, including: Daily care of your teeth and gums. Regular physical activity. Eating a healthy diet. Avoiding tobacco and drug use. Limiting alcohol use. Practicing safe sex. Taking low-dose aspirin every day. Taking vitamin and mineral supplements as recommended by your health care provider. What happens during an annual well check? The services and screenings done by your health care provider during your annual well check will depend on your age, overall health, lifestyle risk factors, and family history of disease. Counseling  Your health care provider may ask you questions about your: Alcohol use. Tobacco use. Drug use. Emotional well-being. Home and  relationship well-being. Sexual activity. Eating habits. History of falls. Memory and ability to understand (cognition). Work and work Statistician. Reproductive health. Screening  You may have the following tests or measurements: Height, weight, and BMI. Blood pressure. Lipid and cholesterol levels. These may be checked every 5 years, or more frequently if you are over 86 years old. Skin check. Lung cancer screening. You may have this screening every year starting at age 14 if you have a 30-pack-year history of smoking and currently smoke or have quit within the past 15 years. Fecal occult blood test (FOBT) of the stool. You may have this test every year starting at age 29. Flexible sigmoidoscopy or colonoscopy. You may have a sigmoidoscopy every 5 years or a colonoscopy every 10 years starting at age 67. Hepatitis C blood test. Hepatitis B blood test. Sexually transmitted disease (STD) testing. Diabetes screening. This is done by checking your blood sugar (glucose) after you have not eaten for a while (fasting). You may have this done every 1-3 years. Bone density scan. This is done to screen for osteoporosis. You may have this done starting at age 86. Mammogram. This may be done every 1-2 years. Talk to your health care provider about how often you should have regular mammograms. Talk with your health care provider about your test results, treatment options, and if necessary, the need for more tests. Vaccines  Your health care provider may recommend certain vaccines, such as: Influenza vaccine. This is recommended  every year. Tetanus, diphtheria, and acellular pertussis (Tdap, Td) vaccine. You may need a Td booster every 10 years. Zoster vaccine. You may need this after age 36. Pneumococcal 13-valent conjugate (PCV13) vaccine. One dose is recommended after age 86. Pneumococcal polysaccharide (PPSV23) vaccine. One dose is recommended after age 86. Talk to your health care provider  about which screenings and vaccines you need and how often you need them. This information is not intended to replace advice given to you by your health care provider. Make sure you discuss any questions you have with your health care provider. Document Released: 12/18/2015 Document Revised: 08/10/2016 Document Reviewed: 09/22/2015 Elsevier Interactive Patient Education  2017 [employer] Prevention in the Home Falls can cause injuries. They can happen to people of all ages. There are many things you can do to make your home safe and to help prevent falls. What can I do on the outside of my home? Regularly fix the edges of walkways and driveways and fix any cracks. Remove anything that might make you trip as you walk through a door, such as a raised step or threshold. Trim any bushes or trees on the path to your home. Use bright outdoor lighting. Clear any walking paths of anything that might make someone trip, such as rocks or tools. Regularly check to see if handrails are loose or broken. Make sure that both sides of any steps have handrails. Any raised decks and porches should have guardrails on the edges. Have any leaves, snow, or ice cleared regularly. Use sand or salt on walking paths during winter. Clean up any spills in your garage right away. This includes oil or grease spills. What can I do in the bathroom? Use night lights. Install grab bars by the toilet and in the tub and shower. Do not use towel bars as grab bars. Use non-skid mats or decals in the tub or shower. If you need to sit down in the shower, use a plastic, non-slip stool. Keep the floor dry. Clean up any water that spills on the floor as soon as it happens. Remove soap buildup in the tub or shower regularly. Attach bath mats securely with double-sided non-slip rug tape. Do not have throw rugs and other things on the floor that can make you trip. What can I do in the bedroom? Use night lights. Make sure  that you have a light by your bed that is easy to reach. Do not use any sheets or blankets that are too big for your bed. They should not hang down onto the floor. Have a firm chair that has side arms. You can use this for support while you get dressed. Do not have throw rugs and other things on the floor that can make you trip. What can I do in the kitchen? Clean up any spills right away. Avoid walking on wet floors. Keep items that you use a lot in easy-to-reach places. If you need to reach something above you, use a strong step stool that has a grab bar. Keep electrical cords out of the way. Do not use floor polish or wax that makes floors slippery. If you must use wax, use non-skid floor wax. Do not have throw rugs and other things on the floor that can make you trip. What can I do with my stairs? Do not leave any items on the stairs. Make sure that there are handrails on both sides of the stairs and use them. Fix handrails that are broken  or loose. Make sure that handrails are as long as the stairways. Check any carpeting to make sure that it is firmly attached to the stairs. Fix any carpet that is loose or worn. Avoid having throw rugs at the top or bottom of the stairs. If you do have throw rugs, attach them to the floor with carpet tape. Make sure that you have a light switch at the top of the stairs and the bottom of the stairs. If you do not have them, ask someone to add them for you. What else can I do to help prevent falls? Wear shoes that: Do not have high heels. Have rubber bottoms. Are comfortable and fit you well. Are closed at the toe. Do not wear sandals. If you use a stepladder: Make sure that it is fully opened. Do not climb a closed stepladder. Make sure that both sides of the stepladder are locked into place. Ask someone to hold it for you, if possible. Clearly mark and make sure that you can see: Any grab bars or handrails. First and last steps. Where the edge of  each step is. Use tools that help you move around (mobility aids) if they are needed. These include: Canes. Walkers. Scooters. Crutches. Turn on the lights when you go into a dark area. Replace any light bulbs as soon as they burn out. Set up your furniture so you have a clear path. Avoid moving your furniture around. If any of your floors are uneven, fix them. If there are any pets around you, be aware of where they are. Review your medicines with your doctor. Some medicines can make you feel dizzy. This can increase your chance of falling. Ask your doctor what other things that you can do to help prevent falls. This information is not intended to replace advice given to you by your health care provider. Make sure you discuss any questions you have with your health care provider. Document Released: 09/17/2009 Document Revised: 04/28/2016 Document Reviewed: 12/26/2014 Elsevier Interactive Patient Education  2017 [employer].

## 2022-08-13 ENCOUNTER — Other Ambulatory Visit: Payer: Self-pay | Admitting: Family Medicine

## 2022-08-13 DIAGNOSIS — B351 Tinea unguium: Secondary | ICD-10-CM

## 2022-08-17 ENCOUNTER — Other Ambulatory Visit: Payer: Self-pay | Admitting: Family Medicine

## 2022-08-17 DIAGNOSIS — M170 Bilateral primary osteoarthritis of knee: Secondary | ICD-10-CM

## 2022-08-29 ENCOUNTER — Ambulatory Visit (HOSPITAL_BASED_OUTPATIENT_CLINIC_OR_DEPARTMENT_OTHER)
Admission: RE | Admit: 2022-08-29 | Discharge: 2022-08-29 | Disposition: A | Discharge status: Home / Self Care | Payer: Medicare Other | Source: Ambulatory Visit | Attending: Family Medicine | Admitting: Family Medicine

## 2022-08-29 DIAGNOSIS — M810 Age-related osteoporosis without current pathological fracture: Secondary | ICD-10-CM | POA: Diagnosis not present

## 2022-08-29 DIAGNOSIS — M818 Other osteoporosis without current pathological fracture: Secondary | ICD-10-CM | POA: Diagnosis not present

## 2022-08-31 ENCOUNTER — Telehealth: Payer: Self-pay | Admitting: Family Medicine

## 2022-08-31 NOTE — Telephone Encounter (Signed)
Received her updated bone density scan, osteoporosis is present Patient's daughter came in today with her husband- gave a copy of recent Dexa, asked Kathy Thornton to please ask her mother about treatment.  She had some joint pains with fosamax in the past- can consider other options if she is interested

## 2022-09-21 ENCOUNTER — Ambulatory Visit: Payer: Medicare Other

## 2022-09-26 ENCOUNTER — Other Ambulatory Visit: Payer: Self-pay | Admitting: *Deleted

## 2022-09-26 DIAGNOSIS — C50212 Malignant neoplasm of upper-inner quadrant of left female breast: Secondary | ICD-10-CM

## 2022-09-27 ENCOUNTER — Encounter: Payer: Self-pay | Admitting: Hematology and Oncology

## 2022-09-27 ENCOUNTER — Inpatient Hospital Stay: Payer: Medicare Other

## 2022-09-27 ENCOUNTER — Other Ambulatory Visit: Payer: Self-pay

## 2022-09-27 ENCOUNTER — Inpatient Hospital Stay: Payer: Medicare Other | Attending: Hematology and Oncology

## 2022-09-27 ENCOUNTER — Inpatient Hospital Stay: Payer: Medicare Other | Admitting: Hematology and Oncology

## 2022-09-27 VITALS — BP 155/61 | HR 63 | Temp 97.9°F | Resp 16 | Ht <= 58 in | Wt 153.8 lb

## 2022-09-27 DIAGNOSIS — Z8719 Personal history of other diseases of the digestive system: Secondary | ICD-10-CM | POA: Diagnosis not present

## 2022-09-27 DIAGNOSIS — M25561 Pain in right knee: Secondary | ICD-10-CM | POA: Diagnosis not present

## 2022-09-27 DIAGNOSIS — Z7901 Long term (current) use of anticoagulants: Secondary | ICD-10-CM | POA: Diagnosis not present

## 2022-09-27 DIAGNOSIS — Z800 Family history of malignant neoplasm of digestive organs: Secondary | ICD-10-CM | POA: Diagnosis not present

## 2022-09-27 DIAGNOSIS — Z803 Family history of malignant neoplasm of breast: Secondary | ICD-10-CM | POA: Insufficient documentation

## 2022-09-27 DIAGNOSIS — Z8249 Family history of ischemic heart disease and other diseases of the circulatory system: Secondary | ICD-10-CM | POA: Diagnosis not present

## 2022-09-27 DIAGNOSIS — Z23 Encounter for immunization: Secondary | ICD-10-CM

## 2022-09-27 DIAGNOSIS — Z170 Estrogen receptor positive status [ER+]: Secondary | ICD-10-CM

## 2022-09-27 DIAGNOSIS — Z79899 Other long term (current) drug therapy: Secondary | ICD-10-CM | POA: Diagnosis not present

## 2022-09-27 DIAGNOSIS — C50212 Malignant neoplasm of upper-inner quadrant of left female breast: Secondary | ICD-10-CM | POA: Insufficient documentation

## 2022-09-27 DIAGNOSIS — Z882 Allergy status to sulfonamides status: Secondary | ICD-10-CM | POA: Insufficient documentation

## 2022-09-27 DIAGNOSIS — M25562 Pain in left knee: Secondary | ICD-10-CM | POA: Diagnosis not present

## 2022-09-27 LAB — CMP (CANCER CENTER ONLY)
ALT: 6 U/L (ref 0–44)
AST: 12 U/L — ABNORMAL LOW (ref 15–41)
Albumin: 4.1 g/dL (ref 3.5–5.0)
Alkaline Phosphatase: 78 U/L (ref 38–126)
Anion gap: 6 (ref 5–15)
BUN: 16 mg/dL (ref 8–23)
CO2: 27 mmol/L (ref 22–32)
Calcium: 9.4 mg/dL (ref 8.9–10.3)
Chloride: 106 mmol/L (ref 98–111)
Creatinine: 1.1 mg/dL — ABNORMAL HIGH (ref 0.44–1.00)
GFR, Estimated: 49 mL/min — ABNORMAL LOW (ref >60)
Glucose, Bld: 95 mg/dL (ref 70–99)
Potassium: 4.7 mmol/L (ref 3.5–5.1)
Sodium: 139 mmol/L (ref 135–145)
Total Bilirubin: 0.3 mg/dL (ref 0.3–1.2)
Total Protein: 6.9 g/dL (ref 6.5–8.1)

## 2022-09-27 LAB — CBC WITH DIFFERENTIAL (CANCER CENTER ONLY)
Abs Immature Granulocytes: 0.01 10*3/uL (ref 0.00–0.07)
Basophils Absolute: 0.1 10*3/uL (ref 0.0–0.1)
Basophils Relative: 1 %
Eosinophils Absolute: 0.1 10*3/uL (ref 0.0–0.5)
Eosinophils Relative: 2 %
HCT: 33.9 % — ABNORMAL LOW (ref 36.0–46.0)
Hemoglobin: 11 g/dL — ABNORMAL LOW (ref 12.0–15.0)
Immature Granulocytes: 0 %
Lymphocytes Relative: 30 %
Lymphs Abs: 1.9 10*3/uL (ref 0.7–4.0)
MCH: 29.7 pg (ref 26.0–34.0)
MCHC: 32.4 g/dL (ref 30.0–36.0)
MCV: 91.6 fL (ref 80.0–100.0)
Monocytes Absolute: 0.5 10*3/uL (ref 0.1–1.0)
Monocytes Relative: 8 %
Neutro Abs: 3.8 10*3/uL (ref 1.7–7.7)
Neutrophils Relative %: 59 %
Platelet Count: 184 10*3/uL (ref 150–400)
RBC: 3.7 MIL/uL — ABNORMAL LOW (ref 3.87–5.11)
RDW: 14.4 % (ref 11.5–15.5)
WBC Count: 6.4 10*3/uL (ref 4.0–10.5)
nRBC: 0 % (ref 0.0–0.2)

## 2022-09-27 MED ORDER — INFLUENZA VAC A&B SA ADJ QUAD 0.5 ML IM PRSY
0.5000 mL | PREFILLED_SYRINGE | Freq: Once | INTRAMUSCULAR | Status: AC
  Administered 2022-09-27: 0.5 mL via INTRAMUSCULAR
  Filled 2022-09-27: qty 0.5

## 2022-09-27 NOTE — Progress Notes (Signed)
[place]  [phone] [phone]     ID: Jennell Corner DOB: [birth year]  MR#: [id number]  BJS#:[id number]  Patient Care Team: Darreld Mclean, MD as PCP - General (Family Medicine) Berniece Salines, DO as PCP - Cardiology (Cardiology) Earlie Server, MD as Consulting Physician (Orthopedic Surgery) Gatha Mayer, MD as Consulting Physician (Gastroenterology) Magrinat, Virgie Dad, MD (Inactive) as Consulting Physician (Oncology) Fanny Skates, MD as Consulting Physician (General Surgery) Berniece Salines, DO as Consulting Physician (Cardiology) OTHER MD:  CHIEF COMPLAINT: Estrogen receptor positive breast cancer  CURRENT TREATMENT: Observation  INTERVAL HISTORY: Aminat returns today for follow-up of her estrogen receptor positive breast cancer. She is now under observation after not tolerating antiestrogens.  She is accompanied by one of her daughters She tells me that her main complaint is osteoarthritis. She takes tramadol for arthritis. She also wants to get her flu shot done today.  She denies any changes in her breast.  She could not tolerate antiestrogen therapy.  With regards to osteoporosis, she could not tolerate Fosamax however continues on calcium and vitamin D supplementation.  Rest of the pertinent 10 point ROS reviewed and negative   COVID 19 VACCINATION STATUS: [place] x2, most recently 07/2020   HISTORY OF CURRENT ILLNESS:  From the original intake note:  KEYARI KLEEMAN had routine screening mammography at Dr. Arlyn Dunning on 10/30/2017 showing a possible abnormality in the left breast. She underwent bilateral diagnostic mammography with tomography and left breast ultrasonography at [facility] on 12/19/2017 showing:  a highly suspicious mass in the left breast at 11 o'clock. No evidence of left axillary lymphadenopathy. Ultrasound targeted to the left breast at 11 o'clock, 5 cm from the nipple demonstrates an ill-defined irregular  vascular mass with spiculated margins measuring 8 x 6 x 6 mm. Ultrasound of the left axilla demonstrates multiple normal-appearing lymph nodes.  Accordingly on 12/22/2017 she proceeded to biopsy of the left breast area in question. 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 Prognostic indicators significant for: ER, 90% positive and PR, 80% positive, both with strong staining intensity. Proliferation marker Ki67 at 3%. HER2 negative via [place].  The patient's subsequent history is as detailed below.   PAST MEDICAL HISTORY: Past Medical History:  Diagnosis Date   Breast cancer ([place]) 2019   Left Breast Cancer   Breast cancer in female [facility])    Left   [name] lesion, acute: Per EGD 11/06/2015 11/06/2015   Cataract    Erosive gastritis: per EGD 11/05/2105 11/06/2015   Family history of breast cancer    Family history of colon cancer    Heart murmur    Hemorrhoids; Grade 1 per colonoscopy 11/06/2015 11/06/2015   Knee pain     PAST SURGICAL HISTORY: Past Surgical History:  Procedure Laterality Date   BREAST LUMPECTOMY Left 03/30/2018   BREAST LUMPECTOMY WITH RADIOACTIVE SEED LOCALIZATION Left 03/30/2018   Procedure: RADIOACTIVE SEED GUIDED LEFT BREAST LUMPECTOMY;  Surgeon: Fanny Skates, MD;  Location: [place];  Service: General;  Laterality: Left;   COLONOSCOPY WITH PROPOFOL N/A 11/06/2015   Procedure: COLONOSCOPY WITH PROPOFOL;  Surgeon: Ladene Artist, MD;  [facility];  Service: Endoscopy;  Laterality: N/A;   ESOPHAGOGASTRODUODENOSCOPY (EGD) WITH PROPOFOL N/A 11/06/2015   Procedure: ESOPHAGOGASTRODUODENOSCOPY (EGD) WITH PROPOFOL;  Surgeon: Ladene Artist, MD;  [facility];  Service: Endoscopy;  Laterality: N/A;  EYE SURGERY      FAMILY HISTORY Family History  Problem Relation Age of Onset   Aneurysm  Mother        brain   Heart disease Father    Heart disease Brother    AAA (abdominal aortic aneurysm) Maternal Uncle    Colon cancer Paternal Uncle        dx >50   Breast cancer Cousin        pat cousin, dx in her late 86s  Her mother had a brain aneurysm at age 86 that she passed from. Her father lived with cardiac issues and passed at age 18. She has 2 brothers and 3 sisters. She had one paternal cousin that had breast cancer at age 35. One uncle had a prior hx of colon cancer. She denies any family hx of ovarian cancer.    GYNECOLOGIC HISTORY:  No LMP recorded. Patient is postmenopausal. Menarche: 88 years old Age at first live birth: 88 years old [place] P: [id number] LMP: 88 years old Contraceptive:No HRT: No    SOCIAL HISTORY:  She retired from [employer], [employer], [employer], and [employer] at age 49. She will have been married 84 years as of October 2022. Her husband's name is Elenore Rota and he is a Horticulturist, commercial. She lives at home with her husband. She has no pets. She keeps her granddaughter when the girl is not in school. She has 36 grandchildren and great grandchildren combined. She is a Psychologist, forensic.     ADVANCED DIRECTIVES: She is comfortable with her husband being her healthcare power of attorney   HEALTH MAINTENANCE: Social History   Tobacco Use   Smoking status: Never   Smokeless tobacco: Never  Vaping Use   Vaping Use: Never used  Substance Use Topics   Alcohol use: No   Drug use: No     Colonoscopy: 11/05/2015/[facility]  PAP: Last one >10 years ago per patient   Bone density: 12/26/2019, -3.4   Allergies  Allergen Reactions   Sulfa Antibiotics Hives    Current Outpatient Medications  Medication Sig Dispense Refill   Ascorbic Acid (VITAMIN C) 500 MG CAPS Take 500 mg by mouth daily.     Cholecalciferol (VITAMIN D3 PO) Take by mouth daily.     Cinnamon 500 MG capsule Take 1 capsule (500 mg total) by mouth daily.     ELIQUIS 5 MG TABS tablet TAKE 1 TABLET(5 MG) BY  MOUTH TWICE DAILY 60 tablet 5   furosemide (LASIX) 20 MG tablet Take 1 tablet (20 mg total) by mouth every 14 (fourteen) days. 7 tablet 3   metoprolol succinate (TOPROL-XL) 25 MG 24 hr tablet TAKE 1 TABLET(25 MG) BY MOUTH DAILY 90 tablet 1   Multiple Vitamins-Minerals (WOMENS MULTIVITAMIN PO) Take by mouth.     pantoprazole (PROTONIX) 40 MG tablet Take 1 tab by mouth daily. 90 tablet 1   potassium chloride (KLOR-CON) 10 MEQ tablet Take 1 tablet (10 mEq total) by mouth every 14 (fourteen) days. 7 tablet 3   traMADol (ULTRAM) 50 MG tablet TAKE 1 TABLET BY MOUTH EVERY 8 HOURS AS NEEDED FOR MODERATE PAIN 90 tablet 1   Trolamine Salicylate (ASPERCREME EX) Apply 1 application topically daily as needed (pain).     No current facility-administered medications for this visit.    OBJECTIVE: white woman using a cane  Vitals:   09/27/22 1318  BP: (!) 155/61  Pulse: 63  Resp: 16  Temp: 97.9 F (36.6 C)  SpO2:  99%      Body mass index is 33.28 kg/m.   Wt Readings from Last 3 Encounters:  09/27/22 153 lb 12.8 oz (69.8 kg)  08/12/22 140 lb (63.5 kg)  07/20/22 149 lb (67.6 kg)     ECOG FS:2 - Symptomatic, <50% confined to bed  Physical Exam Cardiovascular:     Rate and Rhythm: Normal rate and regular rhythm.  Chest:     Comments: Left breast s.p lumpectomy. Bilateral breasts normal to inspection. No palpable masses or regional adenopathy Musculoskeletal:        General: Swelling present.     Cervical back: Normal range of motion and neck supple. No rigidity.  Lymphadenopathy:     Cervical: No cervical adenopathy.       LAB RESULTS:  CMP     Component Value Date/Time   NA 140 07/20/2022 1419   K 4.4 07/20/2022 1419   CL 106 07/20/2022 1419   CO2 25 07/20/2022 1419   GLUCOSE 91 07/20/2022 1419   BUN 16 07/20/2022 1419   CREATININE 1.05 07/20/2022 1419   CREATININE 0.97 09/15/2021 1209   CREATININE 1.11 (H) 01/19/2016 1126   CALCIUM 9.1 07/20/2022 1419   PROT 6.7 07/20/2022  1419   ALBUMIN 4.3 07/20/2022 1419   AST 14 07/20/2022 1419   AST 17 09/15/2021 1209   ALT 7 07/20/2022 1419   ALT 13 09/15/2021 1209   ALKPHOS 80 07/20/2022 1419   BILITOT 0.3 07/20/2022 1419   BILITOT 0.7 09/15/2021 1209   GFRNONAA 57 (L) 09/15/2021 1209   GFRAA 47 (L) 09/02/2020 1237   GFRAA 57 (L) 03/14/2018 1441    No results found for: "TOTALPROTELP", "ALBUMINELP", "A1GS", "A2GS", "BETS", "BETA2SER", "GAMS", "MSPIKE", "SPEI"  No results found for: "KPAFRELGTCHN", "LAMBDASER", "KAPLAMBRATIO"  Lab Results  Component Value Date   WBC 6.4 09/27/2022   NEUTROABS 3.8 09/27/2022   HGB 11.0 (L) 09/27/2022   HCT 33.9 (L) 09/27/2022   MCV 91.6 09/27/2022   PLT 184 09/27/2022   No results found for: "LABCA2"  No components found for: "WRUEAV409"  No results for input(s): "INR" in the last 168 hours.  No results found for: "LABCA2"  No results found for: "WJX914"  No results found for: "CAN125"  No results found for: "CAN153"  No results found for: "CA2729"  No components found for: "HGQUANT"  No results found for: "CEA1", "CEA" / No results found for: "CEA1", "CEA"   No results found for: "AFPTUMOR"  No results found for: "CHROMOGRNA"  No results found for: "HGBA", "HGBA2QUANT", "HGBFQUANT", "HGBSQUAN" (Hemoglobinopathy evaluation)   No results found for: "LDH"  Lab Results  Component Value Date   IRON 14 (L) 11/05/2015   TIBC 545 (H) 11/05/2015   IRONPCTSAT 3 (L) 11/05/2015   (Iron and TIBC)  Lab Results  Component Value Date   FERRITIN 3 (L) 11/05/2015    Urinalysis    Component Value Date/Time   COLORURINE YELLOW 10/13/2019 1556   APPEARANCEUR CLEAR 10/13/2019 1556   LABSPEC 1.015 10/13/2019 1556   PHURINE 5.0 10/13/2019 1556   GLUCOSEU NEGATIVE 10/13/2019 1556   HGBUR SMALL (A) 10/13/2019 1556   BILIRUBINUR NEGATIVE 10/13/2019 1556   BILIRUBINUR negative 11/04/2015 1418   KETONESUR 20 (A) 10/13/2019 1556   PROTEINUR NEGATIVE 10/13/2019  1556   UROBILINOGEN 0.2 11/04/2015 1418   NITRITE NEGATIVE 10/13/2019 1556   LEUKOCYTESUR SMALL (A) 10/13/2019 1556    STUDIES: DG Bone Density  Result Date: 08/29/2022 EXAM: DUAL X-RAY ABSORPTIOMETRY (DXA) FOR  BONE MINERAL DENSITY IMPRESSION: [place] Your patient Itzayana Pardy completed a BMD test on 08/29/2022 using the [place] (analysis version: 16.SP2) manufactured by [employer]. The following summarizes the results of our evaluation. [facility] PATIENT: Name: Olivia, Pavelko Patient ID: [id number] Birth Date: 1934-10-29 Height: 59.0 in. Gender: Female Measured: 08/29/2022 Weight: 147.8 lbs. Indications: Advanced Age, Caucasian, Early Menopause, Estrogen Deficiency, Height Loss, History of Osteopenia, Low Calcium Intake, Post Menopausal, Previous Tobacco User Fractures: Treatments: Fosamax(Alendronate), HRT, Multivitamin, Vitamin D ASSESSMENT: The BMD measured at Forearm Radius 33% is 0.636 g/cm2 with a T-score of -2.7. This patient is considered osteoporotic according to [facility]) criteria. Lumbar spine was not utilized due to advanced degenerative changes. Compared with the prior study on, 12/26/2019 the BMD of the total mean shows a statistically significant decrease. The scan quality is good. Site Region Measured Date Measured Age WHO YA BMD Classification T-score DualFemur Total Mean 08/29/2022 86.5 Low Bone Mass -2.4 0.703 g/cm2 DualFemur Total Mean 12/26/2019 83.8 Low Bone Mass -2.1 0.740 g/cm2 DualFemur Total Mean 10/30/2017 81.7 Low Bone Mass -2.1 0.744 g/cm2 Left Forearm Radius 33% 08/29/2022 86.5 Osteoporosis -2.7 0.636 g/cm2 Left Forearm Radius 33% 12/26/2019 83.8 Osteoporosis -3.4 0.582 g/cm2 Left Forearm Radius 33% 10/30/2017 81.7 Low Bone Mass -2.4 0.668 g/cm2 World Health Organization [facility]) criteria for post-menopausal, Caucasian Women: Normal        T-score at or above -1 SD Low Bone Mass T-score between -1 and -2.5 SD Osteoporosis  T-score at  or below -2.5 SD RECOMMENDATION: 1. All patients should optimize calcium and vitamin D intake. 2. Consider FDA-approved medical therapies in postmenopausal women and men aged 83 years and older, based on the following: a. A hip or vertebral(clinical or morphometric) fracture. b. T-Score < -2.5 at the femoral neck or spine after appropriate evaluation to exclude secondary causes c. Low bone mass (T-score between -1.0 and -2.5 at the femoral neck or spine) and a 10 year probability of a hip fracture >3% or a 10 year probability of major osteoporosis-related fracture > 20% based on the [place]-adapted WHO algorithm d. Clinical judgement and/or patient preferences may indicate treatment for people with 10-year fracture probabilities above or below these levels FOLLOW-UP: Patients with diagnosis of osteoporosis or at high risk for fracture should have regular bone mineral density tests. For patients eligible for Medicare, routine testing is allowed once every 2 years. The testing frequency can be increased to one year for patients who have rapidly progressing disease, those who are receiving or discontinuing medical therapy to restore bone mass, or have additional risk factors. I have reviewed this report, and agree with the above findings. [facility] Radiology Electronically Signed   By: Franki Cabot M.D.   On: 08/29/2022 13:56      ELIGIBLE FOR AVAILABLE RESEARCH PROTOCOL:no  ASSESSMENT: 88 y.o. [place] woman status post left breast upper inner quadrant biopsy 12/22/2017 for a clinical T1b N0, stage IA invasive ductal carcinoma, grade 1, estrogen and progesterone receptor positive, HER-2 not amplified, with an MIB-1 of 3%.  (1) genetics testing 03/23/2018 through the hereditary Gene Panel offered by [facility] found no deleterious mutations in APC, ATM, AXIN2, BARD1, BMPR1A, BRCA1, BRCA2, BRIP1, CDH1, CDK4, CDKN2A (p14ARF), CDKN2A (p16INK4a), CHEK2, CTNNA1, DICER1, EPCAM (Deletion/duplication testing only), GREM1  (promoter region deletion/duplication testing only), KIT, MEN1, MLH1, MSH2, MSH3, MSH6, MUTYH, NBN, NF1, NHTL1, PALB2, PDGFRA, PMS2, POLD1, POLE, PTEN, RAD50, RAD51C, RAD51D, SDHB, SDHC, SDHD, SMAD4, SMARCA4. STK11, TP53, TSC1, TSC2, and  VHL.  The following genes were evaluated for sequence changes only: SDHA and HOXB13 c.251G>A variant only.   (a) APC c.7468G>A (p.Asp2490Asn) VUS identified   (2) patient initially refused surgery or radiation, but underwent left lumpectomy 03/30/2018 for a pT1b cN0, stage IA invasive ductal carcinoma, grade 1, with negative margins  (3) anastrozole started 01/02/2018  (a) bone density 10/30/2017 shows a T score of -2.5  (b) alendronate prescribed JAN 2019 but never started by patient  (c) anastrozole discontinued April 2021 with multiple side effects   PLAN: Patient is here for follow-up.  She could not tolerate antiestrogen therapy.  She is doing well otherwise. No concerns for recurrence.  Physical examination today unremarkable. She is scheduled for her next mammogram in December 2023, missed the last appointment because of some conflicting appointments. With regards to osteoporosis, we have discussed about talking to her PCP once again about potential treatments otherwise she can continue calcium, vitamin D and some weightbearing exercises. Last mammogram Oct 2022 negative for malignancy. She wants to get the flu shot done today, advised her to go to the infusion room. Return to clinic in 1 year or sooner as needed  *Total Encounter Time as defined by the Centers for Medicare and Medicaid Services includes, in addition to the face-to-face time of a patient visit (documented in the note above) non-face-to-face time: obtaining and reviewing outside history, ordering and reviewing medications, tests or procedures, care coordination (communications with other health care professionals or caregivers) and documentation in the medical record.

## 2022-10-07 ENCOUNTER — Other Ambulatory Visit: Payer: Self-pay | Admitting: Family Medicine

## 2022-10-07 ENCOUNTER — Other Ambulatory Visit: Payer: Self-pay | Admitting: Cardiology

## 2022-10-07 DIAGNOSIS — I4891 Unspecified atrial fibrillation: Secondary | ICD-10-CM

## 2022-10-07 DIAGNOSIS — Z8711 Personal history of peptic ulcer disease: Secondary | ICD-10-CM

## 2022-10-07 NOTE — Telephone Encounter (Signed)
Prescription refill request for Eliquis received. Indication:Afib Last office visit:2/23 Scr:1.1 Age: 88 Weight:69.8 kg  Prescription refilled

## 2022-11-16 ENCOUNTER — Ambulatory Visit: Payer: Medicare Other

## 2023-01-13 ENCOUNTER — Other Ambulatory Visit: Payer: Self-pay | Admitting: Family Medicine

## 2023-01-13 DIAGNOSIS — M170 Bilateral primary osteoarthritis of knee: Secondary | ICD-10-CM

## 2023-01-17 NOTE — Patient Instructions (Incomplete)
It was great to see again today, I will be in touch with your labs as soon as possible Recommend the shingles vaccine series and updated COVID booster at your pharmacy  Tetanus today I will check on your Prolia benefits for you- if covered we can start on this for you

## 2023-01-17 NOTE — Progress Notes (Unsigned)
[place] at [facility] [address], [place], [place] [address] [phone]  Date:  01/18/2023   Name:  Kathy Thornton   DOB:  1934-02-14   MRN:  [id number]  PCP:  No primary care provider on file.    Chief Complaint: No chief complaint on file.   History of Present Illness:  Kathy Thornton is a 89 y.o. very pleasant female patient who presents with the following:  Patient seen today for periodic follow-up Most recent visit with myself was in August History of breast cancer 2018, chronic arthritis pain, osteoporosis, chronic renal insufficiency, atrial fibrillation She had an ulcer in 2018 or 2019 due to NSAID use, we now use tramadol for her knee pain- She saw her oncologist in October for follow-up-current treatment is observation, she is not able to tolerate antiestrogen therapy  She lives with her husband, her daughter is also very involved  Her most recent bone density scan did show osteoporosis.  Patient did not tolerate Fosamax well in the past, can consider other options such as Prolia?   Recommend Shingrix, COVID booster Tetanus is due Bone density up-to-date Mammogram can be updated  CMP, CBC completed in October  Eliquis 5 twice daily Toprol-XL 25 Tramadol 50 every 8 hours as needed  Patient Active Problem List   Diagnosis Date Noted   Knee pain 05/17/2021   Cataract 05/17/2021   Genetic testing 03/27/2018   Family history of colon cancer    Family history of breast cancer    Malignant neoplasm of upper-inner quadrant of left breast in female, estrogen receptor positive ([place]) 01/02/2018   Osteoporosis 01/02/2018   Cameron lesion, acute: Per EGD 11/06/2015 11/06/2015   Erosive gastritis: per EGD 11/05/2105 11/06/2015   Hemorrhoids; Grade 1 per colonoscopy 11/06/2015 11/06/2015   Anemia, iron deficiency    Acute gastric ulcer    Anemia 11/05/2015   Symptomatic anemia 11/05/2015   Heart murmur 11/05/2015    Lower urinary tract infectious disease 11/05/2015   Lower extremity edema 11/05/2015   Arthritis of both knees 11/04/2015    Past Medical History:  Diagnosis Date   Breast cancer ([place]) 2019   Left Breast Cancer   Breast cancer in female [facility])    Left   Cameron lesion, acute: Per EGD 11/06/2015 11/06/2015   Cataract    Erosive gastritis: per EGD 11/05/2105 11/06/2015   Family history of breast cancer    Family history of colon cancer    Heart murmur    Hemorrhoids; Grade 1 per colonoscopy 11/06/2015 11/06/2015   Knee pain     Past Surgical History:  Procedure Laterality Date   BREAST LUMPECTOMY Left 03/30/2018   BREAST LUMPECTOMY WITH RADIOACTIVE SEED LOCALIZATION Left 03/30/2018   Procedure: RADIOACTIVE SEED GUIDED LEFT BREAST LUMPECTOMY;  Surgeon: Fanny Skates, MD;  Location: [place];  Service: General;  Laterality: Left;   COLONOSCOPY WITH PROPOFOL N/A 11/06/2015   Procedure: COLONOSCOPY WITH PROPOFOL;  Surgeon: Ladene Artist, MD;  [facility];  Service: Endoscopy;  Laterality: N/A;   ESOPHAGOGASTRODUODENOSCOPY (EGD) WITH PROPOFOL N/A 11/06/2015   Procedure: ESOPHAGOGASTRODUODENOSCOPY (EGD) WITH PROPOFOL;  Surgeon: Ladene Artist, MD;  [facility];  Service: Endoscopy;  Laterality: N/A;   EYE SURGERY      Social History   Tobacco Use   Smoking status: Never   Smokeless tobacco: Never  Vaping Use   Vaping Use: Never used  Substance Use Topics  Alcohol use: No   Drug use: No    Family History  Problem Relation Age of Onset   Aneurysm Mother        brain   Heart disease Father    Heart disease Brother    AAA (abdominal aortic aneurysm) Maternal Uncle    Colon cancer Paternal Uncle        dx >50   Breast cancer Cousin        pat cousin, dx in her late 18s    Allergies  Allergen Reactions   Sulfa Antibiotics Hives    Medication list has been reviewed and updated.  Current Outpatient Medications on File Prior to Visit  Medication Sig  Dispense Refill   Ascorbic Acid (VITAMIN C) 500 MG CAPS Take 500 mg by mouth daily.     Cholecalciferol (VITAMIN D3 PO) Take by mouth daily.     Cinnamon 500 MG capsule Take 1 capsule (500 mg total) by mouth daily.     ELIQUIS 5 MG TABS tablet TAKE 1 TABLET(5 MG) BY MOUTH TWICE DAILY 60 tablet 5   furosemide (LASIX) 20 MG tablet Take 1 tablet (20 mg total) by mouth every 14 (fourteen) days. 7 tablet 3   metoprolol succinate (TOPROL-XL) 25 MG 24 hr tablet TAKE 1 TABLET(25 MG) BY MOUTH DAILY 90 tablet 1   Multiple Vitamins-Minerals (WOMENS MULTIVITAMIN PO) Take by mouth.     pantoprazole (PROTONIX) 40 MG tablet TAKE 1 TABLET BY MOUTH DAILY 90 tablet 1   potassium chloride (KLOR-CON) 10 MEQ tablet Take 1 tablet (10 mEq total) by mouth every 14 (fourteen) days. 7 tablet 3   traMADol (ULTRAM) 50 MG tablet TAKE 1 TABLET BY MOUTH EVERY 8 HOURS AS NEEDED FOR MODERATE PAIN 90 tablet 1   Trolamine Salicylate (ASPERCREME EX) Apply 1 application topically daily as needed (pain).     No current facility-administered medications on file prior to visit.    Review of Systems:  As per HPI- otherwise negative.   Physical Examination: There were no vitals filed for this visit. There were no vitals filed for this visit. There is no height or weight on file to calculate BMI. Ideal Body Weight:    GEN: no acute distress. HEENT: Atraumatic, Normocephalic.  Ears and Nose: No external deformity. CV: RRR, No M/G/R. No JVD. No thrill. No extra heart sounds. PULM: CTA B, no wheezes, crackles, rhonchi. No retractions. No resp. distress. No accessory muscle use. ABD: S, NT, ND, +BS. No rebound. No HSM. EXTR: No c/c/e PSYCH: Normally interactive. Conversant.    Assessment and Plan: ***  Signed Kathy Blinks, MD

## 2023-01-18 ENCOUNTER — Ambulatory Visit (INDEPENDENT_AMBULATORY_CARE_PROVIDER_SITE_OTHER): Payer: Medicare Other | Admitting: Family Medicine

## 2023-01-18 VITALS — BP 132/80 | HR 66 | Temp 97.5°F | Resp 18 | Ht <= 58 in | Wt 150.8 lb

## 2023-01-18 DIAGNOSIS — C50212 Malignant neoplasm of upper-inner quadrant of left female breast: Secondary | ICD-10-CM | POA: Diagnosis not present

## 2023-01-18 DIAGNOSIS — I4891 Unspecified atrial fibrillation: Secondary | ICD-10-CM

## 2023-01-18 DIAGNOSIS — Z8711 Personal history of peptic ulcer disease: Secondary | ICD-10-CM

## 2023-01-18 DIAGNOSIS — S51802A Unspecified open wound of left forearm, initial encounter: Secondary | ICD-10-CM

## 2023-01-18 DIAGNOSIS — Z131 Encounter for screening for diabetes mellitus: Secondary | ICD-10-CM

## 2023-01-18 DIAGNOSIS — Z170 Estrogen receptor positive status [ER+]: Secondary | ICD-10-CM

## 2023-01-18 DIAGNOSIS — D649 Anemia, unspecified: Secondary | ICD-10-CM

## 2023-01-18 DIAGNOSIS — Z13220 Encounter for screening for lipoid disorders: Secondary | ICD-10-CM | POA: Diagnosis not present

## 2023-01-18 DIAGNOSIS — M170 Bilateral primary osteoarthritis of knee: Secondary | ICD-10-CM

## 2023-01-18 DIAGNOSIS — Z23 Encounter for immunization: Secondary | ICD-10-CM

## 2023-01-18 DIAGNOSIS — N289 Disorder of kidney and ureter, unspecified: Secondary | ICD-10-CM

## 2023-01-18 DIAGNOSIS — M818 Other osteoporosis without current pathological fracture: Secondary | ICD-10-CM | POA: Diagnosis not present

## 2023-01-18 LAB — BASIC METABOLIC PANEL
BUN: 13 mg/dL (ref 6–23)
CO2: 29 mEq/L (ref 19–32)
Calcium: 9.5 mg/dL (ref 8.4–10.5)
Chloride: 101 mEq/L (ref 96–112)
Creatinine, Ser: 1.17 mg/dL (ref 0.40–1.20)
GFR: 42.13 mL/min — ABNORMAL LOW (ref >60.00)
Glucose, Bld: 92 mg/dL (ref 70–99)
Potassium: 4.5 mEq/L (ref 3.5–5.1)
Sodium: 139 mEq/L (ref 135–145)

## 2023-01-18 LAB — CBC
HCT: 33.7 % — ABNORMAL LOW (ref 36.0–46.0)
Hemoglobin: 11.1 g/dL — ABNORMAL LOW (ref 12.0–15.0)
MCHC: 32.9 g/dL (ref 30.0–36.0)
MCV: 87.6 fl (ref 78.0–100.0)
Platelets: 207 10*3/uL (ref 150.0–400.0)
RBC: 3.85 Mil/uL — ABNORMAL LOW (ref 3.87–5.11)
RDW: 15.1 % (ref 11.5–15.5)
WBC: 5.9 10*3/uL (ref 4.0–10.5)

## 2023-01-18 LAB — LIPID PANEL
Cholesterol: 157 mg/dL (ref 0–200)
HDL: 65.2 mg/dL (ref >39.00)
LDL Cholesterol: 79 mg/dL (ref 0–99)
NonHDL: 91.78
Total CHOL/HDL Ratio: 2
Triglycerides: 66 mg/dL (ref 0.0–149.0)
VLDL: 13.2 mg/dL (ref 0.0–40.0)

## 2023-01-18 LAB — TSH: TSH: 2.76 u[IU]/mL (ref 0.35–5.50)

## 2023-01-18 LAB — HEMOGLOBIN A1C: Hgb A1c MFr Bld: 5.5 % (ref 4.6–6.5)

## 2023-01-18 MED ORDER — PANTOPRAZOLE SODIUM 40 MG PO TBEC: DELAYED_RELEASE_TABLET | ORAL | 3 refills | Status: DC

## 2023-01-18 MED ORDER — METOPROLOL SUCCINATE ER 25 MG PO TB24: ORAL_TABLET | ORAL | 3 refills | Status: DC

## 2023-01-19 ENCOUNTER — Telehealth: Payer: Self-pay | Admitting: *Deleted

## 2023-01-19 NOTE — Telephone Encounter (Signed)
Pt will be new start

## 2023-01-19 NOTE — Telephone Encounter (Signed)
-----   Message from Darreld Mclean, MD sent at 01/18/2023  1:30 PM EST ----- Can you check on prolia for her?  Osteoporosis, took fosamax but cannot tolerate  Thank you!  [place]

## 2023-01-20 NOTE — Telephone Encounter (Signed)
Prolia VOB initiated via [email or web address] 

## 2023-01-30 ENCOUNTER — Other Ambulatory Visit (HOSPITAL_COMMUNITY): Payer: Self-pay

## 2023-01-30 NOTE — Telephone Encounter (Addendum)
Initiated PA via [facility] portal. Insurance requires history of failure to both an oral and intravenous bisphosphate.

## 2023-01-31 ENCOUNTER — Telehealth: Payer: Self-pay | Admitting: *Deleted

## 2023-01-31 NOTE — Telephone Encounter (Signed)
Opened in error

## 2023-02-08 NOTE — Telephone Encounter (Signed)
Pt did not tolerate fosamax but we have not tried an IV bidphosphonate like Reclast.   Ashleigh- do you know how to go about getting her set up for Reclast  I LMOM for pt asking if she is interested

## 2023-02-08 NOTE — Telephone Encounter (Signed)
Please do VOB for Reclast

## 2023-02-15 ENCOUNTER — Ambulatory Visit
Admission: RE | Admit: 2023-02-15 | Discharge: 2023-02-15 | Disposition: A | Discharge status: Home / Self Care | Payer: Medicare Other | Source: Ambulatory Visit | Attending: Hematology and Oncology | Admitting: Hematology and Oncology

## 2023-02-15 DIAGNOSIS — Z1231 Encounter for screening mammogram for malignant neoplasm of breast: Secondary | ICD-10-CM

## 2023-03-01 NOTE — Telephone Encounter (Signed)
Checking on status of auth for Reclast.

## 2023-03-04 ENCOUNTER — Other Ambulatory Visit: Payer: Self-pay | Admitting: Cardiology

## 2023-03-06 NOTE — Telephone Encounter (Signed)
Rx refill sent to pharmacy. 

## 2023-03-13 NOTE — Telephone Encounter (Signed)
Can you please advise where patient will received Reclast? Short stay, office?  [place] Approved for Short Stay Administration: [id number]. Patient responsible for 20% Coinsurance.

## 2023-03-13 NOTE — Telephone Encounter (Signed)
Did they happened to give an estimated price?

## 2023-03-14 NOTE — Telephone Encounter (Signed)
Left message on machine to call back  

## 2023-03-15 ENCOUNTER — Other Ambulatory Visit: Payer: Self-pay

## 2023-03-15 ENCOUNTER — Telehealth: Payer: Self-pay | Admitting: Pharmacy Technician

## 2023-03-15 NOTE — Progress Notes (Signed)
Per PCP, Recast infusion ordered at [facility].

## 2023-03-15 NOTE — Telephone Encounter (Signed)
Clifton James note:  Patient will be scheduled as soon as possible.  Auth Submission: NO AUTH NEEDED Site of care: Site of care: [facility] Payer: [employer] MEDICARE Medication & CPT/J Code(s) submitted: Reclast (Zolendronic acid) [id number] Route of submission (phone, fax, portal):  Phone # Fax # Auth type: Buy/Bill Units/visits requested: 1 Reference number:  Approval from: 03/15/23 to 12/05/23

## 2023-03-15 NOTE — Telephone Encounter (Signed)
Reclast infusion order placed for [facility].  Infusion center to contact patient for scheduling.

## 2023-03-15 NOTE — Telephone Encounter (Signed)
Can you please put patient down to get at infusion center on [address].

## 2023-03-27 ENCOUNTER — Other Ambulatory Visit: Payer: Self-pay | Admitting: Cardiology

## 2023-03-27 NOTE — Telephone Encounter (Signed)
Please call pt to schedule overdue 1 year follow-up appointment with Dr. Servando Salina or APP. Thank you!

## 2023-03-29 ENCOUNTER — Ambulatory Visit (INDEPENDENT_AMBULATORY_CARE_PROVIDER_SITE_OTHER): Payer: Medicare Other

## 2023-03-29 VITALS — BP 126/70 | HR 56 | Temp 97.5°F | Resp 18 | Ht <= 58 in | Wt 153.4 lb

## 2023-03-29 DIAGNOSIS — M818 Other osteoporosis without current pathological fracture: Secondary | ICD-10-CM

## 2023-03-29 MED ORDER — DIPHENHYDRAMINE HCL 25 MG PO CAPS
25.0000 mg | ORAL_CAPSULE | Freq: Once | ORAL | Status: AC
  Administered 2023-03-29: 25 mg via ORAL
  Filled 2023-03-29: qty 1

## 2023-03-29 MED ORDER — ZOLEDRONIC ACID 5 MG/100ML IV SOLN
5.0000 mg | Freq: Once | INTRAVENOUS | Status: AC
  Administered 2023-03-29: 5 mg via INTRAVENOUS
  Filled 2023-03-29: qty 100

## 2023-03-29 MED ORDER — ACETAMINOPHEN 325 MG PO TABS
650.0000 mg | ORAL_TABLET | Freq: Once | ORAL | Status: AC
  Administered 2023-03-29: 650 mg via ORAL
  Filled 2023-03-29: qty 2

## 2023-03-29 MED ORDER — SODIUM CHLORIDE 0.9 % IV SOLN: INTRAVENOUS | Status: DC

## 2023-03-29 NOTE — Progress Notes (Signed)
Diagnosis: Osteoporosis  Provider:  Chilton Greathouse MD  Procedure: IV Infusion  IV Type: Peripheral, IV Location: R Antecubital  Reclast (Zolendronic Acid), Dose: 5 mg  Infusion Start Time: 1343  Infusion Stop Time: 1415  Post Infusion IV Care: Observation period completed and Peripheral IV Discontinued  Discharge: Condition: Good, Destination: Home . AVS Provided  Performed by:  Garnette Czech, RN

## 2023-03-29 NOTE — Patient Instructions (Signed)

## 2023-03-30 ENCOUNTER — Other Ambulatory Visit: Payer: Self-pay | Admitting: Cardiology

## 2023-03-30 DIAGNOSIS — I4891 Unspecified atrial fibrillation: Secondary | ICD-10-CM

## 2023-03-30 NOTE — Telephone Encounter (Signed)
*  STAT* If patient is at the pharmacy, call can be transferred to refill team.   1. Which medications need to be refilled? (please list name of each medication and dose if known)   potassium chloride (KLOR-CON) 10 MEQ tablet (Expired)  furosemide (LASIX) 20 MG tablet   ELIQUIS 5 MG TABS tablet   2. Which pharmacy/location (including street and city if local pharmacy) is medication to be sent to?   [id number] G[place] Otto, [place] - [address] AT [facility] [address] Phone: [phone]  Fax: [phone]      3. Do they need a 30 day or 90 day supply? 90   Pt scheduled for 07/26/23 with Dr. Servando Salina, she is completely out

## 2023-03-31 MED ORDER — FUROSEMIDE 20 MG PO TABS: 20.0000 mg | ORAL_TABLET | ORAL | 0 refills | Status: DC

## 2023-03-31 MED ORDER — POTASSIUM CHLORIDE ER 10 MEQ PO TBCR: 10.0000 meq | EXTENDED_RELEASE_TABLET | ORAL | 0 refills | Status: DC

## 2023-03-31 MED ORDER — APIXABAN 5 MG PO TABS
ORAL_TABLET | ORAL | 4 refills | Status: DC
Start: 2023-03-31 — End: 2023-07-26

## 2023-03-31 NOTE — Telephone Encounter (Signed)
Eliquis 5mg  refill request received. Patient is 89 years old, weight-69.6kg, Crea-1.17 on 01/18/23, Diagnosis-Afib, and last seen by Dr Servando Salina on 01/14/22 and pending an appt with Dr. Servando Salina on 07/26/23. Dose is appropriate based on dosing criteria. Will send in refill to requested pharmacy.

## 2023-03-31 NOTE — Telephone Encounter (Signed)
LVM letting patient know that medication was refilled and the Eliquis will be sent to Coumadin for the refill.

## 2023-04-03 ENCOUNTER — Other Ambulatory Visit: Payer: Self-pay

## 2023-04-05 ENCOUNTER — Other Ambulatory Visit (INDEPENDENT_AMBULATORY_CARE_PROVIDER_SITE_OTHER): Payer: Medicare Other

## 2023-04-05 ENCOUNTER — Telehealth: Payer: Self-pay | Admitting: Licensed Clinical Social Worker

## 2023-04-05 DIAGNOSIS — N289 Disorder of kidney and ureter, unspecified: Secondary | ICD-10-CM | POA: Diagnosis not present

## 2023-04-05 LAB — BASIC METABOLIC PANEL
BUN: 18 mg/dL (ref 6–23)
CO2: 25 mEq/L (ref 19–32)
Calcium: 9 mg/dL (ref 8.4–10.5)
Chloride: 104 mEq/L (ref 96–112)
Creatinine, Ser: 1.04 mg/dL (ref 0.40–1.20)
GFR: 48.45 mL/min — ABNORMAL LOW (ref >60.00)
Glucose, Bld: 91 mg/dL (ref 70–99)
Potassium: 4.6 mEq/L (ref 3.5–5.1)
Sodium: 137 mEq/L (ref 135–145)

## 2023-04-05 MED ORDER — FUROSEMIDE 20 MG PO TABS: 20.0000 mg | ORAL_TABLET | ORAL | 0 refills | Status: DC

## 2023-04-05 NOTE — Patient Outreach (Signed)
  Care Coordination   04/05/2023 Name: [name] MRN: [id number] DOB: July 15, 1934   Care Coordination Outreach Attempts:  An unsuccessful telephone outreach was attempted today to offer the patient information about available care coordination services.  Follow Up Plan:  Additional outreach attempts will be made to offer the patient care coordination information and services.   Encounter Outcome:  No Answer   Care Coordination Interventions:  No, not indicated    Sammuel Hines, LCSW Social Work Care Coordination  [facility] [employer] [phone]

## 2023-04-06 ENCOUNTER — Encounter: Payer: Self-pay | Admitting: Family Medicine

## 2023-04-18 ENCOUNTER — Telehealth: Payer: Self-pay | Admitting: Licensed Clinical Social Worker

## 2023-04-18 ENCOUNTER — Other Ambulatory Visit: Payer: Self-pay | Admitting: Family Medicine

## 2023-04-18 DIAGNOSIS — Z8711 Personal history of peptic ulcer disease: Secondary | ICD-10-CM

## 2023-04-18 DIAGNOSIS — I4891 Unspecified atrial fibrillation: Secondary | ICD-10-CM

## 2023-04-18 NOTE — Patient Outreach (Signed)
  Care Coordination   04/18/2023 Name: Kathy Thornton MRN: [id number] DOB: 1934/11/01   Care Coordination Outreach Attempts:  A second unsuccessful outreach was attempted today to offer the patient with information about available care coordination services.  Follow Up Plan:  Additional outreach attempts will be made to offer the patient care coordination information and services.   Encounter Outcome:  No Answer   Care Coordination Interventions:  No, not indicated    Sammuel Hines, LCSW Social Work Care Coordination  [facility] [employer] [phone]

## 2023-06-02 ENCOUNTER — Other Ambulatory Visit: Payer: Self-pay | Admitting: Family Medicine

## 2023-06-02 DIAGNOSIS — M170 Bilateral primary osteoarthritis of knee: Secondary | ICD-10-CM

## 2023-07-07 ENCOUNTER — Encounter (HOSPITAL_COMMUNITY): Payer: Self-pay

## 2023-07-07 ENCOUNTER — Other Ambulatory Visit: Payer: Self-pay

## 2023-07-07 ENCOUNTER — Emergency Department (HOSPITAL_COMMUNITY): Payer: Medicare Other

## 2023-07-07 ENCOUNTER — Emergency Department (HOSPITAL_COMMUNITY)
Admission: EM | Admit: 2023-07-07 | Discharge: 2023-07-07 | Disposition: A | Discharge status: Home / Self Care | Payer: Medicare Other | Attending: Emergency Medicine | Admitting: Emergency Medicine

## 2023-07-07 DIAGNOSIS — S60212A Contusion of left wrist, initial encounter: Secondary | ICD-10-CM | POA: Diagnosis not present

## 2023-07-07 DIAGNOSIS — W01198A Fall on same level from slipping, tripping and stumbling with subsequent striking against other object, initial encounter: Secondary | ICD-10-CM | POA: Insufficient documentation

## 2023-07-07 DIAGNOSIS — S199XXA Unspecified injury of neck, initial encounter: Secondary | ICD-10-CM | POA: Diagnosis not present

## 2023-07-07 DIAGNOSIS — M25532 Pain in left wrist: Secondary | ICD-10-CM | POA: Insufficient documentation

## 2023-07-07 DIAGNOSIS — W19XXXA Unspecified fall, initial encounter: Secondary | ICD-10-CM

## 2023-07-07 DIAGNOSIS — Z853 Personal history of malignant neoplasm of breast: Secondary | ICD-10-CM | POA: Insufficient documentation

## 2023-07-07 DIAGNOSIS — Y92000 Kitchen of unspecified non-institutional (private) residence as  the place of occurrence of the external cause: Secondary | ICD-10-CM | POA: Insufficient documentation

## 2023-07-07 DIAGNOSIS — Z7901 Long term (current) use of anticoagulants: Secondary | ICD-10-CM | POA: Diagnosis not present

## 2023-07-07 DIAGNOSIS — R9431 Abnormal electrocardiogram [ECG] [EKG]: Secondary | ICD-10-CM | POA: Diagnosis not present

## 2023-07-07 DIAGNOSIS — Z743 Need for continuous supervision: Secondary | ICD-10-CM | POA: Diagnosis not present

## 2023-07-07 DIAGNOSIS — R6889 Other general symptoms and signs: Secondary | ICD-10-CM | POA: Diagnosis not present

## 2023-07-07 DIAGNOSIS — M19042 Primary osteoarthritis, left hand: Secondary | ICD-10-CM | POA: Diagnosis not present

## 2023-07-07 DIAGNOSIS — S0990XA Unspecified injury of head, initial encounter: Secondary | ICD-10-CM | POA: Diagnosis not present

## 2023-07-07 DIAGNOSIS — I10 Essential (primary) hypertension: Secondary | ICD-10-CM | POA: Diagnosis not present

## 2023-07-07 DIAGNOSIS — J320 Chronic maxillary sinusitis: Secondary | ICD-10-CM | POA: Diagnosis not present

## 2023-07-07 DIAGNOSIS — M79642 Pain in left hand: Secondary | ICD-10-CM | POA: Diagnosis not present

## 2023-07-07 DIAGNOSIS — S60222A Contusion of left hand, initial encounter: Secondary | ICD-10-CM | POA: Insufficient documentation

## 2023-07-07 DIAGNOSIS — M1812 Unilateral primary osteoarthritis of first carpometacarpal joint, left hand: Secondary | ICD-10-CM | POA: Diagnosis not present

## 2023-07-07 DIAGNOSIS — S0003XA Contusion of scalp, initial encounter: Secondary | ICD-10-CM | POA: Diagnosis not present

## 2023-07-07 DIAGNOSIS — M19032 Primary osteoarthritis, left wrist: Secondary | ICD-10-CM | POA: Diagnosis not present

## 2023-07-07 DIAGNOSIS — S6992XA Unspecified injury of left wrist, hand and finger(s), initial encounter: Secondary | ICD-10-CM | POA: Diagnosis not present

## 2023-07-07 LAB — CBG MONITORING, ED: Glucose-Capillary: 91 mg/dL (ref 70–99)

## 2023-07-07 MED ORDER — CEPHALEXIN 500 MG PO CAPS: 500.0000 mg | ORAL_CAPSULE | Freq: Three times a day (TID) | ORAL | 0 refills | Status: AC

## 2023-07-07 NOTE — ED Triage Notes (Signed)
Patient bib [facility] from home after a mechanical fall in the kitchen. EMS reports she had a slip and fall and hit the left side of her head and left wrist. EMS reports patient denies LOC and pain. GCS 15. Patient has been ambulatory since the fall. Patient did not have a C-collar on on arrival to the ED. Patient is alert and oriented x4 on arrival to the ED.

## 2023-07-07 NOTE — ED Provider Notes (Signed)
[place] EMERGENCY DEPARTMENT AT [facility] Provider Note   CSN: [id number] Arrival date & time: 07/07/23  1634     History {Add pertinent medical, surgical, social history, OB history to HPI:1} Chief Complaint  Patient presents with  . Fall    Kathy Thornton is a 89 y.o. female.   Fall      Home Medications Prior to Admission medications   Medication Sig Start Date End Date Taking? Authorizing Provider  apixaban (ELIQUIS) 5 MG TABS tablet TAKE 1 TABLET(5 MG) BY MOUTH TWICE DAILY. PLEASE KEEP UPCOMING APPOINTMENT. 03/31/23   Tobb, Kardie, DO  Ascorbic Acid (VITAMIN C) 500 MG CAPS Take 500 mg by mouth daily.    [provider]  Cholecalciferol (VITAMIN D3 PO) Take by mouth daily.    [provider]  Cinnamon 500 MG capsule Take 1 capsule (500 mg total) by mouth daily. 06/11/18   Magrinat, Valentino Hue, MD  furosemide (LASIX) 20 MG tablet Take 1 tablet (20 mg total) by mouth every 14 (fourteen) days. Pt needs office visit before future refills.Thank You. 1st Attempt. 04/05/23   Tobb, Kardie, DO  metoprolol succinate (TOPROL-XL) 25 MG 24 hr tablet Take 1 tablet (25 mg total) by mouth daily. 04/18/23   Copland, Gwenlyn Found, MD  Multiple Vitamins-Minerals (WOMENS MULTIVITAMIN PO) Take by mouth.    [provider]  pantoprazole (PROTONIX) 40 MG tablet Take 1 tablet (40 mg total) by mouth daily. 04/18/23   Copland, Gwenlyn Found, MD  potassium chloride (KLOR-CON) 10 MEQ tablet Take 1 tablet (10 mEq total) by mouth every 14 (fourteen) days. 03/31/23 06/29/23  Tobb, Kardie, DO  traMADol (ULTRAM) 50 MG tablet TAKE 1 TABLET BY MOUTH EVERY 8 HOURS AS NEEDED FOR MODERATE PAIN 06/02/23   Copland, Gwenlyn Found, MD  Trolamine Salicylate (ASPERCREME EX) Apply 1 application topically daily as needed (pain).    [provider]      Allergies    Sulfa antibiotics    Review of Systems   Review of Systems  Physical Exam Updated Vital Signs BP (!) 148/62   Pulse 81    Temp 98.9 F (37.2 C) (Oral)   Resp 18   Ht 4\' 8"  (1.422 m)   Wt 68 kg   SpO2 98%   BMI 33.61 kg/m  Physical Exam  ED Results / Procedures / Treatments   Labs (all labs ordered are listed, but only abnormal results are displayed) Labs Reviewed - No data to display  EKG None  Radiology No results found.  Procedures Procedures  {Document cardiac monitor, telemetry assessment procedure when appropriate:1}  Medications Ordered in ED Medications - No data to display  ED Course/ Medical Decision Making/ A&P   {   Click here for ABCD2, HEART and other calculatorsREFRESH Note before signing :1}                              Medical Decision Making  ***  {Document critical care time when appropriate:1} {Document review of labs and clinical decision tools ie heart score, Chads2Vasc2 etc:1}  {Document your independent review of radiology images, and any outside records:1} {Document your discussion with family members, caretakers, and with consultants:1} {Document social determinants of health affecting pt's care:1} {Document your decision making why or why not admission, treatments were needed:1} Final Clinical Impression(s) / ED Diagnoses Final diagnoses:  None    Rx / DC Orders ED Discharge Orders  None

## 2023-07-07 NOTE — Discharge Instructions (Signed)
Take Tylenol as needed for pain.  Apply ice to swollen areas.  Rest, ice, and elevate your left hand and wrist.  Monitor for any signs of worse including increased pain, numbness, weakness, or changes in mental status.  Return to the ED for any worsening symptoms or further concerns.

## 2023-07-07 NOTE — ED Notes (Signed)
C-collar placed on patient.

## 2023-07-07 NOTE — ED Notes (Signed)
Patient stating she does not feel right and is having numbness in the right hand.  EDP notified and is at bedside.

## 2023-07-07 NOTE — Progress Notes (Signed)
   07/07/23 1630  Spiritual Encounters  Type of Visit Initial  Care provided to: Pt and family  Referral source Nurse (RN/NT/LPN)  Reason for visit Trauma  OnCall Visit No  Spiritual Framework  Patient Stress Factors None identified  Family Stress Factors None identified  Interventions  Spiritual Care Interventions Made Compassionate presence  Intervention Outcomes  Outcomes Awareness around self/spiritual resourses   Pt fall on thinners. I spoke with pt and family and let them know that Chaplains are here if needed.  Chaplain Intern: Paul Dykes. Christell Constant

## 2023-07-07 NOTE — ED Triage Notes (Signed)
Patient is on eliquis for A-fib.

## 2023-07-07 NOTE — ED Notes (Signed)
Patient transported to CT with RN on monitor.

## 2023-07-09 ENCOUNTER — Other Ambulatory Visit: Payer: Self-pay | Admitting: Cardiology

## 2023-07-14 ENCOUNTER — Telehealth: Payer: Self-pay

## 2023-07-14 NOTE — Telephone Encounter (Signed)
Transition Care Management Follow-up Telephone Call Date of discharge and from where: [facility] 8/2 How have you been since you were released from the hospital? Doing good  Any questions or concerns? No  Items Reviewed: Did the pt receive and understand the discharge instructions provided? Yes  Medications obtained and verified? Yes  Other? No  Any new allergies since your discharge? No  Dietary orders reviewed? No Do you have support at home? Yes     Follow up appointments reviewed:  PCP Hospital f/u appt confirmed? Yes  Scheduled to see PCP on 8/14 @ . Specialist Hospital f/u appt confirmed? No  Scheduled to see  on  @ . Are transportation arrangements needed? Yes  If their condition worsens, is the pt aware to call PCP or go to the Emergency Dept.? Yes Was the patient provided with contact information for the PCP's office or ED? Yes Was to pt encouraged to call back with questions or concerns? Yes

## 2023-07-18 NOTE — Patient Instructions (Incomplete)
It was good to see you again today, I am sorry that you fell recently! This fall recommend flu shot and COVID booster Also consider getting the shingles vaccine series if not done already  I am sorry you had this fall- I hope you continue to get better We are going to look into this leg swelling!  Labs, ultrasound asap I will be in touch with results We may need to use the furosemide daily or every other day for a bit to get your swelling under control

## 2023-07-18 NOTE — Progress Notes (Unsigned)
[facility] at [facility] [address] [place], [place] [address] [phone]  Date:  07/19/2023   Name:  Kathy Thornton   DOB:  09-01-1934   MRN:  [id number]  PCP:  Pearline Cables, MD    Chief Complaint: No chief complaint on file.   History of Present Illness:  Kathy Thornton is a 89 y.o. very pleasant female patient who presents with the following:  Patient seen today for periodic follow-up Most recent visit with myself was in February  History of breast cancer 2018, chronic arthritis pain, osteoporosis, chronic renal insufficiency, atrial fibrillation. She had an ulcer in 2018 or 2019 due to NSAID use, we now use tramadol for her knee pain-I maintain this prescription She last filled #90 on 6/28  She lives with her husband, their daughter is nearby  Most recent visit with oncology was in October She had a fall earlier this month, was seen in the ER on 8/2: 88 yo with a hx of AF on eliquis who presents after a mechanical fall. Did have head strike. Level 2 trauma activated. Also with L wrist and hand pain and cellulitis of the lower extremity on exam. No back or hip pain or other signs of trauma. CT head/c-spine without injury. X-rays of the LUE without fracture. Given abx for her cellulitis and instructed to fu with her PCP.   Patient Active Problem List   Diagnosis Date Noted   Knee pain 05/17/2021   Cataract 05/17/2021   Genetic testing 03/27/2018   Family history of colon cancer    Family history of breast cancer    Malignant neoplasm of upper-inner quadrant of left breast in female, estrogen receptor positive (HCC) 01/02/2018   Osteoporosis 01/02/2018   Cameron lesion, acute: Per EGD 11/06/2015 11/06/2015   Erosive gastritis: per EGD 11/05/2105 11/06/2015   Hemorrhoids; Grade 1 per colonoscopy 11/06/2015 11/06/2015   Anemia, iron deficiency    Acute gastric ulcer    Anemia 11/05/2015   Symptomatic anemia  11/05/2015   Heart murmur 11/05/2015   Lower urinary tract infectious disease 11/05/2015   Lower extremity edema 11/05/2015   Arthritis of both knees 11/04/2015    Past Medical History:  Diagnosis Date   Breast cancer (HCC) 2019   Left Breast Cancer   Breast cancer in female [facility])    Left   Cameron lesion, acute: Per EGD 11/06/2015 11/06/2015   Cataract    Erosive gastritis: per EGD 11/05/2105 11/06/2015   Family history of breast cancer    Family history of colon cancer    Heart murmur    Hemorrhoids; Grade 1 per colonoscopy 11/06/2015 11/06/2015   Knee pain     Past Surgical History:  Procedure Laterality Date   BREAST LUMPECTOMY Left 03/30/2018   BREAST LUMPECTOMY WITH RADIOACTIVE SEED LOCALIZATION Left 03/30/2018   Procedure: RADIOACTIVE SEED GUIDED LEFT BREAST LUMPECTOMY;  Surgeon: Claud Kelp, MD;  Location: [facility] OR;  Service: General;  Laterality: Left;   COLONOSCOPY WITH PROPOFOL N/A 11/06/2015   Procedure: COLONOSCOPY WITH PROPOFOL;  Surgeon: Meryl Dare, MD;  Location: [facility] ENDOSCOPY;  Service: Endoscopy;  Laterality: N/A;   ESOPHAGOGASTRODUODENOSCOPY (EGD) WITH PROPOFOL N/A 11/06/2015   Procedure: ESOPHAGOGASTRODUODENOSCOPY (EGD) WITH PROPOFOL;  Surgeon: Meryl Dare, MD;  Location: [facility] ENDOSCOPY;  Service: Endoscopy;  Laterality: N/A;   EYE SURGERY      Social History   Tobacco Use   Smoking status: Never  Smokeless tobacco: Never  Vaping Use   Vaping status: Never Used  Substance Use Topics   Alcohol use: No   Drug use: No    Family History  Problem Relation Age of Onset   Aneurysm Mother        brain   Heart disease Father    Heart disease Brother    AAA (abdominal aortic aneurysm) Maternal Uncle    Colon cancer Paternal Uncle        dx >50   Breast cancer Cousin        pat cousin, dx in her late 42s    Allergies  Allergen Reactions   Sulfa Antibiotics Hives    Medication list has been reviewed and updated.  Current Outpatient Medications  on File Prior to Visit  Medication Sig Dispense Refill   apixaban (ELIQUIS) 5 MG TABS tablet TAKE 1 TABLET(5 MG) BY MOUTH TWICE DAILY. PLEASE KEEP UPCOMING APPOINTMENT. 60 tablet 4   Ascorbic Acid (VITAMIN C) 500 MG CAPS Take 500 mg by mouth daily.     Cholecalciferol (VITAMIN D3 PO) Take by mouth daily.     Cinnamon 500 MG capsule Take 1 capsule (500 mg total) by mouth daily.     furosemide (LASIX) 20 MG tablet Take 1 tablet (20 mg total) by mouth every 14 (fourteen) days. Pt needs office visit before future refills.Thank You. 1st Attempt. 9 tablet 0   metoprolol succinate (TOPROL-XL) 25 MG 24 hr tablet Take 1 tablet (25 mg total) by mouth daily. 90 tablet 1   Multiple Vitamins-Minerals (WOMENS MULTIVITAMIN PO) Take by mouth.     pantoprazole (PROTONIX) 40 MG tablet Take 1 tablet (40 mg total) by mouth daily. 90 tablet 1   potassium chloride (KLOR-CON) 10 MEQ tablet TAKE 1 TABLET BY MOUTH EVERY 14 DAYS 9 tablet 0   traMADol (ULTRAM) 50 MG tablet TAKE 1 TABLET BY MOUTH EVERY 8 HOURS AS NEEDED FOR MODERATE PAIN 90 tablet 2   Trolamine Salicylate (ASPERCREME EX) Apply 1 application topically daily as needed (pain).     No current facility-administered medications on file prior to visit.    Review of Systems:  As per HPI- otherwise negative.   Physical Examination: There were no vitals filed for this visit. There were no vitals filed for this visit. There is no height or weight on file to calculate BMI. Ideal Body Weight:    GEN: no acute distress. HEENT: Atraumatic, Normocephalic.  Ears and Nose: No external deformity. CV: RRR, No M/G/R. No JVD. No thrill. No extra heart sounds. PULM: CTA B, no wheezes, crackles, rhonchi. No retractions. No resp. distress. No accessory muscle use. ABD: S, NT, ND, +BS. No rebound. No HSM. EXTR: No c/c/e PSYCH: Normally interactive. Conversant.    Assessment and Plan: ***  Signed Abbe Amsterdam, MD

## 2023-07-19 ENCOUNTER — Ambulatory Visit (HOSPITAL_BASED_OUTPATIENT_CLINIC_OR_DEPARTMENT_OTHER)
Admission: RE | Admit: 2023-07-19 | Discharge: 2023-07-19 | Disposition: A | Discharge status: Home / Self Care | Payer: Medicare Other | Source: Ambulatory Visit | Attending: Family Medicine | Admitting: Family Medicine

## 2023-07-19 ENCOUNTER — Other Ambulatory Visit: Payer: Self-pay | Admitting: Family Medicine

## 2023-07-19 ENCOUNTER — Ambulatory Visit (INDEPENDENT_AMBULATORY_CARE_PROVIDER_SITE_OTHER): Payer: Medicare Other | Admitting: Family Medicine

## 2023-07-19 VITALS — BP 132/70 | HR 78 | Temp 97.8°F | Resp 18 | Ht <= 58 in | Wt 148.2 lb

## 2023-07-19 DIAGNOSIS — R600 Localized edema: Secondary | ICD-10-CM | POA: Insufficient documentation

## 2023-07-19 DIAGNOSIS — Y92009 Unspecified place in unspecified non-institutional (private) residence as the place of occurrence of the external cause: Secondary | ICD-10-CM

## 2023-07-19 DIAGNOSIS — W19XXXD Unspecified fall, subsequent encounter: Secondary | ICD-10-CM | POA: Diagnosis not present

## 2023-07-19 DIAGNOSIS — M7989 Other specified soft tissue disorders: Secondary | ICD-10-CM | POA: Diagnosis not present

## 2023-07-19 MED ORDER — POTASSIUM CHLORIDE ER 10 MEQ PO TBCR
EXTENDED_RELEASE_TABLET | ORAL | 0 refills | Status: DC
Start: 2023-07-19 — End: 2023-08-17

## 2023-07-19 MED ORDER — FUROSEMIDE 20 MG PO TABS
ORAL_TABLET | ORAL | 0 refills | Status: DC
Start: 2023-07-19 — End: 2023-08-17

## 2023-07-20 ENCOUNTER — Telehealth: Payer: Self-pay | Admitting: *Deleted

## 2023-07-20 LAB — COMPREHENSIVE METABOLIC PANEL
ALT: 7 U/L (ref 0–35)
AST: 13 U/L (ref 0–37)
Albumin: 4.1 g/dL (ref 3.5–5.2)
Alkaline Phosphatase: 69 U/L (ref 39–117)
BUN: 13 mg/dL (ref 6–23)
CO2: 24 meq/L (ref 19–32)
Calcium: 9.2 mg/dL (ref 8.4–10.5)
Chloride: 101 meq/L (ref 96–112)
Creatinine, Ser: 1.09 mg/dL (ref 0.40–1.20)
GFR: 45.7 mL/min — ABNORMAL LOW (ref >60.00)
Glucose, Bld: 115 mg/dL — ABNORMAL HIGH (ref 70–99)
Potassium: 4.7 meq/L (ref 3.5–5.1)
Sodium: 134 meq/L — ABNORMAL LOW (ref 135–145)
Total Bilirubin: 0.3 mg/dL (ref 0.2–1.2)
Total Protein: 6.6 g/dL (ref 6.0–8.3)

## 2023-07-20 LAB — CBC
HCT: 31.4 % — ABNORMAL LOW (ref 36.0–46.0)
Hemoglobin: 9.9 g/dL — ABNORMAL LOW (ref 12.0–15.0)
MCHC: 31.7 g/dL (ref 30.0–36.0)
MCV: 86.1 fl (ref 78.0–100.0)
Platelets: 226 10*3/uL (ref 150.0–400.0)
RBC: 3.64 Mil/uL — ABNORMAL LOW (ref 3.87–5.11)
RDW: 16.7 % — ABNORMAL HIGH (ref 11.5–15.5)
WBC: 6 10*3/uL (ref 4.0–10.5)

## 2023-07-20 NOTE — Addendum Note (Signed)
Addended by: Abbe Amsterdam C on: 07/20/2023 01:11 PM   Modules accepted: Orders

## 2023-07-20 NOTE — Telephone Encounter (Signed)
Received call from [facility] lab stating they are unable to run BNP from sample collected yesterday and will need re-collection.  Notified pt's daughter and she states pt has an appt with the cardiologist on Wednesday next week and wants to know if she can wait and have that done then?  If not, is it ok to wait until tomorrow to bring her in for redraw as she is unable to bring pt in today?

## 2023-07-20 NOTE — Telephone Encounter (Signed)
Per PCP, place pt on lab schedule tomorrow around 1.  Appt scheduled.

## 2023-07-21 ENCOUNTER — Other Ambulatory Visit (INDEPENDENT_AMBULATORY_CARE_PROVIDER_SITE_OTHER): Payer: Medicare Other

## 2023-07-21 DIAGNOSIS — R600 Localized edema: Secondary | ICD-10-CM

## 2023-07-21 LAB — BRAIN NATRIURETIC PEPTIDE: Pro B Natriuretic peptide (BNP): 196 pg/mL — ABNORMAL HIGH (ref 0.0–100.0)

## 2023-07-23 NOTE — Progress Notes (Signed)
Received BNP- still waiting on repeat CBC Called daughter Aram Beecham to check on her mom  Swelling is much better BNP is non- contributory  They are using lasix 20 mg daily for the last 4 days; will go back to her normal regimen of one every 14 days.  However, I wonder if going forward it might be helpful to have her use furosemide perhaps twice a week. They are seeing Dr Servando Salina this week- I will touch base with her

## 2023-07-26 ENCOUNTER — Other Ambulatory Visit: Payer: Self-pay

## 2023-07-26 ENCOUNTER — Ambulatory Visit: Payer: Medicare Other | Attending: Cardiology | Admitting: Cardiology

## 2023-07-26 VITALS — BP 108/60 | HR 62 | Ht <= 58 in | Wt 144.6 lb

## 2023-07-26 DIAGNOSIS — I480 Paroxysmal atrial fibrillation: Secondary | ICD-10-CM | POA: Diagnosis not present

## 2023-07-26 DIAGNOSIS — I4891 Unspecified atrial fibrillation: Secondary | ICD-10-CM

## 2023-07-26 DIAGNOSIS — R600 Localized edema: Secondary | ICD-10-CM | POA: Diagnosis not present

## 2023-07-26 MED ORDER — APIXABAN 5 MG PO TABS
ORAL_TABLET | ORAL | 4 refills | Status: DC
Start: 2023-07-26 — End: 2023-09-21

## 2023-07-26 MED ORDER — METOPROLOL SUCCINATE ER 25 MG PO TB24
25.0000 mg | ORAL_TABLET | Freq: Every day | ORAL | 3 refills | Status: DC
Start: 2023-07-26 — End: 2024-01-18

## 2023-07-26 NOTE — Patient Instructions (Addendum)
Medication Instructions:  Your physician has recommended you make the following change in your medication:  Take Lasix 40 mg (2 pills) 2 days then back to 20 mg once daily.  Take Potassium 20 mEq (2 pills) for 2 days then back to 10 mEq once daily.  *If you need a refill on your cardiac medications before your next appointment, please call your pharmacy*   Lab Work: None  Testing/Procedures: None   Follow-Up: At [facility], you and your health needs are our priority.  As part of our continuing mission to provide you with exceptional heart care, we have created designated Provider Care Teams.  These Care Teams include your primary Cardiologist (physician) and Advanced Practice Providers (APPs -  Physician Assistants and Nurse Practitioners) who all work together to provide you with the care you need, when you need it.   Your next appointment:   12 week(s) - overbook  Provider:   Thomasene Ripple, DO

## 2023-07-26 NOTE — Telephone Encounter (Signed)
Prescription refill request for Eliquis received. Indication:afib Last office visit:8/24 Scr:1.09  8/24 Age: 89 Weight:65.6  kg  Prescription refilled

## 2023-07-26 NOTE — Progress Notes (Signed)
Cardiology Office Note:    Date:  07/26/2023   ID:  Kathy Thornton, DOB Apr 14, 1934, MRN [id number]  PCP:  Pearline Cables, MD  Cardiologist:  Thomasene Ripple, DO  Electrophysiologist:  None   Referring MD: Pearline Cables, MD   " I am doing well"   History of Present Illness:    Kathy Thornton is a 89 y.o. female with a hx of breast cancer status post lobectomy and radiation, paroxysmal atrial fibrillation on metoprolol as well as Eliquis, mild to moderate tricuspid regurgitation.  The patient is here for follow-up visit   Since I last saw the patient she had had an episode where she fell, is recovering from this fall.  She also has been treated for cellulitis.  She did see her PCP recently given significant lower extremity edema she was started on Lasix daily.  Past Medical History:  Diagnosis Date   Breast cancer (HCC) 2019   Left Breast Cancer   Breast cancer in female [place])    Left   Sheria Lang lesion, acute: Per EGD 11/06/2015 11/06/2015   Cataract    Erosive gastritis: per EGD 11/05/2105 11/06/2015   Family history of breast cancer    Family history of colon cancer    Heart murmur    Hemorrhoids; Grade 1 per colonoscopy 11/06/2015 11/06/2015   Knee pain     Past Surgical History:  Procedure Laterality Date   BREAST LUMPECTOMY Left 03/30/2018   BREAST LUMPECTOMY WITH RADIOACTIVE SEED LOCALIZATION Left 03/30/2018   Procedure: RADIOACTIVE SEED GUIDED LEFT BREAST LUMPECTOMY;  Surgeon: Claud Kelp, MD;  Location: [facility] OR;  Service: General;  Laterality: Left;   COLONOSCOPY WITH PROPOFOL N/A 11/06/2015   Procedure: COLONOSCOPY WITH PROPOFOL;  Surgeon: Meryl Dare, MD;  Location: [facility]COPY;  Service: Endoscopy;  Laterality: N/A;   ESOPHAGOGASTRODUODENOSCOPY (EGD) WITH PROPOFOL N/A 11/06/2015   Procedure: ESOPHAGOGASTRODUODENOSCOPY (EGD) WITH PROPOFOL;  Surgeon: Meryl Dare, MD;  Location: [facility];  Service: Endoscopy;  Laterality: N/A;   EYE SURGERY       Current Medications: Current Meds  Medication Sig   Ascorbic Acid (VITAMIN C) 500 MG CAPS Take 500 mg by mouth daily.   Cholecalciferol (VITAMIN D3 PO) Take by mouth daily.   Cinnamon 500 MG capsule Take 1 capsule (500 mg total) by mouth daily.   furosemide (LASIX) 20 MG tablet Take once daily as directed by MD   Multiple Vitamins-Minerals (WOMENS MULTIVITAMIN PO) Take by mouth.   pantoprazole (PROTONIX) 40 MG tablet Take 1 tablet (40 mg total) by mouth daily.   potassium chloride (KLOR-CON) 10 MEQ tablet Take one daily as needed when you take furosemide   traMADol (ULTRAM) 50 MG tablet TAKE 1 TABLET BY MOUTH EVERY 8 HOURS AS NEEDED FOR MODERATE PAIN   Trolamine Salicylate (ASPERCREME EX) Apply 1 application topically daily as needed (pain).   Turmeric (QC TUMERIC COMPLEX PO) Take by mouth.   [DISCONTINUED] apixaban (ELIQUIS) 5 MG TABS tablet TAKE 1 TABLET(5 MG) BY MOUTH TWICE DAILY. PLEASE KEEP UPCOMING APPOINTMENT.   [DISCONTINUED] metoprolol succinate (TOPROL-XL) 25 MG 24 hr tablet Take 1 tablet (25 mg total) by mouth daily.     Allergies:   Sulfa antibiotics   Social History   Socioeconomic History   Marital status: Married    Spouse name: Not on file   Number of children: Not on file   Years of education: Not on file   Highest education level: Not on file  Occupational History  Not on file  Tobacco Use   Smoking status: Never   Smokeless tobacco: Never  Vaping Use   Vaping status: Never Used  Substance and Sexual Activity   Alcohol use: No   Drug use: No   Sexual activity: Not on file  Other Topics Concern   Not on file  Social History Narrative   Not on file   Social Determinants of Health   Financial Resource Strain: Low Risk  (08/12/2022)   Overall Financial Resource Strain (CARDIA)    Difficulty of Paying Living Expenses: Not hard at all  Food Insecurity: No Food Insecurity (08/12/2022)   Hunger Vital Sign    Worried About Running Out of Food in the Last  Year: Never true    Ran Out of Food in the Last Year: Never true  Transportation Needs: No Transportation Needs (08/12/2022)   PRAPARE - Administrator, Civil Service (Medical): No    Lack of Transportation (Non-Medical): No  Physical Activity: Inactive (08/12/2022)   Exercise Vital Sign    Days of Exercise per Week: 0 days    Minutes of Exercise per Session: 0 min  Stress: No Stress Concern Present (08/12/2022)   [employer] of Occupational Health - Occupational Stress Questionnaire    Feeling of Stress : Not at all  Social Connections: Moderately Integrated (08/12/2022)   Social Connection and Isolation Panel [NHANES]    Frequency of Communication with Friends and Family: More than three times a week    Frequency of Social Gatherings with Friends and Family: Twice a week    Attends Religious Services: 1 to 4 times per year    Active Member of [employer] or Organizations: No    Attends Banker Meetings: Never    Marital Status: Married     Family History: The patient's family history includes AAA (abdominal aortic aneurysm) in her maternal uncle; Aneurysm in her mother; Breast cancer in her cousin; Colon cancer in her paternal uncle; Heart disease in her brother and father.  ROS:   Review of Systems  Constitution: Negative for decreased appetite, fever and weight gain.  HENT: Negative for congestion, ear discharge, hoarse voice and sore throat.   Eyes: Negative for discharge, redness, vision loss in right eye and visual halos.  Cardiovascular: Bilateral leg swelling.  Negative for chest pain, dyspnea on exertion, orthopnea and palpitations.  Respiratory: Negative for cough, hemoptysis, shortness of breath and snoring.   Endocrine: Negative for heat intolerance and polyphagia.  Hematologic/Lymphatic: Negative for bleeding problem. Does not bruise/bleed easily.  Skin: Negative for flushing, nail changes, rash and suspicious lesions.  Musculoskeletal: Negative for  arthritis, joint pain, muscle cramps, myalgias, neck pain and stiffness.  Gastrointestinal: Negative for abdominal pain, bowel incontinence, diarrhea and excessive appetite.  Genitourinary: Negative for decreased libido, genital sores and incomplete emptying.  Neurological: Negative for brief paralysis, focal weakness, headaches and loss of balance.  Psychiatric/Behavioral: Negative for altered mental status, depression and suicidal ideas.  Allergic/Immunologic: Negative for HIV exposure and persistent infections.    EKGs/Labs/Other Studies Reviewed:    The following studies were reviewed today:   EKG: None today  Zio monitor Patch Wear Time:  3 days and 1 hours starting June 18, 2021. Indication: Paroxysmal atrial fibrillation.     Patient had a min HR of 48 bpm, max HR of 154 bpm, and avg HR of 63 bpm.    Predominant underlying rhythm was Sinus Rhythm. Bundle Branch Block-interventricular conduction delay was present.  1583 Supraventricular Tachycardia runs occurred, the run with the fastest interval lasting 5.1 secs with a max rate of 154 bpm, the longest lasting 32.8 secs with an avg rate of 115 bpm.    Premature atrial complexes were rare (<1.0%). Premature ventricular complexes were rare (<1.0%).   No patient triggered events or diary events reported.      Conclusion: Paroxysmal supraventricular tachycardia which is suspected to be atrial tachycardia with variable block.  Echocardiogram June 09, 2021 IMPRESSIONS     1. Left ventricular ejection fraction, by estimation, is 55 to 60%. The  left ventricle has normal function. The left ventricle has no regional  wall motion abnormalities. Left ventricular diastolic parameters are  indeterminate. Elevated left ventricular  end-diastolic pressure. The average left ventricular global longitudinal  strain is -21.2 %. The global longitudinal strain is normal.   2. Right ventricular systolic function is normal. The right  ventricular  size is normal. There is mildly elevated pulmonary artery systolic  pressure. The estimated right ventricular systolic pressure is 40.2 mmHg.   3. The mitral valve is normal in structure. Mild mitral valve  regurgitation. No evidence of mitral stenosis.   4. Tricuspid valve regurgitation is mild to moderate.   5. The aortic valve is normal in structure. Aortic valve regurgitation is  not visualized. No aortic stenosis is present.   6. The inferior vena cava is normal in size with greater than 50%  respiratory variability, suggesting right atrial pressure of 3 mmHg.   FINDINGS   Left Ventricle: Left ventricular ejection fraction, by estimation, is 55  to 60%. The left ventricle has normal function. The left ventricle has no  regional wall motion abnormalities. The average left ventricular global  longitudinal strain is -21.2 %.  The global longitudinal strain is normal. The left ventricular internal  cavity size was normal in size. There is no left ventricular hypertrophy.  Left ventricular diastolic parameters are indeterminate. Elevated left  ventricular end-diastolic pressure.   Right Ventricle: The right ventricular size is normal. No increase in  right ventricular wall thickness. Right ventricular systolic function is  normal. There is mildly elevated pulmonary artery systolic pressure. The  tricuspid regurgitant velocity is 3.05   m/s, and with an assumed right atrial pressure of 3 mmHg, the estimated  right ventricular systolic pressure is 40.2 mmHg.   Left Atrium: Left atrial size was normal in size.   Right Atrium: Right atrial size was normal in size.   Pericardium: There is no evidence of pericardial effusion.   Mitral Valve: The mitral valve is normal in structure. Mild mitral valve  regurgitation. No evidence of mitral valve stenosis.   Tricuspid Valve: The tricuspid valve is normal in structure. Tricuspid  valve regurgitation is mild to moderate. No  evidence of tricuspid  stenosis.   Aortic Valve: The aortic valve is normal in structure. Aortic valve  regurgitation is not visualized. No aortic stenosis is present.   Pulmonic Valve: The pulmonic valve was normal in structure. Pulmonic valve  regurgitation is trivial. No evidence of pulmonic stenosis.   Aorta: The aortic root is normal in size and structure.   Venous: The inferior vena cava is normal in size with greater than 50%  respiratory variability, suggesting right atrial pressure of 3 mmHg.   IAS/Shunts: No atrial level shunt detected by color flow Doppler.   Recent Labs: 01/18/2023: TSH 2.76 07/19/2023: ALT 7; BUN 13; Creatinine, Ser 1.09; Hemoglobin 9.9; Platelets 226.0; Potassium 4.7; Sodium 134 07/21/2023:  Pro B Natriuretic peptide (BNP) 196.0  Recent Lipid Panel    Component Value Date/Time   CHOL 157 01/18/2023 1344   TRIG 66.0 01/18/2023 1344   HDL 65.20 01/18/2023 1344   CHOLHDL 2 01/18/2023 1344   VLDL 13.2 01/18/2023 1344   LDLCALC 79 01/18/2023 1344    Physical Exam:    VS:  BP 108/60 (BP Location: Left Arm, Patient Position: Sitting, Cuff Size: Normal)   Pulse 62   Ht 4\' 9"  (1.448 m)   Wt 144 lb 9.6 oz (65.6 kg)   SpO2 99%   BMI 31.29 kg/m     Wt Readings from Last 3 Encounters:  07/26/23 144 lb 9.6 oz (65.6 kg)  07/19/23 148 lb 3.2 oz (67.2 kg)  07/07/23 149 lb 14.6 oz (68 kg)     GEN: Well nourished, well developed in no acute distress HEENT: Normal NECK: No JVD; No carotid bruits LYMPHATICS: No lymphadenopathy CARDIAC: S1S2 noted,RRR, no murmurs, rubs, gallops RESPIRATORY:  Clear to auscultation without rales, wheezing or rhonchi  ABDOMEN: Soft, non-tender, non-distended, +bowel sounds, no guarding. EXTREMITIES: +2 bilateral lower extremity edema, No cyanosis, no clubbing MUSCULOSKELETAL:  No deformity  SKIN: Warm and dry NEUROLOGIC:  Alert and oriented x 3, non-focal PSYCHIATRIC:  Normal affect, good insight  ASSESSMENT:    1.  Lower extremity edema   2. Paroxysmal atrial fibrillation (HCC)    PLAN:    Will have her increase the Lasix to 40 mg for the next 2 days as well as the potassium supplement.  And then she will go back to once daily.  If she does not improve on the lower extremity dependent edema I will plan to repeat her echocardiogram at her follow-up visit.  Will continue her metoprolol succinate as well as her Eliquis.  The patient is in agreement with the above plan. The patient left the office in stable condition.  The patient will follow up in  12 weeks  or sooner if needed.   Medication Adjustments/Labs and Tests Ordered: Current medicines are reviewed at length with the patient today.  Concerns regarding medicines are outlined above.  No orders of the defined types were placed in this encounter.  Meds ordered this encounter  Medications   metoprolol succinate (TOPROL-XL) 25 MG 24 hr tablet    Sig: Take 1 tablet (25 mg total) by mouth daily.    Dispense:  90 tablet    Refill:  3    Patient Instructions  Medication Instructions:  Your physician has recommended you make the following change in your medication:  Take Lasix 40 mg (2 pills) 2 days then back to 20 mg once daily.  Take Potassium 20 mEq (2 pills) for 2 days then back to 10 mEq once daily.  *If you need a refill on your cardiac medications before your next appointment, please call your pharmacy*   Lab Work: None  Testing/Procedures: None   Follow-Up: At [facility], you and your health needs are our priority.  As part of our continuing mission to provide you with exceptional heart care, we have created designated Provider Care Teams.  These Care Teams include your primary Cardiologist (physician) and Advanced Practice Providers (APPs -  Physician Assistants and Nurse Practitioners) who all work together to provide you with the care you need, when you need it.   Your next appointment:   12 week(s) -  overbook  Provider:   Thomasene Ripple, DO     Adopting a Healthy Lifestyle.  Know what a healthy weight is for you (roughly BMI <25) and aim to maintain this   Aim for 7+ servings of fruits and vegetables daily   65-80+ fluid ounces of water or unsweet tea for healthy kidneys   Limit to max 1 drink of alcohol per day; avoid smoking/tobacco   Limit animal fats in diet for cholesterol and heart health - choose grass fed whenever available   Avoid highly processed foods, and foods high in saturated/trans fats   Aim for low stress - take time to unwind and care for your mental health   Aim for 150 min of moderate intensity exercise weekly for heart health, and weights twice weekly for bone health   Aim for 7-9 hours of sleep daily   When it comes to diets, agreement about the perfect plan isnt easy to find, even among the experts. Experts at the [facility] of [employer] developed an idea known as the Healthy Eating Plate. Just imagine a plate divided into logical, healthy portions.   The emphasis is on diet quality:   Load up on vegetables and fruits - one-half of your plate: Aim for color and variety, and remember that potatoes dont count.   Go for whole grains - one-quarter of your plate: Whole wheat, barley, wheat berries, quinoa, oats, brown rice, and foods made with them. If you want pasta, go with whole wheat pasta.   Protein power - one-quarter of your plate: Fish, chicken, beans, and nuts are all healthy, versatile protein sources. Limit red meat.   The diet, however, does go beyond the plate, offering a few other suggestions.   Use healthy plant oils, such as olive, canola, soy, corn, sunflower and peanut. Check the labels, and avoid partially hydrogenated oil, which have unhealthy trans fats.   If youre thirsty, drink water. Coffee and tea are good in moderation, but skip sugary drinks and limit milk and dairy products to one or two daily servings.   The type of  carbohydrate in the diet is more important than the amount. Some sources of carbohydrates, such as vegetables, fruits, whole grains, and beans-are healthier than others.   Finally, stay active  Signed, Thomasene Ripple, DO  07/26/2023 2:00 PM    [facility]

## 2023-08-11 ENCOUNTER — Telehealth: Payer: Self-pay | Admitting: Hematology and Oncology

## 2023-08-11 NOTE — Telephone Encounter (Signed)
Left patient message regarding rescheduled appointment times/dates due to provider being unavailable

## 2023-08-14 ENCOUNTER — Telehealth: Payer: Self-pay | Admitting: Family Medicine

## 2023-08-14 NOTE — Telephone Encounter (Signed)
Copied from CRM [id number]. Topic: Medicare AWV >> Aug 14, 2023  1:38 PM Payton Doughty wrote: Reason for CRM: LM 08/14/2023 to schedule AWV   Verlee Rossetti; Care Guide Ambulatory Clinical Support [place] [facility] Direct Dial: [phone]

## 2023-08-16 ENCOUNTER — Other Ambulatory Visit: Payer: Self-pay | Admitting: Family Medicine

## 2023-08-16 DIAGNOSIS — R600 Localized edema: Secondary | ICD-10-CM

## 2023-09-05 ENCOUNTER — Telehealth: Payer: Self-pay | Admitting: Hematology and Oncology

## 2023-09-05 NOTE — Telephone Encounter (Signed)
Per Staff message. I called the patient and left voice message that I rescheduled her appointment. I also mailed her a reminder.

## 2023-09-21 ENCOUNTER — Other Ambulatory Visit: Payer: Self-pay | Admitting: Cardiology

## 2023-09-21 DIAGNOSIS — I4891 Unspecified atrial fibrillation: Secondary | ICD-10-CM

## 2023-09-21 NOTE — Telephone Encounter (Signed)
Prescription refill request for Eliquis received. Indication: afib  Last office visit: [place] 07/26/2023 Scr: 1.09, 07/19/2023 Age:  89 yo  Weight: 65.6 kg   Refill sent.

## 2023-09-27 ENCOUNTER — Inpatient Hospital Stay: Payer: Medicare Other | Admitting: Hematology and Oncology

## 2023-09-29 ENCOUNTER — Ambulatory Visit: Payer: Medicare Other | Admitting: Hematology and Oncology

## 2023-10-03 ENCOUNTER — Ambulatory Visit: Payer: Medicare Other | Admitting: Hematology and Oncology

## 2023-10-20 ENCOUNTER — Encounter: Payer: Self-pay | Admitting: Cardiology

## 2023-10-20 ENCOUNTER — Ambulatory Visit: Payer: Medicare Other | Attending: Cardiology | Admitting: Cardiology

## 2023-10-20 VITALS — BP 144/62 | HR 48 | Ht <= 58 in | Wt 145.0 lb

## 2023-10-20 DIAGNOSIS — I480 Paroxysmal atrial fibrillation: Secondary | ICD-10-CM | POA: Diagnosis not present

## 2023-10-20 DIAGNOSIS — Z79899 Other long term (current) drug therapy: Secondary | ICD-10-CM | POA: Diagnosis not present

## 2023-10-20 DIAGNOSIS — I872 Venous insufficiency (chronic) (peripheral): Secondary | ICD-10-CM | POA: Diagnosis not present

## 2023-10-20 MED ORDER — FUROSEMIDE 40 MG PO TABS: 40.0000 mg | ORAL_TABLET | Freq: Every day | ORAL | 3 refills | Status: AC

## 2023-10-20 MED ORDER — APIXABAN 5 MG PO TABS: 5.0000 mg | ORAL_TABLET | Freq: Two times a day (BID) | ORAL | Status: DC

## 2023-10-20 NOTE — Patient Instructions (Signed)
Medication Instructions:  Your physician has recommended you make the following change in your medication:  START: Lasix 40 mg once daily  *If you need a refill on your cardiac medications before your next appointment, please call your pharmacy*   Lab Work: CMET, Mag If you have labs (blood work) drawn today and your tests are completely normal, you will receive your results only by: MyChart Message (if you have MyChart) OR A paper copy in the mail If you have any lab test that is abnormal or we need to change your treatment, we will call you to review the results.   Follow-Up: At [facility], you and your health needs are our priority.  As part of our continuing mission to provide you with exceptional heart care, we have created designated Provider Care Teams.  These Care Teams include your primary Cardiologist (physician) and Advanced Practice Providers (APPs -  Physician Assistants and Nurse Practitioners) who all work together to provide you with the care you need, when you need it.  We recommend signing up for the patient portal called "MyChart".  Sign up information is provided on this After Visit Summary.  MyChart is used to connect with patients for Virtual Visits (Telemedicine).  Patients are able to view lab/test results, encounter notes, upcoming appointments, etc.  Non-urgent messages can be sent to your provider as well.   To learn more about what you can do with MyChart, go to [email or web address].    Your next appointment:   16 week(s)  Provider:   Thomasene Ripple, DO

## 2023-10-21 LAB — COMPREHENSIVE METABOLIC PANEL
ALT: 9 [IU]/L (ref 0–32)
AST: 14 [IU]/L (ref 0–40)
Albumin: 4.3 g/dL (ref 3.7–4.7)
Alkaline Phosphatase: 87 [IU]/L (ref 44–121)
BUN/Creatinine Ratio: 13 (ref 12–28)
BUN: 15 mg/dL (ref 8–27)
Bilirubin Total: 0.3 mg/dL (ref 0.0–1.2)
CO2: 25 mmol/L (ref 20–29)
Calcium: 9.7 mg/dL (ref 8.7–10.3)
Chloride: 101 mmol/L (ref 96–106)
Creatinine, Ser: 1.16 mg/dL — ABNORMAL HIGH (ref 0.57–1.00)
Globulin, Total: 2.6 g/dL (ref 1.5–4.5)
Glucose: 96 mg/dL (ref 70–99)
Potassium: 4.6 mmol/L (ref 3.5–5.2)
Sodium: 141 mmol/L (ref 134–144)
Total Protein: 6.9 g/dL (ref 6.0–8.5)
eGFR: 46 mL/min/{1.73_m2} — ABNORMAL LOW (ref >59)

## 2023-10-21 LAB — MAGNESIUM: Magnesium: 2 mg/dL (ref 1.6–2.3)

## 2023-10-23 ENCOUNTER — Telehealth: Payer: Self-pay | Admitting: Cardiology

## 2023-10-23 NOTE — Progress Notes (Signed)
Cardiology Office Note:    Date:  10/23/2023   ID:  Kathy Thornton, DOB 16-Sep-1934, MRN [id number]  PCP:  Pearline Cables, MD  Cardiologist:  Thomasene Ripple, DO  Electrophysiologist:  None   Referring MD: Pearline Cables, MD   " I am doing well"   History of Present Illness:    Kathy Thornton is a 89 y.o. female with a hx of breast cancer status post lobectomy and radiation, paroxysmal atrial fibrillation on metoprolol as well as Eliquis, mild to moderate tricuspid regurgitation.  The patient is here for follow-up visit  She presents with ongoing leg swelling despite current treatment with Lasix and potassium supplements. The swelling is significant and is not relieved by elevating the leg on a pillow. The patient reports that the swelling increases with activity. The patient also mentions a previous injury and a hip infusion, which she believes may have contributed to the current leg swelling. The patient also reports bone pain, which has improved since starting treatment. The patient has been experiencing dizziness, which she attributes to the potassium supplements. The patient also mentions a history of falls and expresses concern about the risk of falling due to dizziness.  Past Medical History:  Diagnosis Date   Breast cancer (HCC) 2019   Left Breast Cancer   Breast cancer in female [facility])    Left   Sheria Lang lesion, acute: Per EGD 11/06/2015 11/06/2015   Cataract    Erosive gastritis: per EGD 11/05/2105 11/06/2015   Family history of breast cancer    Family history of colon cancer    Heart murmur    Hemorrhoids; Grade 1 per colonoscopy 11/06/2015 11/06/2015   Knee pain     Past Surgical History:  Procedure Laterality Date   BREAST LUMPECTOMY Left 03/30/2018   BREAST LUMPECTOMY WITH RADIOACTIVE SEED LOCALIZATION Left 03/30/2018   Procedure: RADIOACTIVE SEED GUIDED LEFT BREAST LUMPECTOMY;  Surgeon: Claud Kelp, MD;  Location: [facility] OR;  Service: General;  Laterality: Left;    COLONOSCOPY WITH PROPOFOL N/A 11/06/2015   Procedure: COLONOSCOPY WITH PROPOFOL;  Surgeon: Meryl Dare, MD;  Location: [facility]COPY;  Service: Endoscopy;  Laterality: N/A;   ESOPHAGOGASTRODUODENOSCOPY (EGD) WITH PROPOFOL N/A 11/06/2015   Procedure: ESOPHAGOGASTRODUODENOSCOPY (EGD) WITH PROPOFOL;  Surgeon: Meryl Dare, MD;  Location: [facility];  Service: Endoscopy;  Laterality: N/A;   EYE SURGERY      Current Medications: Current Meds  Medication Sig   apixaban (ELIQUIS) 5 MG TABS tablet TAKE 1 TABLET BY MOUTH TWICE DAILY. KEEP APPOINTMENT   apixaban (ELIQUIS) 5 MG TABS tablet Take 1 tablet (5 mg total) by mouth 2 (two) times daily.   Ascorbic Acid (VITAMIN C) 500 MG CAPS Take 500 mg by mouth daily.   Cholecalciferol (VITAMIN D3 PO) Take by mouth daily.   Cinnamon 500 MG capsule Take 1 capsule (500 mg total) by mouth daily.   furosemide (LASIX) 40 MG tablet Take 1 tablet (40 mg total) by mouth daily.   metoprolol succinate (TOPROL-XL) 25 MG 24 hr tablet Take 1 tablet (25 mg total) by mouth daily.   Multiple Vitamins-Minerals (WOMENS MULTIVITAMIN PO) Take by mouth.   pantoprazole (PROTONIX) 40 MG tablet Take 1 tablet (40 mg total) by mouth daily.   potassium chloride (KLOR-CON) 10 MEQ tablet TAKE 1 TABLET BY MOUTH DAILY AS NEEDED WHEN TAKING FUROSEMIDE   traMADol (ULTRAM) 50 MG tablet TAKE 1 TABLET BY MOUTH EVERY 8 HOURS AS NEEDED FOR MODERATE PAIN   Trolamine Salicylate (  ASPERCREME EX) Apply 1 application topically daily as needed (pain).   Turmeric (QC TUMERIC COMPLEX PO) Take by mouth.   [DISCONTINUED] furosemide (LASIX) 20 MG tablet Take 1 tablet (20 mg total) by mouth daily.     Allergies:   Sulfa antibiotics   Social History   Socioeconomic History   Marital status: Married    Spouse name: Not on file   Number of children: Not on file   Years of education: Not on file   Highest education level: Not on file  Occupational History   Not on file  Tobacco Use   Smoking  status: Never   Smokeless tobacco: Never  Vaping Use   Vaping status: Never Used  Substance and Sexual Activity   Alcohol use: No   Drug use: No   Sexual activity: Not on file  Other Topics Concern   Not on file  Social History Narrative   Not on file   Social Determinants of Health   Financial Resource Strain: Low Risk  (08/12/2022)   Overall Financial Resource Strain (CARDIA)    Difficulty of Paying Living Expenses: Not hard at all  Food Insecurity: No Food Insecurity (08/12/2022)   Hunger Vital Sign    Worried About Running Out of Food in the Last Year: Never true    Ran Out of Food in the Last Year: Never true  Transportation Needs: No Transportation Needs (08/12/2022)   PRAPARE - Administrator, Civil Service (Medical): No    Lack of Transportation (Non-Medical): No  Physical Activity: Inactive (08/12/2022)   Exercise Vital Sign    Days of Exercise per Week: 0 days    Minutes of Exercise per Session: 0 min  Stress: No Stress Concern Present (08/12/2022)   [employer] of Occupational Health - Occupational Stress Questionnaire    Feeling of Stress : Not at all  Social Connections: Moderately Integrated (08/12/2022)   Social Connection and Isolation Panel [NHANES]    Frequency of Communication with Friends and Family: More than three times a week    Frequency of Social Gatherings with Friends and Family: Twice a week    Attends Religious Services: 1 to 4 times per year    Active Member of [employer] or Organizations: No    Attends Banker Meetings: Never    Marital Status: Married     Family History: The patient's family history includes AAA (abdominal aortic aneurysm) in her maternal uncle; Aneurysm in her mother; Breast cancer in her cousin; Colon cancer in her paternal uncle; Heart disease in her brother and father.  ROS:   Review of Systems  Constitution: Negative for decreased appetite, fever and weight gain.  HENT: Negative for congestion, ear  discharge, hoarse voice and sore throat.   Eyes: Negative for discharge, redness, vision loss in right eye and visual halos.  Cardiovascular: Bilateral leg swelling.  Negative for chest pain, dyspnea on exertion, orthopnea and palpitations.  Respiratory: Negative for cough, hemoptysis, shortness of breath and snoring.   Endocrine: Negative for heat intolerance and polyphagia.  Hematologic/Lymphatic: Negative for bleeding problem. Does not bruise/bleed easily.  Skin: Negative for flushing, nail changes, rash and suspicious lesions.  Musculoskeletal: Negative for arthritis, joint pain, muscle cramps, myalgias, neck pain and stiffness.  Gastrointestinal: Negative for abdominal pain, bowel incontinence, diarrhea and excessive appetite.  Genitourinary: Negative for decreased libido, genital sores and incomplete emptying.  Neurological: Negative for brief paralysis, focal weakness, headaches and loss of balance.  Psychiatric/Behavioral: Negative for  altered mental status, depression and suicidal ideas.  Allergic/Immunologic: Negative for HIV exposure and persistent infections.    EKGs/Labs/Other Studies Reviewed:    The following studies were reviewed today:   EKG: None today  Zio monitor Patch Wear Time:  3 days and 1 hours starting June 18, 2021. Indication: Paroxysmal atrial fibrillation.     Patient had a min HR of 48 bpm, max HR of 154 bpm, and avg HR of 63 bpm.    Predominant underlying rhythm was Sinus Rhythm. Bundle Branch Block-interventricular conduction delay was present.    1583 Supraventricular Tachycardia runs occurred, the run with the fastest interval lasting 5.1 secs with a max rate of 154 bpm, the longest lasting 32.8 secs with an avg rate of 115 bpm.    Premature atrial complexes were rare (<1.0%). Premature ventricular complexes were rare (<1.0%).   No patient triggered events or diary events reported.      Conclusion: Paroxysmal supraventricular tachycardia which  is suspected to be atrial tachycardia with variable block.  Echocardiogram June 09, 2021 IMPRESSIONS     1. Left ventricular ejection fraction, by estimation, is 55 to 60%. The  left ventricle has normal function. The left ventricle has no regional  wall motion abnormalities. Left ventricular diastolic parameters are  indeterminate. Elevated left ventricular  end-diastolic pressure. The average left ventricular global longitudinal  strain is -21.2 %. The global longitudinal strain is normal.   2. Right ventricular systolic function is normal. The right ventricular  size is normal. There is mildly elevated pulmonary artery systolic  pressure. The estimated right ventricular systolic pressure is 40.2 mmHg.   3. The mitral valve is normal in structure. Mild mitral valve  regurgitation. No evidence of mitral stenosis.   4. Tricuspid valve regurgitation is mild to moderate.   5. The aortic valve is normal in structure. Aortic valve regurgitation is  not visualized. No aortic stenosis is present.   6. The inferior vena cava is normal in size with greater than 50%  respiratory variability, suggesting right atrial pressure of 3 mmHg.   FINDINGS   Left Ventricle: Left ventricular ejection fraction, by estimation, is 55  to 60%. The left ventricle has normal function. The left ventricle has no  regional wall motion abnormalities. The average left ventricular global  longitudinal strain is -21.2 %.  The global longitudinal strain is normal. The left ventricular internal  cavity size was normal in size. There is no left ventricular hypertrophy.  Left ventricular diastolic parameters are indeterminate. Elevated left  ventricular end-diastolic pressure.   Right Ventricle: The right ventricular size is normal. No increase in  right ventricular wall thickness. Right ventricular systolic function is  normal. There is mildly elevated pulmonary artery systolic pressure. The  tricuspid regurgitant  velocity is 3.05   m/s, and with an assumed right atrial pressure of 3 mmHg, the estimated  right ventricular systolic pressure is 40.2 mmHg.   Left Atrium: Left atrial size was normal in size.   Right Atrium: Right atrial size was normal in size.   Pericardium: There is no evidence of pericardial effusion.   Mitral Valve: The mitral valve is normal in structure. Mild mitral valve  regurgitation. No evidence of mitral valve stenosis.   Tricuspid Valve: The tricuspid valve is normal in structure. Tricuspid  valve regurgitation is mild to moderate. No evidence of tricuspid  stenosis.   Aortic Valve: The aortic valve is normal in structure. Aortic valve  regurgitation is not visualized. No aortic  stenosis is present.   Pulmonic Valve: The pulmonic valve was normal in structure. Pulmonic valve  regurgitation is trivial. No evidence of pulmonic stenosis.   Aorta: The aortic root is normal in size and structure.   Venous: The inferior vena cava is normal in size with greater than 50%  respiratory variability, suggesting right atrial pressure of 3 mmHg.   IAS/Shunts: No atrial level shunt detected by color flow Doppler.   Recent Labs: 01/18/2023: TSH 2.76 07/19/2023: Hemoglobin 9.9; Platelets 226.0 07/21/2023: Pro B Natriuretic peptide (BNP) 196.0 10/20/2023: ALT 9; BUN 15; Creatinine, Ser 1.16; Magnesium 2.0; Potassium 4.6; Sodium 141  Recent Lipid Panel    Component Value Date/Time   CHOL 157 01/18/2023 1344   TRIG 66.0 01/18/2023 1344   HDL 65.20 01/18/2023 1344   CHOLHDL 2 01/18/2023 1344   VLDL 13.2 01/18/2023 1344   LDLCALC 79 01/18/2023 1344    Physical Exam:    VS:  BP (!) 144/62 (BP Location: Right Arm, Patient Position: Sitting, Cuff Size: Normal)   Pulse (!) 48   Ht 4\' 8"  (1.422 m)   Wt 145 lb (65.8 kg)   SpO2 96%   BMI 32.51 kg/m     Wt Readings from Last 3 Encounters:  10/20/23 145 lb (65.8 kg)  07/26/23 144 lb 9.6 oz (65.6 kg)  07/19/23 148 lb 3.2 oz  (67.2 kg)     GEN: Well nourished, well developed in no acute distress HEENT: Normal NECK: No JVD; No carotid bruits LYMPHATICS: No lymphadenopathy CARDIAC: S1S2 noted,RRR, no murmurs, rubs, gallops RESPIRATORY:  Clear to auscultation without rales, wheezing or rhonchi  ABDOMEN: Soft, non-tender, non-distended, +bowel sounds, no guarding. EXTREMITIES: +2 bilateral lower extremity edema, No cyanosis, no clubbing MUSCULOSKELETAL:  No deformity  SKIN: Warm and dry NEUROLOGIC:  Alert and oriented x 3, non-focal PSYCHIATRIC:  Normal affect, good insight  ASSESSMENT:    1. Medication management   2. PAF (paroxysmal atrial fibrillation) (HCC)   3. Venous insufficiency of both lower extremities    PLAN:    Venous Insufficiency Chronic leg swelling, previously responsive to increased Lasix dosage. Noted varicose veins and history of hip infusion. -Increase Lasix to 40mg  daily from 20mg . -Refer to vascular and vein specialist for evaluation and potential vein stripping. -Check potassium levels due to increased Lasix dosage.  Atrial Fibrillation On Eliquis, patient reported dizziness and potential rhythm disturbances. -Continue Eliquis, monitor for dizziness. -Check for Eliquis samples due to patient's financial constraints.  General Health Maintenance / Followup Plans -Follow up in 16 weeks to assess response to increased Lasix dosage and potential need for vein specialist intervention.  The patient is in agreement with the above plan. The patient left the office in stable condition.  The patient will follow up in  12 weeks  or sooner if needed.   Medication Adjustments/Labs and Tests Ordered: Current medicines are reviewed at length with the patient today.  Concerns regarding medicines are outlined above.  Orders Placed This Encounter  Procedures   Comprehensive Metabolic Panel (CMET)   Magnesium   Meds ordered this encounter  Medications   furosemide (LASIX) 40 MG tablet     Sig: Take 1 tablet (40 mg total) by mouth daily.    Dispense:  90 tablet    Refill:  3   apixaban (ELIQUIS) 5 MG TABS tablet    Sig: Take 1 tablet (5 mg total) by mouth 2 (two) times daily.    Lot#: [id number] EXP: 01/26    Order  Specific Question:   Lot Number?    Answer:   [id number]    Order Specific Question:   Expiration Date?    Answer:   01/04/2025    Patient Instructions  Medication Instructions:  Your physician has recommended you make the following change in your medication:  START: Lasix 40 mg once daily  *If you need a refill on your cardiac medications before your next appointment, please call your pharmacy*   Lab Work: CMET, Mag If you have labs (blood work) drawn today and your tests are completely normal, you will receive your results only by: MyChart Message (if you have MyChart) OR A paper copy in the mail If you have any lab test that is abnormal or we need to change your treatment, we will call you to review the results.   Follow-Up: At [facility], you and your health needs are our priority.  As part of our continuing mission to provide you with exceptional heart care, we have created designated Provider Care Teams.  These Care Teams include your primary Cardiologist (physician) and Advanced Practice Providers (APPs -  Physician Assistants and Nurse Practitioners) who all work together to provide you with the care you need, when you need it.  We recommend signing up for the patient portal called "MyChart".  Sign up information is provided on this After Visit Summary.  MyChart is used to connect with patients for Virtual Visits (Telemedicine).  Patients are able to view lab/test results, encounter notes, upcoming appointments, etc.  Non-urgent messages can be sent to your provider as well.   To learn more about what you can do with MyChart, go to [email or web address].    Your next appointment:   16 week(s)  Provider:   Thomasene Ripple, DO    Adopting a  Healthy Lifestyle.  Know what a healthy weight is for you (roughly BMI <25) and aim to maintain this   Aim for 7+ servings of fruits and vegetables daily   65-80+ fluid ounces of water or unsweet tea for healthy kidneys   Limit to max 1 drink of alcohol per day; avoid smoking/tobacco   Limit animal fats in diet for cholesterol and heart health - choose grass fed whenever available   Avoid highly processed foods, and foods high in saturated/trans fats   Aim for low stress - take time to unwind and care for your mental health   Aim for 150 min of moderate intensity exercise weekly for heart health, and weights twice weekly for bone health   Aim for 7-9 hours of sleep daily   When it comes to diets, agreement about the perfect plan isnt easy to find, even among the experts. Experts at the [facility] [employer] developed an idea known as the Healthy Eating Plate. Just imagine a plate divided into logical, healthy portions.   The emphasis is on diet quality:   Load up on vegetables and fruits - one-half of your plate: Aim for color and variety, and remember that potatoes dont count.   Go for whole grains - one-quarter of your plate: Whole wheat, barley, wheat berries, quinoa, oats, brown rice, and foods made with them. If you want pasta, go with whole wheat pasta.   Protein power - one-quarter of your plate: Fish, chicken, beans, and nuts are all healthy, versatile protein sources. Limit red meat.   The diet, however, does go beyond the plate, offering a few other suggestions.   Use healthy plant oils, such as  olive, canola, soy, corn, sunflower and peanut. Check the labels, and avoid partially hydrogenated oil, which have unhealthy trans fats.   If youre thirsty, drink water. Coffee and tea are good in moderation, but skip sugary drinks and limit milk and dairy products to one or two daily servings.   The type of carbohydrate in the diet is more important than the  amount. Some sources of carbohydrates, such as vegetables, fruits, whole grains, and beans-are healthier than others.   Finally, stay active  Signed, Thomasene Ripple, DO  10/23/2023 8:27 PM    [facility]

## 2023-10-23 NOTE — Telephone Encounter (Signed)
Phone call patient was returning was for spouse Kathy Thornton(MRN [id number]) in regards to med samples. [name] was seen Friday so daughter thought vm was about her. Informed her samples for [name] ready at front desk for pick up. Verbalized understanding. No further questions at this time.

## 2023-10-23 NOTE — Telephone Encounter (Signed)
Patient's daughter is returning call to go over results. Please advise.

## 2023-11-23 ENCOUNTER — Other Ambulatory Visit: Payer: Self-pay | Admitting: Family Medicine

## 2023-11-23 DIAGNOSIS — R600 Localized edema: Secondary | ICD-10-CM

## 2023-12-07 ENCOUNTER — Other Ambulatory Visit: Payer: Self-pay | Admitting: Family Medicine

## 2023-12-07 DIAGNOSIS — M170 Bilateral primary osteoarthritis of knee: Secondary | ICD-10-CM

## 2024-01-14 NOTE — Progress Notes (Deleted)
 [facility] [address] [place], [place] [address] [phone]  Date:  01/17/2024   Name:  Kathy Thornton   DOB:  1934-08-06   MRN:  [id number]  PCP:  Pearline Cables, MD    Chief Complaint: No chief complaint on file.   History of Present Illness:  Kathy Thornton is a 89 y.o. very pleasant female patient who presents with the following:  Pt seen today for periodic recheck Last seen by myself in August at which time she was dealing with some leg swelling and recently suffered a fall at home  History of breast cancer 2018, chronic arthritis pain, osteoporosis, chronic renal insufficiency, atrial fibrillation. She had an ulcer in 2018 or 2019 due to NSAID use, we now use tramadol for her knee pain-   Need to recheck CBC Flu shot Shingles vaccine RSV Recommend covid booster  Patient Active Problem List   Diagnosis Date Noted   Knee pain 05/17/2021   Cataract 05/17/2021   Genetic testing 03/27/2018   Family history of colon cancer    Family history of breast cancer    Malignant neoplasm of upper-inner quadrant of left breast in female, estrogen receptor positive (HCC) 01/02/2018   Osteoporosis 01/02/2018   Cameron lesion, acute: Per EGD 11/06/2015 11/06/2015   Erosive gastritis: per EGD 11/05/2105 11/06/2015   Hemorrhoids; Grade 1 per colonoscopy 11/06/2015 11/06/2015   Anemia, iron deficiency    Acute gastric ulcer    Anemia 11/05/2015   Symptomatic anemia 11/05/2015   Heart murmur 11/05/2015   Lower urinary tract infectious disease 11/05/2015   Lower extremity edema 11/05/2015   Arthritis of both knees 11/04/2015    Past Medical History:  Diagnosis Date   Breast cancer (HCC) 2019   Left Breast Cancer   Breast cancer in female [facility])    Left   Cameron lesion, acute: Per EGD 11/06/2015 11/06/2015   Cataract    Erosive gastritis: per EGD 11/05/2105 11/06/2015   Family history of breast cancer    Family  history of colon cancer    Heart murmur    Hemorrhoids; Grade 1 per colonoscopy 11/06/2015 11/06/2015   Knee pain     Past Surgical History:  Procedure Laterality Date   BREAST LUMPECTOMY Left 03/30/2018   BREAST LUMPECTOMY WITH RADIOACTIVE SEED LOCALIZATION Left 03/30/2018   Procedure: RADIOACTIVE SEED GUIDED LEFT BREAST LUMPECTOMY;  Surgeon: Claud Kelp, MD;  Location: [facility] OR;  Service: General;  Laterality: Left;   COLONOSCOPY WITH PROPOFOL N/A 11/06/2015   Procedure: COLONOSCOPY WITH PROPOFOL;  Surgeon: Meryl Dare, MD;  Location: [facility];  Service: Endoscopy;  Laterality: N/A;   ESOPHAGOGASTRODUODENOSCOPY (EGD) WITH PROPOFOL N/A 11/06/2015   Procedure: ESOPHAGOGASTRODUODENOSCOPY (EGD) WITH PROPOFOL;  Surgeon: Meryl Dare, MD;  Location: [facility];  Service: Endoscopy;  Laterality: N/A;   EYE SURGERY      Social History   Tobacco Use   Smoking status: Never   Smokeless tobacco: Never  Vaping Use   Vaping status: Never Used  Substance Use Topics   Alcohol use: No   Drug use: No    Family History  Problem Relation Age of Onset   Aneurysm Mother        brain   Heart disease Father    Heart disease Brother    AAA (abdominal aortic aneurysm) Maternal Uncle    Colon cancer Paternal Uncle        dx >  50   Breast cancer Cousin        pat cousin, dx in her late 87s    Allergies  Allergen Reactions   Sulfa Antibiotics Hives    Medication list has been reviewed and updated.  Current Outpatient Medications on File Prior to Visit  Medication Sig Dispense Refill   apixaban (ELIQUIS) 5 MG TABS tablet TAKE 1 TABLET BY MOUTH TWICE DAILY. KEEP APPOINTMENT 60 tablet 5   apixaban (ELIQUIS) 5 MG TABS tablet Take 1 tablet (5 mg total) by mouth 2 (two) times daily.     Ascorbic Acid (VITAMIN C) 500 MG CAPS Take 500 mg by mouth daily.     Cholecalciferol (VITAMIN D3 PO) Take by mouth daily.     Cinnamon 500 MG capsule Take 1 capsule (500 mg total) by mouth daily.      furosemide (LASIX) 40 MG tablet Take 1 tablet (40 mg total) by mouth daily. 90 tablet 3   metoprolol succinate (TOPROL-XL) 25 MG 24 hr tablet Take 1 tablet (25 mg total) by mouth daily. 90 tablet 3   Multiple Vitamins-Minerals (WOMENS MULTIVITAMIN PO) Take by mouth.     pantoprazole (PROTONIX) 40 MG tablet Take 1 tablet (40 mg total) by mouth daily. 90 tablet 1   potassium chloride (KLOR-CON) 10 MEQ tablet TAKE 1 TABLET BY MOUTH DAILY AS NEEDED WHEN TAKING FUROSEMIDE 90 tablet 0   traMADol (ULTRAM) 50 MG tablet TAKE 1 TABLET BY MOUTH EVERY 8 HOURS AS NEEDED FOR MODERATE PAIN 90 tablet 1   Trolamine Salicylate (ASPERCREME EX) Apply 1 application topically daily as needed (pain).     Turmeric (QC TUMERIC COMPLEX PO) Take by mouth.     No current facility-administered medications on file prior to visit.    Review of Systems:  As per HPI- otherwise negative.   Physical Examination: There were no vitals filed for this visit. There were no vitals filed for this visit. There is no height or weight on file to calculate BMI. Ideal Body Weight:    GEN: no acute distress. HEENT: Atraumatic, Normocephalic.  Ears and Nose: No external deformity. CV: RRR, No M/G/R. No JVD. No thrill. No extra heart sounds. PULM: CTA B, no wheezes, crackles, rhonchi. No retractions. No resp. distress. No accessory muscle use. ABD: S, NT, ND, +BS. No rebound. No HSM. EXTR: No c/c/e PSYCH: Normally interactive. Conversant.    Assessment and Plan: ***  Signed Abbe Amsterdam, MD

## 2024-01-17 ENCOUNTER — Ambulatory Visit: Payer: Medicare Other | Admitting: Family Medicine

## 2024-01-18 ENCOUNTER — Other Ambulatory Visit: Payer: Self-pay | Admitting: Family Medicine

## 2024-01-18 DIAGNOSIS — Z8711 Personal history of peptic ulcer disease: Secondary | ICD-10-CM

## 2024-01-18 DIAGNOSIS — I480 Paroxysmal atrial fibrillation: Secondary | ICD-10-CM

## 2024-01-27 NOTE — Progress Notes (Unsigned)
 [facility] [address] [place], [place] [address] [phone]  Date:  01/31/2024   Name:  Kathy Thornton   DOB:  [birth year]   MRN:  [id number]  PCP:  Pearline Cables, MD    Chief Complaint: No chief complaint on file.   History of Present Illness:  Kathy Thornton is a 89 y.o. very pleasant female patient who presents with the following:  Pt seen today for periodic recheck Last seen by myself in August at which time she was dealing with some leg swelling and recently suffered a fall at home  History of breast cancer 2018, chronic arthritis pain, osteoporosis, chronic renal insufficiency, atrial fibrillation. She had an ulcer in 2018 or 2019 due to NSAID use, we now use tramadol for her knee pain-   Need to recheck CBC Flu shot Shingles vaccine RSV Recommend covid booster   Eliquis 5 twice daily Toprol-XL 25 Pantoprazole Lasix, potassium as needed Tramadol every 8 hours as needed  She lives with her husband, their daughter Kathy Thornton is nearby, daughter Kathy Thornton is also involved  Patient Active Problem List   Diagnosis Date Noted   Knee pain 05/17/2021   Cataract 05/17/2021   Genetic testing 03/27/2018   Family history of colon cancer    Family history of breast cancer    Malignant neoplasm of upper-inner quadrant of left breast in female, estrogen receptor positive (HCC) 01/02/2018   Osteoporosis 01/02/2018   Cameron lesion, acute: Per EGD 11/06/2015 11/06/2015   Erosive gastritis: per EGD 11/05/2105 11/06/2015   Hemorrhoids; Grade 1 per colonoscopy 11/06/2015 11/06/2015   Anemia, iron deficiency    Acute gastric ulcer    Anemia 11/05/2015   Symptomatic anemia 11/05/2015   Heart murmur 11/05/2015   Lower urinary tract infectious disease 11/05/2015   Lower extremity edema 11/05/2015   Arthritis of both knees 11/04/2015    Past Medical History:  Diagnosis Date   Breast cancer (HCC) 2019   Left Breast  Cancer   Breast cancer in female [facility])    Left   Cameron lesion, acute: Per EGD 11/06/2015 11/06/2015   Cataract    Erosive gastritis: per EGD 11/05/2105 11/06/2015   Family history of breast cancer    Family history of colon cancer    Heart murmur    Hemorrhoids; Grade 1 per colonoscopy 11/06/2015 11/06/2015   Knee pain     Past Surgical History:  Procedure Laterality Date   BREAST LUMPECTOMY Left 03/30/2018   BREAST LUMPECTOMY WITH RADIOACTIVE SEED LOCALIZATION Left 03/30/2018   Procedure: RADIOACTIVE SEED GUIDED LEFT BREAST LUMPECTOMY;  Surgeon: Claud Kelp, MD;  Location: [facility] OR;  Service: General;  Laterality: Left;   COLONOSCOPY WITH PROPOFOL N/A 11/06/2015   Procedure: COLONOSCOPY WITH PROPOFOL;  Surgeon: Meryl Dare, MD;  Location: [facility]COPY;  Service: Endoscopy;  Laterality: N/A;   ESOPHAGOGASTRODUODENOSCOPY (EGD) WITH PROPOFOL N/A 11/06/2015   Procedure: ESOPHAGOGASTRODUODENOSCOPY (EGD) WITH PROPOFOL;  Surgeon: Meryl Dare, MD;  Location: [facility];  Service: Endoscopy;  Laterality: N/A;   EYE SURGERY      Social History   Tobacco Use   Smoking status: Never   Smokeless tobacco: Never  Vaping Use   Vaping status: Never Used  Substance Use Topics   Alcohol use: No   Drug use: No    Family History  Problem Relation Age of Onset   Aneurysm Mother        brain  Heart disease Father    Heart disease Brother    AAA (abdominal aortic aneurysm) Maternal Uncle    Colon cancer Paternal Uncle        dx >50   Breast cancer Cousin        pat cousin, dx in her late 29s    Allergies  Allergen Reactions   Sulfa Antibiotics Hives    Medication list has been reviewed and updated.  Current Outpatient Medications on File Prior to Visit  Medication Sig Dispense Refill   apixaban (ELIQUIS) 5 MG TABS tablet TAKE 1 TABLET BY MOUTH TWICE DAILY. KEEP APPOINTMENT 60 tablet 5   apixaban (ELIQUIS) 5 MG TABS tablet Take 1 tablet (5 mg total) by mouth 2 (two) times  daily.     Ascorbic Acid (VITAMIN C) 500 MG CAPS Take 500 mg by mouth daily.     Cholecalciferol (VITAMIN D3 PO) Take by mouth daily.     Cinnamon 500 MG capsule Take 1 capsule (500 mg total) by mouth daily.     furosemide (LASIX) 40 MG tablet Take 1 tablet (40 mg total) by mouth daily. 90 tablet 3   metoprolol succinate (TOPROL-XL) 25 MG 24 hr tablet TAKE 1 TABLET(25 MG) BY MOUTH DAILY 90 tablet 3   Multiple Vitamins-Minerals (WOMENS MULTIVITAMIN PO) Take by mouth.     pantoprazole (PROTONIX) 40 MG tablet TAKE 1 TABLET BY MOUTH DAILY 90 tablet 1   potassium chloride (KLOR-CON) 10 MEQ tablet TAKE 1 TABLET BY MOUTH DAILY AS NEEDED WHEN TAKING FUROSEMIDE 90 tablet 0   traMADol (ULTRAM) 50 MG tablet TAKE 1 TABLET BY MOUTH EVERY 8 HOURS AS NEEDED FOR MODERATE PAIN 90 tablet 1   Trolamine Salicylate (ASPERCREME EX) Apply 1 application topically daily as needed (pain).     Turmeric (QC TUMERIC COMPLEX PO) Take by mouth.     No current facility-administered medications on file prior to visit.    Review of Systems:  As per HPI- otherwise negative.   Physical Examination: There were no vitals filed for this visit. There were no vitals filed for this visit. There is no height or weight on file to calculate BMI. Ideal Body Weight:    GEN: no acute distress. HEENT: Atraumatic, Normocephalic.  Ears and Nose: No external deformity. CV: RRR, No M/G/R. No JVD. No thrill. No extra heart sounds. PULM: CTA B, no wheezes, crackles, rhonchi. No retractions. No resp. distress. No accessory muscle use. ABD: S, NT, ND, +BS. No rebound. No HSM. EXTR: No c/c/e PSYCH: Normally interactive. Conversant.    Assessment and Plan: ***  Signed Abbe Amsterdam, MD

## 2024-01-31 ENCOUNTER — Ambulatory Visit (INDEPENDENT_AMBULATORY_CARE_PROVIDER_SITE_OTHER): Payer: Medicare Other | Admitting: Family Medicine

## 2024-01-31 VITALS — BP 142/78 | HR 60 | Temp 98.7°F | Resp 18 | Ht <= 58 in | Wt 144.0 lb

## 2024-01-31 DIAGNOSIS — Z170 Estrogen receptor positive status [ER+]: Secondary | ICD-10-CM

## 2024-01-31 DIAGNOSIS — C50212 Malignant neoplasm of upper-inner quadrant of left female breast: Secondary | ICD-10-CM | POA: Diagnosis not present

## 2024-01-31 DIAGNOSIS — M170 Bilateral primary osteoarthritis of knee: Secondary | ICD-10-CM

## 2024-01-31 DIAGNOSIS — D619 Aplastic anemia, unspecified: Secondary | ICD-10-CM

## 2024-01-31 DIAGNOSIS — I4891 Unspecified atrial fibrillation: Secondary | ICD-10-CM | POA: Diagnosis not present

## 2024-01-31 DIAGNOSIS — D649 Anemia, unspecified: Secondary | ICD-10-CM | POA: Diagnosis not present

## 2024-01-31 LAB — BASIC METABOLIC PANEL
BUN: 27 mg/dL — ABNORMAL HIGH (ref 6–23)
CO2: 25 meq/L (ref 19–32)
Calcium: 9 mg/dL (ref 8.4–10.5)
Chloride: 102 meq/L (ref 96–112)
Creatinine, Ser: 1.26 mg/dL — ABNORMAL HIGH (ref 0.40–1.20)
GFR: 38.26 mL/min — ABNORMAL LOW (ref >60.00)
Glucose, Bld: 98 mg/dL (ref 70–99)
Potassium: 4.2 meq/L (ref 3.5–5.1)
Sodium: 137 meq/L (ref 135–145)

## 2024-01-31 LAB — CBC
HCT: 30.2 % — ABNORMAL LOW (ref 36.0–46.0)
Hemoglobin: 9.7 g/dL — ABNORMAL LOW (ref 12.0–15.0)
MCHC: 32.2 g/dL (ref 30.0–36.0)
MCV: 85.9 fl (ref 78.0–100.0)
Platelets: 159 10*3/uL (ref 150.0–400.0)
RBC: 3.51 Mil/uL — ABNORMAL LOW (ref 3.87–5.11)
RDW: 15.8 % — ABNORMAL HIGH (ref 11.5–15.5)
WBC: 5.6 10*3/uL (ref 4.0–10.5)

## 2024-01-31 NOTE — Patient Instructions (Signed)
 It was great to see you today- I will be in touch with your labs Recommend dose of RSV at your pharmacy You are doing great!

## 2024-02-01 NOTE — Addendum Note (Signed)
 Addended by: Abbe Amsterdam C on: 02/01/2024 12:16 PM   Modules accepted: Orders

## 2024-02-07 ENCOUNTER — Other Ambulatory Visit (INDEPENDENT_AMBULATORY_CARE_PROVIDER_SITE_OTHER): Payer: Medicare Other

## 2024-02-07 DIAGNOSIS — D649 Anemia, unspecified: Secondary | ICD-10-CM | POA: Diagnosis not present

## 2024-02-07 LAB — CBC WITH DIFFERENTIAL/PLATELET
Absolute Lymphocytes: 2303 {cells}/uL (ref 850–3900)
Absolute Monocytes: 516 {cells}/uL (ref 200–950)
Basophils Absolute: 52 {cells}/uL (ref 0–200)
Basophils Relative: 0.9 %
Eosinophils Absolute: 162 {cells}/uL (ref 15–500)
Eosinophils Relative: 2.8 %
HCT: 30 % — ABNORMAL LOW (ref 35.0–45.0)
Hemoglobin: 9.3 g/dL — ABNORMAL LOW (ref 11.7–15.5)
MCH: 26.9 pg — ABNORMAL LOW (ref 27.0–33.0)
MCHC: 31 g/dL — ABNORMAL LOW (ref 32.0–36.0)
MCV: 86.7 fL (ref 80.0–100.0)
MPV: 11.6 fL (ref 7.5–12.5)
Monocytes Relative: 8.9 %
Neutro Abs: 2767 {cells}/uL (ref 1500–7800)
Neutrophils Relative %: 47.7 %
Platelets: 183 10*3/uL (ref 140–400)
RBC: 3.46 10*6/uL — ABNORMAL LOW (ref 3.80–5.10)
RDW: 14.4 % (ref 11.0–15.0)
Total Lymphocyte: 39.7 %
WBC: 5.8 10*3/uL (ref 3.8–10.8)

## 2024-02-07 LAB — VITAMIN B12: Vitamin B-12: 178 pg/mL — ABNORMAL LOW (ref 211–911)

## 2024-02-07 LAB — FECAL OCCULT BLOOD, IMMUNOCHEMICAL: Fecal Occult Bld: POSITIVE — AB

## 2024-02-07 LAB — FOLATE: Folate: 21.6 ng/mL (ref >5.9)

## 2024-02-07 LAB — FERRITIN: Ferritin: 10.1 ng/mL (ref 10.0–291.0)

## 2024-02-08 ENCOUNTER — Telehealth: Payer: Self-pay | Admitting: Family Medicine

## 2024-02-08 DIAGNOSIS — K921 Melena: Secondary | ICD-10-CM

## 2024-02-08 NOTE — Telephone Encounter (Signed)
 Called and discussed with her daughter Aram Beecham.  It looks like she is losing blood through the GI system- history of upper GI bleed several years ago.  They have not noted any blood in her stool or any abd pain, no vomiting Will place urgent GI referral as she will need to re-establish If she is not feeling well or  apparent bleeding in the meantime they will alert me Advised B12 is low- start on OCT B12 daily

## 2024-02-09 ENCOUNTER — Encounter: Payer: Self-pay | Admitting: Physician Assistant

## 2024-02-13 NOTE — Progress Notes (Unsigned)
 02/14/2024 Kathy Thornton [id number] 1934-10-05  Referring provider: Pearline Cables, MD Primary GI doctor: Dr. Carloyn Manner (Dr. Christella Hartigan)  ASSESSMENT AND PLAN:   IDA  with history of previously 2016 secondary to gastric ulcer in setting of NSAID use Cameron ulcers and 6 mm hiatal hernia, history of hemorrhoid's With FOBT + stool 02/26, negative in the office patient's had normocytic anemia increasing over the last year Hgb was 11.2 on 07/20/2022 decreased down to 9.9 07/19/2023 and most recent 02/07/2024 9.3 2/26 Ferritin 10 no iron level checked  B12 low at 178, folate normal  history of CKD No overt GI bleeding, has had some epigastric and RUQ pain, LUQ discomfort.  - continue protonix daily -will get iron and ferritin again,  suggest B12 replacement, suggest checking EPO  -LONG discussion with the patient about IDA and EGD/colon, I think she would be appropriate for both however she prefers to avoid aggressive treatment if diagnosed with colon cancer, influencing decision.  -She will proceed with just EGD at this time, does not want a colonoscopy after a long discussion, she is well aware that this could be missing colon cancer/AVM's Should be appropriate at [facility] from discussion with patient and chart review, get clearance from Dr. Servando Salina I discussed risks of EGD with patient today, including risk of sedation, bleeding or perforation.  Patient provides understanding and gave verbal consent to proceed.  Atrial fibrillation On Eliquis, follows with Dr. Servando Salina Echo 2022 normal EF, no AS No chest pain, no SOB Hold Eliquis for 2 days before procedure will instruct when and how to resume after procedure.  Patient understands that there is a low but real risk of cardiovascular event such as heart attack, stroke, or embolism /  thrombosis, or ischemia while off Eliquis  The patient consents to proceed.  Will communicate by phone or EMR with patient's prescribing provider to confirm that  holding Eliquis is reasonable in this case.   B12 deficiency 2/26 B12 178  CKD stage 3 B   Patient Care Team: Copland, Gwenlyn Found, MD as PCP - General (Family Medicine) Thomasene Ripple, DO as PCP - Cardiology (Cardiology) Frederico Hamman, MD as Consulting Physician (Orthopedic Surgery) Iva Boop, MD as Consulting Physician (Gastroenterology) Magrinat, Valentino Hue, MD (Inactive) as Consulting Physician (Oncology) Claud Kelp, MD as Consulting Physician (General Surgery) Thomasene Ripple, DO as Consulting Physician (Cardiology)  HISTORY OF PRESENT ILLNESS: 89 y.o. female with a past medical history of osteoporosis, history of left breast cancer estrogen receptor +2019, atrial fibrillation on Eliquis ( echo 2022 EF 55 to 60% mildly faded pulmonary artery systolic pressure at 4040.2 mild MR no aortic stenosis), CKD  GFR 38, IDA 2016 with history of erosive gastritis and Cameron lesions with 6 cm hiatal hernia and gastric ulcer likely in setting of NSAID use, hemorrhoids and others listed below presents for evaluation of rectal bleeding  and IDA.   Discussed the use of AI scribe software for clinical note transcription with the patient, who gave verbal consent to proceed.  History of Present Illness   The patient presents with blood in stool. She is accompanied by Kathy Thornton, her family member. She was referred by Dr. Patsy Lager for evaluation of blood in stool.  A stool test revealed blood, although she denies noticing any blood in her stool or on toilet paper. She attributes the dark appearance of the stool sample to consuming blackberry jam and jelly the night before.   In 2016, she experienced a bleeding ulcer  and currently reports soreness in the area of a hiatal hernia. She experiences occasional right upper quadrant pain, particularly after consuming foods like popcorn or rice. She frequently experiences belching and gas but denies nausea, vomiting, or changes in bowel habits.  Her hemoglobin  levels have decreased from 11 to 9. She is not on an iron supplement but has started taking B12 due to a recent deficiency. She avoids NSAIDs like Aleve or ibuprofen, previously associated with her ulcers, and instead uses tramadol for pain management. She does not consume alcohol or smoke.  She has a history of atrial fibrillation and is on Eliquis, a blood thinner. She uses a cane for mobility due to knee and hip issues, which cause significant pain. Despite these issues, she remains active, vacuuming daily without experiencing chest pain or shortness of breath.     She  reports that she has never smoked. She has never used smokeless tobacco. She reports that she does not drink alcohol and does not use drugs.  RELEVANT GI HISTORY, IMAGING AND LABS: EGD Dr. Russella Dar 11/2015 for IDA in setting of NSAID and Goody powders 1.   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   Single ulcer in the gastric antrum; biopsies were taken (no h. Pylori) 3.   Erosive gastritis in the gastric body and gastric antrum 4.   6 cm hiatal hernia   Colonoscopy Dr. Russella Dar 11/2015 for IDA; hemorrhoids o/w normal  CBC    Component Value Date/Time   WBC 5.8 02/07/2024 1335   RBC 3.46 (L) 02/07/2024 1335   HGB 9.3 (L) 02/07/2024 1335   HGB 11.0 (L) 09/27/2022 1306   HCT 30.0 (L) 02/07/2024 1335   PLT 183 02/07/2024 1335   PLT 184 09/27/2022 1306   MCV 86.7 02/07/2024 1335   MCV 75.0 (A) 11/16/2015 1141   MCH 26.9 (L) 02/07/2024 1335   MCHC 31.0 (L) 02/07/2024 1335   RDW 14.4 02/07/2024 1335   LYMPHSABS 1.9 09/27/2022 1306   MONOABS 0.5 09/27/2022 1306   EOSABS 162 02/07/2024 1335   BASOSABS 52 02/07/2024 1335   Recent Labs    07/19/23 1328 01/31/24 1318 02/07/24 1335  HGB 9.9* 9.7* 9.3*    CMP     Component Value Date/Time   NA 137 01/31/2024 1318   NA 141 10/20/2023 1141   K 4.2 01/31/2024 1318   CL 102 01/31/2024 1318   CO2 25 01/31/2024 1318   GLUCOSE 98 01/31/2024 1318   BUN 27 (H)  01/31/2024 1318   BUN 15 10/20/2023 1141   CREATININE 1.26 (H) 01/31/2024 1318   CREATININE 1.10 (H) 09/27/2022 1306   CREATININE 1.11 (H) 01/19/2016 1126   CALCIUM 9.0 01/31/2024 1318   PROT 6.9 10/20/2023 1141   ALBUMIN 4.3 10/20/2023 1141   AST 14 10/20/2023 1141   AST 12 (L) 09/27/2022 1306   ALT 9 10/20/2023 1141   ALT 6 09/27/2022 1306   ALKPHOS 87 10/20/2023 1141   BILITOT 0.3 10/20/2023 1141   BILITOT 0.3 09/27/2022 1306   GFRNONAA 49 (L) 09/27/2022 1306   GFRAA 47 (L) 09/02/2020 1237   GFRAA 57 (L) 03/14/2018 1441      Latest Ref Rng & Units 10/20/2023   11:41 AM 07/19/2023    1:28 PM 09/27/2022    1:06 PM  Hepatic Function  Total Protein 6.0 - 8.5 g/dL 6.9  6.6  6.9   Albumin 3.7 - 4.7 g/dL 4.3  4.1  4.1   AST 0 - 40  IU/L 14  13  12    ALT 0 - 32 IU/L 9  7  6    Alk Phosphatase 44 - 121 IU/L 87  69  78   Total Bilirubin 0.0 - 1.2 mg/dL 0.3  0.3  0.3       Current Medications:    Current Outpatient Medications (Cardiovascular):    metoprolol succinate (TOPROL-XL) 25 MG 24 hr tablet, TAKE 1 TABLET(25 MG) BY MOUTH DAILY   furosemide (LASIX) 40 MG tablet, Take 1 tablet (40 mg total) by mouth daily.   Current Outpatient Medications (Analgesics):    traMADol (ULTRAM) 50 MG tablet, TAKE 1 TABLET BY MOUTH EVERY 8 HOURS AS NEEDED FOR MODERATE PAIN  Current Outpatient Medications (Hematological):    apixaban (ELIQUIS) 5 MG TABS tablet, TAKE 1 TABLET BY MOUTH TWICE DAILY. KEEP APPOINTMENT   vitamin B-12 (CYANOCOBALAMIN) 100 MCG tablet, Take 100 mcg by mouth daily.  Current Outpatient Medications (Other):    Ascorbic Acid (VITAMIN C) 500 MG CAPS, Take 500 mg by mouth daily.   Cholecalciferol (VITAMIN D3 PO), Take by mouth daily.   Cinnamon 500 MG capsule, Take 1 capsule (500 mg total) by mouth daily.   Multiple Vitamins-Minerals (WOMENS MULTIVITAMIN PO), Take by mouth.   pantoprazole (PROTONIX) 40 MG tablet, TAKE 1 TABLET BY MOUTH DAILY   potassium chloride  (KLOR-CON) 10 MEQ tablet, TAKE 1 TABLET BY MOUTH DAILY AS NEEDED WHEN TAKING FUROSEMIDE   Turmeric (QC TUMERIC COMPLEX PO), Take by mouth.  Medical History:  Past Medical History:  Diagnosis Date   Breast cancer (HCC) 2019   Left Breast Cancer   Breast cancer in female [facility])    Left   Sheria Lang lesion, acute: Per EGD 11/06/2015 11/06/2015   Cataract    Erosive gastritis: per EGD 11/05/2105 11/06/2015   Family history of breast cancer    Family history of colon cancer    Heart murmur    Hemorrhoids; Grade 1 per colonoscopy 11/06/2015 11/06/2015   Knee pain    Allergies:  Allergies  Allergen Reactions   Sulfa Antibiotics Hives     Surgical History:  She  has a past surgical history that includes Eye surgery; Esophagogastroduodenoscopy (egd) with propofol (N/A, 11/06/2015); Colonoscopy with propofol (N/A, 11/06/2015); Breast lumpectomy with radioactive seed localization (Left, 03/30/2018); and Breast lumpectomy (Left, 03/30/2018). Family History:  Her family history includes AAA (abdominal aortic aneurysm) in her maternal uncle; Aneurysm in her mother; Breast cancer in her cousin; Colon cancer in her paternal uncle; Heart disease in her brother and father.  REVIEW OF SYSTEMS  : All other systems reviewed and negative except where noted in the History of Present Illness.  PHYSICAL EXAM: BP 124/72   Pulse 67   Ht 4\' 9"  (1.448 m)   Wt 142 lb (64.4 kg)   BMI 30.73 kg/m  Physical Exam   GENERAL APPEARANCE: Well nourished, in no apparent distress. HEENT: No cervical lymphadenopathy, unremarkable thyroid, sclerae anicteric, conjunctiva pink. RESPIRATORY: Respiratory effort normal, breath sounds equal bilaterally without rales, rhonchi, or wheezing. CARDIO: Regular rate and rhythm with no murmurs, rubs, or gallops, peripheral pulses intact. ABDOMEN: Soft, non-distended, active bowel sounds in all four quadrants, no tenderness to palpation, no rebound, no mass appreciated. RECTAL: No masses  felt, stool present, no external hemorrhoids, hemoccult negative. MUSCULOSKELETAL: Full range of motion, normal gait, without edema. SKIN: Dry, intact without rashes or lesions. No jaundice. NEURO: Alert, oriented, no focal deficits. PSYCH: Cooperative, normal mood and affect.  Doree Albee, PA-C 11:36 AM

## 2024-02-14 ENCOUNTER — Other Ambulatory Visit: Payer: Self-pay | Admitting: Family Medicine

## 2024-02-14 ENCOUNTER — Telehealth: Payer: Self-pay | Admitting: *Deleted

## 2024-02-14 ENCOUNTER — Encounter: Payer: Self-pay | Admitting: Physician Assistant

## 2024-02-14 ENCOUNTER — Other Ambulatory Visit (INDEPENDENT_AMBULATORY_CARE_PROVIDER_SITE_OTHER)

## 2024-02-14 ENCOUNTER — Ambulatory Visit: Admitting: Physician Assistant

## 2024-02-14 VITALS — BP 124/72 | HR 67 | Ht <= 58 in | Wt 142.0 lb

## 2024-02-14 DIAGNOSIS — E538 Deficiency of other specified B group vitamins: Secondary | ICD-10-CM

## 2024-02-14 DIAGNOSIS — N1832 Chronic kidney disease, stage 3b: Secondary | ICD-10-CM | POA: Diagnosis not present

## 2024-02-14 DIAGNOSIS — R1013 Epigastric pain: Secondary | ICD-10-CM | POA: Diagnosis not present

## 2024-02-14 DIAGNOSIS — I4891 Unspecified atrial fibrillation: Secondary | ICD-10-CM

## 2024-02-14 DIAGNOSIS — Z8711 Personal history of peptic ulcer disease: Secondary | ICD-10-CM

## 2024-02-14 DIAGNOSIS — D509 Iron deficiency anemia, unspecified: Secondary | ICD-10-CM | POA: Diagnosis not present

## 2024-02-14 DIAGNOSIS — K649 Unspecified hemorrhoids: Secondary | ICD-10-CM

## 2024-02-14 DIAGNOSIS — D500 Iron deficiency anemia secondary to blood loss (chronic): Secondary | ICD-10-CM

## 2024-02-14 DIAGNOSIS — Z1231 Encounter for screening mammogram for malignant neoplasm of breast: Secondary | ICD-10-CM

## 2024-02-14 DIAGNOSIS — R1011 Right upper quadrant pain: Secondary | ICD-10-CM | POA: Diagnosis not present

## 2024-02-14 DIAGNOSIS — R1012 Left upper quadrant pain: Secondary | ICD-10-CM

## 2024-02-14 DIAGNOSIS — Z8719 Personal history of other diseases of the digestive system: Secondary | ICD-10-CM

## 2024-02-14 LAB — COMPREHENSIVE METABOLIC PANEL
ALT: 6 U/L (ref 0–35)
AST: 12 U/L (ref 0–37)
Albumin: 4.3 g/dL (ref 3.5–5.2)
Alkaline Phosphatase: 80 U/L (ref 39–117)
BUN: 17 mg/dL (ref 6–23)
CO2: 28 meq/L (ref 19–32)
Calcium: 9.6 mg/dL (ref 8.4–10.5)
Chloride: 102 meq/L (ref 96–112)
Creatinine, Ser: 1.14 mg/dL (ref 0.40–1.20)
GFR: 43.13 mL/min — ABNORMAL LOW (ref >60.00)
Glucose, Bld: 105 mg/dL — ABNORMAL HIGH (ref 70–99)
Potassium: 4.4 meq/L (ref 3.5–5.1)
Sodium: 138 meq/L (ref 135–145)
Total Bilirubin: 0.4 mg/dL (ref 0.2–1.2)
Total Protein: 7.4 g/dL (ref 6.0–8.3)

## 2024-02-14 LAB — CBC WITH DIFFERENTIAL/PLATELET
Basophils Absolute: 0.1 10*3/uL (ref 0.0–0.1)
Basophils Relative: 1.2 % (ref 0.0–3.0)
Eosinophils Absolute: 0.2 10*3/uL (ref 0.0–0.7)
Eosinophils Relative: 3.3 % (ref 0.0–5.0)
HCT: 30.9 % — ABNORMAL LOW (ref 36.0–46.0)
Hemoglobin: 9.8 g/dL — ABNORMAL LOW (ref 12.0–15.0)
Lymphocytes Relative: 31.7 % (ref 12.0–46.0)
Lymphs Abs: 1.9 10*3/uL (ref 0.7–4.0)
MCHC: 31.8 g/dL (ref 30.0–36.0)
MCV: 85.5 fl (ref 78.0–100.0)
Monocytes Absolute: 0.6 10*3/uL (ref 0.1–1.0)
Monocytes Relative: 9.4 % (ref 3.0–12.0)
Neutro Abs: 3.2 10*3/uL (ref 1.4–7.7)
Neutrophils Relative %: 54.4 % (ref 43.0–77.0)
Platelets: 181 10*3/uL (ref 150.0–400.0)
RBC: 3.62 Mil/uL — ABNORMAL LOW (ref 3.87–5.11)
RDW: 15.8 % — ABNORMAL HIGH (ref 11.5–15.5)
WBC: 5.9 10*3/uL (ref 4.0–10.5)

## 2024-02-14 LAB — IBC + FERRITIN
Ferritin: 9.8 ng/mL — ABNORMAL LOW (ref 10.0–291.0)
Iron: 29 ug/dL — ABNORMAL LOW (ref 42–145)
Saturation Ratios: 5.3 % — ABNORMAL LOW (ref 20.0–50.0)
TIBC: 543.2 ug/dL — ABNORMAL HIGH (ref 250.0–450.0)
Transferrin: 388 mg/dL — ABNORMAL HIGH (ref 212.0–360.0)

## 2024-02-14 NOTE — Patient Instructions (Addendum)
 You have been scheduled for an endoscopy. Please follow written instructions given to you at your visit today.  If you use inhalers (even only as needed), please bring them with you on the day of your procedure.  If you take any of the following medications, they will need to be adjusted prior to your procedure:   DO NOT TAKE 7 DAYS PRIOR TO TEST- Trulicity (dulaglutide) Ozempic, Wegovy (semaglutide) Mounjaro (tirzepatide) Bydureon Bcise (exanatide extended release)  DO NOT TAKE 1 DAY PRIOR TO YOUR TEST Rybelsus (semaglutide) Adlyxin (lixisenatide) Victoza (liraglutide) Byetta (exanatide) ___________________________________________________________________________  Bonita Quin will be contacted by our office prior to your procedure for directions on holding your ELIQUIS.  If you do not hear from our office 1 week prior to your scheduled procedure, please call [phone] to discuss.   Due to recent changes in healthcare laws, you may see the results of your imaging and laboratory studies on MyChart before your provider has had a chance to review them.  We understand that in some cases there may be results that are confusing or concerning to you. Not all laboratory results come back in the same time frame and the provider may be waiting for multiple results in order to interpret others.  Please give [place] 48 hours in order for your provider to thoroughly review all the results before contacting the office for clarification of your results.   Your provider has requested that you go to the basement level for lab work before leaving today. Press "B" on the elevator. The lab is located at the first door on the left as you exit the elevator.    I appreciate the  opportunity to care for you  Thank You   [facility]

## 2024-02-14 NOTE — Telephone Encounter (Signed)
 [facility] Pre-operative Risk Assessment     Request for surgical clearance:     Endoscopy Procedure  What type of surgery is being performed?     Endoscopy  When is this surgery scheduled?     04/04/2024  What type of clearance is required ?   Pharmacy  Are there any medications that need to be held prior to surgery and how long? Eliquis  Practice name and name of physician performing surgery?      [facility]  What is your office phone and fax number?      Phone- [phone]  Fax- [phone]  Anesthesia type (None, local, MAC, general) ?       MAC   Please route your response to Frederika Hukill or fax to [phone]   [phone]

## 2024-02-15 ENCOUNTER — Other Ambulatory Visit: Payer: Self-pay

## 2024-02-15 MED ORDER — FERROUS SULFATE 325 (65 FE) MG PO TABS: 325.0000 mg | ORAL_TABLET | Freq: Every day | ORAL | Status: DC

## 2024-02-16 LAB — ERYTHROPOIETIN: Erythropoietin: 41.7 m[IU]/mL — ABNORMAL HIGH (ref 2.6–18.5)

## 2024-02-16 NOTE — Telephone Encounter (Signed)
     Primary Cardiologist: Thomasene Ripple, DO  Clinical pharmacist have reviewed patient's chart as part of pre-operative protocol coverage.  The following recommendations have been made for , Kathy Thornton   Patient with diagnosis of atrial fibrillation on Eliquis for anticoagulation.     What type of surgery is being performed?     Endoscopy  When is this surgery scheduled?     04/04/2024      CHA2DS2-VASc Score = 3   This indicates a 3.2% annual risk of stroke. The patient's score is based upon: CHF History: 0 HTN History: 0 Diabetes History: 0 Stroke History: 0 Vascular Disease History: 0 Age Score: 2 Gender Score: 1   CrCl 35 Platelet count 181   Per office protocol, patient can hold Eliquis for 3 days prior to procedure.   Patient will not need bridging with Lovenox (enoxaparin) around procedure.  I will route this recommendation to the requesting party via Epic fax function and remove from pre-op pool.  Please call with questions.  Thomasene Ripple. Tiffany Calmes NP-C     02/16/2024, 3:37 PM [facility] [address] Office [phone] Fax [phone]

## 2024-02-16 NOTE — Telephone Encounter (Signed)
 Patient with diagnosis of atrial fibrillation on Eliquis for anticoagulation.    What type of surgery is being performed?     Endoscopy  When is this surgery scheduled?     04/04/2024    CHA2DS2-VASc Score = 3   This indicates a 3.2% annual risk of stroke. The patient's score is based upon: CHF History: 0 HTN History: 0 Diabetes History: 0 Stroke History: 0 Vascular Disease History: 0 Age Score: 2 Gender Score: 1   CrCl 35 Platelet count 181  Per office protocol, patient can hold Eliquis for 3 days prior to procedure.   Patient will not need bridging with Lovenox (enoxaparin) around procedure.  **This guidance is not considered finalized until pre-operative APP has relayed final recommendations.**

## 2024-02-19 ENCOUNTER — Ambulatory Visit: Payer: Medicare Other | Attending: Cardiology | Admitting: Cardiology

## 2024-02-19 ENCOUNTER — Encounter: Payer: Self-pay | Admitting: Cardiology

## 2024-02-19 VITALS — BP 128/58 | HR 61 | Ht <= 58 in | Wt 142.4 lb

## 2024-02-19 DIAGNOSIS — I480 Paroxysmal atrial fibrillation: Secondary | ICD-10-CM

## 2024-02-19 DIAGNOSIS — I361 Nonrheumatic tricuspid (valve) insufficiency: Secondary | ICD-10-CM | POA: Diagnosis not present

## 2024-02-19 NOTE — Patient Instructions (Signed)
 Medication Instructions:  Your physician recommends that you continue on your current medications as directed. Please refer to the Current Medication list given to you today.   Stop Eliquis 2 days before your procedure (total of 4 doses). Restart minimal 12 hours after your procedure at the discretion of the physician preforming the procedure.   *If you need a refill on your cardiac medications before your next appointment, please call your pharmacy*  Follow-Up: At [facility], you and your health needs are our priority.  As part of our continuing mission to provide you with exceptional heart care, we have created designated Provider Care Teams.  These Care Teams include your primary Cardiologist (physician) and Advanced Practice Providers (APPs -  Physician Assistants and Nurse Practitioners) who all work together to provide you with the care you need, when you need it.   Your next appointment:   6 month(s)  Provider:   Thomasene Ripple, DO

## 2024-02-19 NOTE — Telephone Encounter (Signed)
 Dr Barron Alvine  Cardiologist wants pt to hold Eliquis 3 days. I asked for two. Is 3 days ok?

## 2024-02-19 NOTE — Telephone Encounter (Signed)
 Dr Carloyn Manner

## 2024-02-19 NOTE — Progress Notes (Unsigned)
 Cardiology Office Note:    Date:  02/23/2024   ID:  Kathy Thornton, DOB 1934/01/08, MRN [id number]  PCP:  Pearline Cables, MD  Cardiologist:  Thomasene Ripple, DO  Electrophysiologist:  None   Referring MD: Pearline Cables, MD   " I am doing well"   History of Present Illness:    Kathy Thornton is a 89 y.o. female with a hx of breast cancer status post lobectomy and radiation, paroxysmal atrial fibrillation on metoprolol as well as Eliquis, mild to moderate tricuspid regurgitation.  The patient is here for follow-up visit  Since her visit with with me she has been followed and worked up for concerns about potential gastrointestinal bleeding. She reports finding blood in her stool after a recent stool test. However, a subsequent test involving a finger check did not reveal any blood. The patient also mentions discomfort in the area where she previously had ulcers.  In addition to the gastrointestinal concerns, the patient also reports leg swelling, which is currently managed with furosemide. The patient mentions that the swelling has improved and she is regaining some feeling in the area. She also reports using an infrared light on her knees, which has helped significantly with pain and stiffness. The patient remains active, exercising in the mornings and maintaining her own house.    Past Medical History:  Diagnosis Date   Breast cancer (HCC) 2019   Left Breast Cancer   Breast cancer in female [facility])    Left   Sheria Lang lesion, acute: Per EGD 11/06/2015 11/06/2015   Cataract    Erosive gastritis: per EGD 11/05/2105 11/06/2015   Family history of breast cancer    Family history of colon cancer    Heart murmur    Hemorrhoids; Grade 1 per colonoscopy 11/06/2015 11/06/2015   Knee pain     Past Surgical History:  Procedure Laterality Date   BREAST LUMPECTOMY Left 03/30/2018   BREAST LUMPECTOMY WITH RADIOACTIVE SEED LOCALIZATION Left 03/30/2018   Procedure: RADIOACTIVE SEED GUIDED LEFT  BREAST LUMPECTOMY;  Surgeon: Claud Kelp, MD;  Location: [facility] OR;  Service: General;  Laterality: Left;   COLONOSCOPY WITH PROPOFOL N/A 11/06/2015   Procedure: COLONOSCOPY WITH PROPOFOL;  Surgeon: Meryl Dare, MD;  Location: [facility];  Service: Endoscopy;  Laterality: N/A;   ESOPHAGOGASTRODUODENOSCOPY (EGD) WITH PROPOFOL N/A 11/06/2015   Procedure: ESOPHAGOGASTRODUODENOSCOPY (EGD) WITH PROPOFOL;  Surgeon: Meryl Dare, MD;  Location: [facility];  Service: Endoscopy;  Laterality: N/A;   EYE SURGERY      Current Medications: Current Meds  Medication Sig   apixaban (ELIQUIS) 5 MG TABS tablet TAKE 1 TABLET BY MOUTH TWICE DAILY. KEEP APPOINTMENT   Ascorbic Acid (VITAMIN C) 500 MG CAPS Take 500 mg by mouth daily.   Cholecalciferol (VITAMIN D3 PO) Take by mouth daily.   Cinnamon 500 MG capsule Take 1 capsule (500 mg total) by mouth daily.   ferrous sulfate 325 (65 FE) MG tablet Take 1 tablet (325 mg total) by mouth daily with breakfast.   metoprolol succinate (TOPROL-XL) 25 MG 24 hr tablet TAKE 1 TABLET(25 MG) BY MOUTH DAILY   Multiple Vitamins-Minerals (WOMENS MULTIVITAMIN PO) Take by mouth.   pantoprazole (PROTONIX) 40 MG tablet TAKE 1 TABLET BY MOUTH DAILY   potassium chloride (KLOR-CON) 10 MEQ tablet TAKE 1 TABLET BY MOUTH DAILY AS NEEDED WHEN TAKING FUROSEMIDE   traMADol (ULTRAM) 50 MG tablet TAKE 1 TABLET BY MOUTH EVERY 8 HOURS AS NEEDED FOR MODERATE PAIN   Turmeric (  QC TUMERIC COMPLEX PO) Take by mouth.   vitamin B-12 (CYANOCOBALAMIN) 100 MCG tablet Take 100 mcg by mouth daily.     Allergies:   Sulfa antibiotics   Social History   Socioeconomic History   Marital status: Married    Spouse name: Not on file   Number of children: Not on file   Years of education: Not on file   Highest education level: Not on file  Occupational History   Not on file  Tobacco Use   Smoking status: Never   Smokeless tobacco: Never  Vaping Use   Vaping status: Never Used  Substance and  Sexual Activity   Alcohol use: No   Drug use: No   Sexual activity: Not on file  Other Topics Concern   Not on file  Social History Narrative   Not on file   Social Drivers of Health   Financial Resource Strain: Low Risk  (08/12/2022)   Overall Financial Resource Strain (CARDIA)    Difficulty of Paying Living Expenses: Not hard at all  Food Insecurity: No Food Insecurity (08/12/2022)   Hunger Vital Sign    Worried About Running Out of Food in the Last Year: Never true    Ran Out of Food in the Last Year: Never true  Transportation Needs: No Transportation Needs (08/12/2022)   PRAPARE - Administrator, Civil Service (Medical): No    Lack of Transportation (Non-Medical): No  Physical Activity: Inactive (08/12/2022)   Exercise Vital Sign    Days of Exercise per Week: 0 days    Minutes of Exercise per Session: 0 min  Stress: No Stress Concern Present (08/12/2022)   [employer] of Occupational Health - Occupational Stress Questionnaire    Feeling of Stress : Not at all  Social Connections: Moderately Integrated (08/12/2022)   Social Connection and Isolation Panel [NHANES]    Frequency of Communication with Friends and Family: More than three times a week    Frequency of Social Gatherings with Friends and Family: Twice a week    Attends Religious Services: 1 to 4 times per year    Active Member of [employer] or Organizations: No    Attends Banker Meetings: Never    Marital Status: Married     Family History: The patient's family history includes AAA (abdominal aortic aneurysm) in her maternal uncle; Aneurysm in her mother; Breast cancer in her cousin; Colon cancer in her paternal uncle; Heart disease in her brother and father.  ROS:   Review of Systems  Constitution: Negative for decreased appetite, fever and weight gain.  HENT: Negative for congestion, ear discharge, hoarse voice and sore throat.   Eyes: Negative for discharge, redness, vision loss in right eye  and visual halos.  Cardiovascular: Bilateral leg swelling.  Negative for chest pain, dyspnea on exertion, orthopnea and palpitations.  Respiratory: Negative for cough, hemoptysis, shortness of breath and snoring.   Endocrine: Negative for heat intolerance and polyphagia.  Hematologic/Lymphatic: Negative for bleeding problem. Does not bruise/bleed easily.  Skin: Negative for flushing, nail changes, rash and suspicious lesions.  Musculoskeletal: Negative for arthritis, joint pain, muscle cramps, myalgias, neck pain and stiffness.  Gastrointestinal: Negative for abdominal pain, bowel incontinence, diarrhea and excessive appetite.  Genitourinary: Negative for decreased libido, genital sores and incomplete emptying.  Neurological: Negative for brief paralysis, focal weakness, headaches and loss of balance.  Psychiatric/Behavioral: Negative for altered mental status, depression and suicidal ideas.  Allergic/Immunologic: Negative for HIV exposure and persistent infections.  EKGs/Labs/Other Studies Reviewed:    The following studies were reviewed today:   EKG: None today  Zio monitor Patch Wear Time:  3 days and 1 hours starting June 18, 2021. Indication: Paroxysmal atrial fibrillation.     Patient had a min HR of 48 bpm, max HR of 154 bpm, and avg HR of 63 bpm.    Predominant underlying rhythm was Sinus Rhythm. Bundle Branch Block-interventricular conduction delay was present.    1583 Supraventricular Tachycardia runs occurred, the run with the fastest interval lasting 5.1 secs with a max rate of 154 bpm, the longest lasting 32.8 secs with an avg rate of 115 bpm.    Premature atrial complexes were rare (<1.0%). Premature ventricular complexes were rare (<1.0%).   No patient triggered events or diary events reported.      Conclusion: Paroxysmal supraventricular tachycardia which is suspected to be atrial tachycardia with variable block.  Echocardiogram June 09, 2021 IMPRESSIONS      1. Left ventricular ejection fraction, by estimation, is 55 to 60%. The  left ventricle has normal function. The left ventricle has no regional  wall motion abnormalities. Left ventricular diastolic parameters are  indeterminate. Elevated left ventricular  end-diastolic pressure. The average left ventricular global longitudinal  strain is -21.2 %. The global longitudinal strain is normal.   2. Right ventricular systolic function is normal. The right ventricular  size is normal. There is mildly elevated pulmonary artery systolic  pressure. The estimated right ventricular systolic pressure is 40.2 mmHg.   3. The mitral valve is normal in structure. Mild mitral valve  regurgitation. No evidence of mitral stenosis.   4. Tricuspid valve regurgitation is mild to moderate.   5. The aortic valve is normal in structure. Aortic valve regurgitation is  not visualized. No aortic stenosis is present.   6. The inferior vena cava is normal in size with greater than 50%  respiratory variability, suggesting right atrial pressure of 3 mmHg.   FINDINGS   Left Ventricle: Left ventricular ejection fraction, by estimation, is 55  to 60%. The left ventricle has normal function. The left ventricle has no  regional wall motion abnormalities. The average left ventricular global  longitudinal strain is -21.2 %.  The global longitudinal strain is normal. The left ventricular internal  cavity size was normal in size. There is no left ventricular hypertrophy.  Left ventricular diastolic parameters are indeterminate. Elevated left  ventricular end-diastolic pressure.   Right Ventricle: The right ventricular size is normal. No increase in  right ventricular wall thickness. Right ventricular systolic function is  normal. There is mildly elevated pulmonary artery systolic pressure. The  tricuspid regurgitant velocity is 3.05   m/s, and with an assumed right atrial pressure of 3 mmHg, the estimated  right  ventricular systolic pressure is 40.2 mmHg.   Left Atrium: Left atrial size was normal in size.   Right Atrium: Right atrial size was normal in size.   Pericardium: There is no evidence of pericardial effusion.   Mitral Valve: The mitral valve is normal in structure. Mild mitral valve  regurgitation. No evidence of mitral valve stenosis.   Tricuspid Valve: The tricuspid valve is normal in structure. Tricuspid  valve regurgitation is mild to moderate. No evidence of tricuspid  stenosis.   Aortic Valve: The aortic valve is normal in structure. Aortic valve  regurgitation is not visualized. No aortic stenosis is present.   Pulmonic Valve: The pulmonic valve was normal in structure. Pulmonic valve  regurgitation is  trivial. No evidence of pulmonic stenosis.   Aorta: The aortic root is normal in size and structure.   Venous: The inferior vena cava is normal in size with greater than 50%  respiratory variability, suggesting right atrial pressure of 3 mmHg.   IAS/Shunts: No atrial level shunt detected by color flow Doppler.   Recent Labs: 07/21/2023: Pro B Natriuretic peptide (BNP) 196.0 10/20/2023: Magnesium 2.0 02/14/2024: ALT 6; BUN 17; Creatinine, Ser 1.14; Hemoglobin 9.8; Platelets 181.0; Potassium 4.4; Sodium 138  Recent Lipid Panel    Component Value Date/Time   CHOL 157 01/18/2023 1344   TRIG 66.0 01/18/2023 1344   HDL 65.20 01/18/2023 1344   CHOLHDL 2 01/18/2023 1344   VLDL 13.2 01/18/2023 1344   LDLCALC 79 01/18/2023 1344    Physical Exam:    VS:  BP (!) 128/58 (BP Location: Left Arm, Patient Position: Sitting, Cuff Size: Normal)   Pulse 61   Ht 4\' 9"  (1.448 m)   Wt 142 lb 6.4 oz (64.6 kg)   SpO2 99%   BMI 30.82 kg/m     Wt Readings from Last 3 Encounters:  02/19/24 142 lb 6.4 oz (64.6 kg)  02/14/24 142 lb (64.4 kg)  01/31/24 144 lb (65.3 kg)     GEN: Well nourished, well developed in no acute distress HEENT: Normal NECK: No JVD; No carotid  bruits LYMPHATICS: No lymphadenopathy CARDIAC: S1S2 noted,RRR, no murmurs, rubs, gallops RESPIRATORY:  Clear to auscultation without rales, wheezing or rhonchi  ABDOMEN: Soft, non-tender, non-distended, +bowel sounds, no guarding. EXTREMITIES: +2 bilateral lower extremity edema, No cyanosis, no clubbing MUSCULOSKELETAL:  No deformity  SKIN: Warm and dry NEUROLOGIC:  Alert and oriented x 3, non-focal PSYCHIATRIC:  Normal affect, good insight  ASSESSMENT:    1. PAF (paroxysmal atrial fibrillation) (HCC)   2. Nonrheumatic tricuspid valve regurgitation    PLAN:    Paroxysmal atrial fibrillation -On Eliquis with bleeding risk concerns. Discussed alternative treatments like Watchman if bleeding persists. - Hold Eliquis two days prior to the endoscopic procedure. - Consider Watchman procedure if bleeding persists and anticoagulation needs to be stopped.  Gastrointestinal bleeding Suspected due to ulcers and hematochezia. Scheduled for endoscopy to identify source. Informed consent obtained. - Hold Eliquis two days prior to the endoscopic procedure. - Resume Eliquis after the procedure based on findings. - Proceed with endoscopic procedure to investigate gastrointestinal bleeding. - Chronic anemia with hemoglobin at 9.8 g/dL, likely due to gastrointestinal bleeding. Requires monitoring. - Monitor hemoglobin levels closely.  Peripheral edema -On furosemide with stable kidney function. Legs less swollen, active lifestyle. Adjusting furosemide dosing. - Adjust furosemide to every other day. - Monitor for swelling and adjust furosemide dosing as needed. - Continue potassium supplementation every other day.  The patient is in agreement with the above plan. The patient left the office in stable condition.  The patient will follow up in  12 weeks  or sooner if needed.   Medication Adjustments/Labs and Tests Ordered: Current medicines are reviewed at length with the patient today.  Concerns  regarding medicines are outlined above.  Orders Placed This Encounter  Procedures   EKG 12-Lead   No orders of the defined types were placed in this encounter.   Patient Instructions  Medication Instructions:  Your physician recommends that you continue on your current medications as directed. Please refer to the Current Medication list given to you today.   Stop Eliquis 2 days before your procedure (total of 4 doses). Restart minimal 12 hours after  your procedure at the discretion of the physician preforming the procedure.   *If you need a refill on your cardiac medications before your next appointment, please call your pharmacy*  Follow-Up: At [facility], you and your health needs are our priority.  As part of our continuing mission to provide you with exceptional heart care, we have created designated Provider Care Teams.  These Care Teams include your primary Cardiologist (physician) and Advanced Practice Providers (APPs -  Physician Assistants and Nurse Practitioners) who all work together to provide you with the care you need, when you need it.   Your next appointment:   6 month(s)  Provider:   Thomasene Ripple, DO       Adopting a Healthy Lifestyle.  Know what a healthy weight is for you (roughly BMI <25) and aim to maintain this   Aim for 7+ servings of fruits and vegetables daily   65-80+ fluid ounces of water or unsweet tea for healthy kidneys   Limit to max 1 drink of alcohol per day; avoid smoking/tobacco   Limit animal fats in diet for cholesterol and heart health - choose grass fed whenever available   Avoid highly processed foods, and foods high in saturated/trans fats   Aim for low stress - take time to unwind and care for your mental health   Aim for 150 min of moderate intensity exercise weekly for heart health, and weights twice weekly for bone health   Aim for 7-9 hours of sleep daily   When it comes to diets, agreement about the perfect plan  isnt easy to find, even among the experts. Experts at the [facility] of [employer] developed an idea known as the Healthy Eating Plate. Just imagine a plate divided into logical, healthy portions.   The emphasis is on diet quality:   Load up on vegetables and fruits - one-half of your plate: Aim for color and variety, and remember that potatoes dont count.   Go for whole grains - one-quarter of your plate: Whole wheat, barley, wheat berries, quinoa, oats, brown rice, and foods made with them. If you want pasta, go with whole wheat pasta.   Protein power - one-quarter of your plate: Fish, chicken, beans, and nuts are all healthy, versatile protein sources. Limit red meat.   The diet, however, does go beyond the plate, offering a few other suggestions.   Use healthy plant oils, such as olive, canola, soy, corn, sunflower and peanut. Check the labels, and avoid partially hydrogenated oil, which have unhealthy trans fats.   If youre thirsty, drink water. Coffee and tea are good in moderation, but skip sugary drinks and limit milk and dairy products to one or two daily servings.   The type of carbohydrate in the diet is more important than the amount. Some sources of carbohydrates, such as vegetables, fruits, whole grains, and beans-are healthier than others.   Finally, stay active  Signed, Thomasene Ripple, DO  02/23/2024 7:27 PM    [facility]

## 2024-02-19 NOTE — Progress Notes (Signed)
 Agree with the assessment and plan as outlined by Quentin Mulling, PA-C. ? ?Keron Neenan, DO, FACG ? ?

## 2024-02-20 NOTE — Telephone Encounter (Signed)
 Called patient to inform her and she said at the office appointment yesterday they told her to hold it 2 days. So she will be holding Eliquis for 2 days

## 2024-03-06 ENCOUNTER — Other Ambulatory Visit: Payer: Self-pay | Admitting: Cardiology

## 2024-03-06 ENCOUNTER — Ambulatory Visit
Admission: RE | Admit: 2024-03-06 | Discharge: 2024-03-06 | Disposition: A | Discharge status: Home / Self Care | Source: Ambulatory Visit | Attending: Family Medicine | Admitting: Family Medicine

## 2024-03-06 DIAGNOSIS — Z1231 Encounter for screening mammogram for malignant neoplasm of breast: Secondary | ICD-10-CM | POA: Diagnosis not present

## 2024-03-06 DIAGNOSIS — I4891 Unspecified atrial fibrillation: Secondary | ICD-10-CM

## 2024-03-06 NOTE — Telephone Encounter (Signed)
 Prescription refill request for Eliquis received. Indication: Afib  Last office visit: 02/19/24 ([place])  Scr: 1.14 (02/14/24)  Age: 89 Weight: 64.6kg  Appropriate dose. Refill sent.

## 2024-03-11 ENCOUNTER — Other Ambulatory Visit: Payer: Self-pay | Admitting: Family Medicine

## 2024-03-11 DIAGNOSIS — R928 Other abnormal and inconclusive findings on diagnostic imaging of breast: Secondary | ICD-10-CM

## 2024-03-27 ENCOUNTER — Encounter: Payer: Self-pay | Admitting: Gastroenterology

## 2024-04-04 ENCOUNTER — Ambulatory Visit: Admitting: Gastroenterology

## 2024-04-04 ENCOUNTER — Encounter: Payer: Self-pay | Admitting: Gastroenterology

## 2024-04-04 VITALS — BP 111/52 | HR 65 | Temp 97.5°F | Resp 14 | Ht <= 58 in | Wt 142.0 lb

## 2024-04-04 DIAGNOSIS — D500 Iron deficiency anemia secondary to blood loss (chronic): Secondary | ICD-10-CM

## 2024-04-04 DIAGNOSIS — Q399 Congenital malformation of esophagus, unspecified: Secondary | ICD-10-CM

## 2024-04-04 DIAGNOSIS — K449 Diaphragmatic hernia without obstruction or gangrene: Secondary | ICD-10-CM

## 2024-04-04 DIAGNOSIS — E538 Deficiency of other specified B group vitamins: Secondary | ICD-10-CM

## 2024-04-04 MED ORDER — SODIUM CHLORIDE 0.9 % IV SOLN: 500.0000 mL | Freq: Once | INTRAVENOUS | Status: DC

## 2024-04-04 NOTE — Progress Notes (Signed)
 Sedate, gd SR, tolerated procedure well, VSS, report to RN

## 2024-04-04 NOTE — Op Note (Signed)
 [facility] Patient Name: Kathy Thornton Procedure Date: 04/04/2024 11:21 AM MRN: [id number] Endoscopist: Harry Lindau , MD, [id number] Age: 89 Referring MD:  Date of Birth: March 05, 1934 Gender: Female Account #: [id number] Procedure:                Upper GI endoscopy Indications:              Iron  deficiency anemia Medicines:                Monitored Anesthesia Care Procedure:                Pre-Anesthesia Assessment:                           - Prior to the procedure, a History and Physical                            was performed, and patient medications and                            allergies were reviewed. The patient's tolerance of                            previous anesthesia was also reviewed. The risks                            and benefits of the procedure and the sedation                            options and risks were discussed with the patient.                            All questions were answered, and informed consent                            was obtained. Prior Anticoagulants: The patient has                            taken Eliquis  (apixaban ), last dose was 2 days                            prior to procedure. ASA Grade Assessment: II - A                            patient with mild systemic disease. After reviewing                            the risks and benefits, the patient was deemed in                            satisfactory condition to undergo the procedure.                           After obtaining informed consent, the endoscope was  passed under direct vision. Throughout the                            procedure, the patient's blood pressure, pulse, and                            oxygen saturations were monitored continuously. The                            Olympus scope [id number] was introduced through the                            mouth, and advanced to the second part of duodenum.                            The  upper GI endoscopy was accomplished without                            difficulty. The patient tolerated the procedure                            well. Scope In: Scope Out: Findings:                 An 8 cm hiatal hernia was present. Multiple passes                            were made through the hernia and evaluated in both                            anterograde and retirflexed views; no active                            bleeding or stigmata of recent bleeding noted.                           The lower third of the esophagus was mildly                            tortuous.                           The upper third of the esophagus and middle third                            of the esophagus were normal.                           Normal mucosa was found in the entire examined                            stomach.                           The examined duodenum was normal. Complications:  No immediate complications. Estimated Blood Loss:     Estimated blood loss: none. Impression:               - 8 cm hiatal hernia.                           - Tortuous esophagus.                           - Normal upper third of esophagus and middle third                            of esophagus.                           - Normal mucosa was found in the entire stomach.                           - Normal examined duodenum.                           - No specimens collected. Recommendation:           - Patient has a contact number available for                            emergencies. The signs and symptoms of potential                            delayed complications were discussed with the                            patient. Return to normal activities tomorrow.                            Written discharge instructions were provided to the                            patient.                           - Resume previous diet.                           - Continue present medications.                            - Return to GI clinic PRN.                           - Resume Eliquis  today. Harry Lindau, MD 04/04/2024 11:39:18 AM

## 2024-04-04 NOTE — Patient Instructions (Addendum)
 Thank you for letting us  take care of your healthcare needs today. Please see handouts given to you on Hiatal Hernia. Resume Eliquis  today.    YOU HAD AN ENDOSCOPIC PROCEDURE TODAY AT THE [facility]:   Refer to the procedure report that was given to you for any specific questions about what was found during the examination.  If the procedure report does not answer your questions, please call your gastroenterologist to clarify.  If you requested that your care partner not be given the details of your procedure findings, then the procedure report has been included in a sealed envelope for you to review at your convenience later.  YOU SHOULD EXPECT: Some feelings of bloating in the abdomen. Passage of more gas than usual.  Walking can help get rid of the air that was put into your GI tract during the procedure and reduce the bloating. If you had a lower endoscopy (such as a colonoscopy or flexible sigmoidoscopy) you may notice spotting of blood in your stool or on the toilet paper. If you underwent a bowel prep for your procedure, you may not have a normal bowel movement for a few days.  Please Note:  You might notice some irritation and congestion in your nose or some drainage.  This is from the oxygen used during your procedure.  There is no need for concern and it should clear up in a day or so.  SYMPTOMS TO REPORT IMMEDIATELY:   Following upper endoscopy (EGD)  Vomiting of blood or coffee ground material  New chest pain or pain under the shoulder blades  Painful or persistently difficult swallowing  New shortness of breath  Fever of 100F or higher  Black, tarry-looking stools  For urgent or emergent issues, a gastroenterologist can be reached at any hour by calling [phone]. Do not use MyChart messaging for urgent concerns.    DIET:  We do recommend a small meal at first, but then you may proceed to your regular diet.  Drink plenty of fluids but you should avoid  alcoholic beverages for 24 hours.  ACTIVITY:  You should plan to take it easy for the rest of today and you should NOT DRIVE or use heavy machinery until tomorrow (because of the sedation medicines used during the test).    FOLLOW UP: Our staff will call the number listed on your records the next business day following your procedure.  We will call around 7:15- 8:00 am to check on you and address any questions or concerns that you may have regarding the information given to you following your procedure. If we do not reach you, we will leave a message.     If any biopsies were taken you will be contacted by phone or by letter within the next 1-3 weeks.  Please call us  at [phone] if you have not heard about the biopsies in 3 weeks.    SIGNATURES/CONFIDENTIALITY: You and/or your care partner have signed paperwork which will be entered into your electronic medical record.  These signatures attest to the fact that that the information above on your After Visit Summary has been reviewed and is understood.  Full responsibility of the confidentiality of this discharge information lies with you and/or your care-partner.

## 2024-04-04 NOTE — Progress Notes (Signed)
 GASTROENTEROLOGY PROCEDURE H&P NOTE   Primary Care Physician: Kaylee Partridge, MD    Reason for Procedure:  Iron  deficiency anemia, history of gastric ulcer, hiatal hernia, epigastric pain, RUQ pain, Cameron's ulcers  Plan:    EGD  Patient is appropriate for endoscopic procedure(s) in the ambulatory ([facility]) setting.  The nature of the procedure, as well as the risks, benefits, and alternatives were carefully and thoroughly reviewed with the patient. Ample time for discussion and questions allowed. The patient understood, was satisfied, and agreed to proceed.     HPI: Kathy Thornton is a 89 y.o. female who presents for EGD for evaluation of iron  deficiency anemia, epigastric pain, RUQ pain.  Does have a history of gastric ulcer, hiatal hernia with Cameron's ulcers in 2016.  Holding Eliquis  for procedure today.  Past Medical History:  Diagnosis Date   Breast cancer (HCC) 2019   Left Breast Cancer   Breast cancer in female [facility])    Left   Donelda Fujita lesion, acute: Per EGD 11/06/2015 11/06/2015   Cataract    Erosive gastritis: per EGD 11/05/2105 11/06/2015   Family history of breast cancer    Family history of colon cancer    Heart murmur    Hemorrhoids; Grade 1 per colonoscopy 11/06/2015 11/06/2015   Knee pain     Past Surgical History:  Procedure Laterality Date   BREAST LUMPECTOMY Left 03/30/2018   BREAST LUMPECTOMY WITH RADIOACTIVE SEED LOCALIZATION Left 03/30/2018   Procedure: RADIOACTIVE SEED GUIDED LEFT BREAST LUMPECTOMY;  Surgeon: Boyce Byes, MD;  Location: [facility] OR;  Service: General;  Laterality: Left;   COLONOSCOPY WITH PROPOFOL  N/A 11/06/2015   Procedure: COLONOSCOPY WITH PROPOFOL ;  Surgeon: Asencion Blacksmith, MD;  Location: [facility];  Service: Endoscopy;  Laterality: N/A;   ESOPHAGOGASTRODUODENOSCOPY (EGD) WITH PROPOFOL  N/A 11/06/2015   Procedure: ESOPHAGOGASTRODUODENOSCOPY (EGD) WITH PROPOFOL ;  Surgeon: Asencion Blacksmith, MD;  Location: [facility];  Service:  Endoscopy;  Laterality: N/A;   EYE SURGERY      Prior to Admission medications   Medication Sig Start Date End Date Taking? Authorizing Provider  Ascorbic Acid (VITAMIN C) 500 MG CAPS Take 500 mg by mouth daily.   Yes [provider]  Cholecalciferol (VITAMIN D3 PO) Take by mouth daily.   Yes [provider]  Cinnamon 500 MG capsule Take 1 capsule (500 mg total) by mouth daily. 06/11/18  Yes Magrinat, Rozella Cornfield, MD  ferrous sulfate  325 (65 FE) MG tablet Take 1 tablet (325 mg total) by mouth daily with breakfast. 02/15/24  Yes Edmonia Gottron, PA-C  furosemide  (LASIX ) 20 MG tablet  12/20/23  Yes [provider]  metoprolol  succinate (TOPROL -XL) 25 MG 24 hr tablet TAKE 1 TABLET(25 MG) BY MOUTH DAILY 01/18/24  Yes Copland, Jessica C, MD  Multiple Vitamins-Minerals (WOMENS MULTIVITAMIN PO) Take by mouth.   Yes [provider]  pantoprazole  (PROTONIX ) 40 MG tablet TAKE 1 TABLET BY MOUTH DAILY 01/18/24  Yes Copland, Jessica C, MD  potassium chloride  (KLOR-CON ) 10 MEQ tablet TAKE 1 TABLET BY MOUTH DAILY AS NEEDED WHEN TAKING FUROSEMIDE  11/23/23  Yes Copland, Skipper Dumas, MD  traMADol  (ULTRAM ) 50 MG tablet TAKE 1 TABLET BY MOUTH EVERY 8 HOURS AS NEEDED FOR MODERATE PAIN 12/07/23  Yes Copland, Skipper Dumas, MD  vitamin B-12 (CYANOCOBALAMIN ) 100 MCG tablet Take 100 mcg by mouth daily.   Yes [provider]  ELIQUIS  5 MG TABS tablet TAKE 1 TABLET(5 MG) BY MOUTH TWICE DAILY 03/06/24   Tobb,  Kardie, DO  furosemide  (LASIX ) 40 MG tablet Take 1 tablet (40 mg total) by mouth daily. 10/20/23 01/18/24  Tobb, Kardie, DO  Turmeric (QC TUMERIC COMPLEX PO) Take by mouth. Patient not taking: Reported on 04/04/2024    [provider]    Current Outpatient Medications  Medication Sig Dispense Refill   Ascorbic Acid (VITAMIN C) 500 MG CAPS Take 500 mg by mouth daily.     Cholecalciferol (VITAMIN D3 PO) Take by mouth daily.     Cinnamon 500 MG capsule Take 1 capsule (500 mg total)  by mouth daily.     ferrous sulfate  325 (65 FE) MG tablet Take 1 tablet (325 mg total) by mouth daily with breakfast.     furosemide  (LASIX ) 20 MG tablet      metoprolol  succinate (TOPROL -XL) 25 MG 24 hr tablet TAKE 1 TABLET(25 MG) BY MOUTH DAILY 90 tablet 3   Multiple Vitamins-Minerals (WOMENS MULTIVITAMIN PO) Take by mouth.     pantoprazole  (PROTONIX ) 40 MG tablet TAKE 1 TABLET BY MOUTH DAILY 90 tablet 1   potassium chloride  (KLOR-CON ) 10 MEQ tablet TAKE 1 TABLET BY MOUTH DAILY AS NEEDED WHEN TAKING FUROSEMIDE  90 tablet 0   traMADol  (ULTRAM ) 50 MG tablet TAKE 1 TABLET BY MOUTH EVERY 8 HOURS AS NEEDED FOR MODERATE PAIN 90 tablet 1   vitamin B-12 (CYANOCOBALAMIN ) 100 MCG tablet Take 100 mcg by mouth daily.     ELIQUIS  5 MG TABS tablet TAKE 1 TABLET(5 MG) BY MOUTH TWICE DAILY 60 tablet 5   furosemide  (LASIX ) 40 MG tablet Take 1 tablet (40 mg total) by mouth daily. 90 tablet 3   Turmeric (QC TUMERIC COMPLEX PO) Take by mouth. (Patient not taking: Reported on 04/04/2024)     Current Facility-Administered Medications  Medication Dose Route Frequency Provider Last Rate Last Admin   0.9 %  sodium chloride  infusion  500 mL Intravenous Once Dael Howland V, DO        Allergies as of 04/04/2024 - Review Complete 04/04/2024  Allergen Reaction Noted   Sulfa antibiotics Hives 06/09/2012    Family History  Problem Relation Age of Onset   Aneurysm Mother        brain   Heart disease Father    Heart disease Brother    AAA (abdominal aortic aneurysm) Maternal Uncle    Colon cancer Paternal Uncle        dx >50   Breast cancer Cousin        pat cousin, dx in her late 21s    Social History   Socioeconomic History   Marital status: Married    Spouse name: Not on file   Number of children: Not on file   Years of education: Not on file   Highest education level: Not on file  Occupational History   Not on file  Tobacco Use   Smoking status: Never   Smokeless tobacco: Never  Vaping Use    Vaping status: Never Used  Substance and Sexual Activity   Alcohol use: No   Drug use: No   Sexual activity: Not on file  Other Topics Concern   Not on file  Social History Narrative   Not on file   Social Drivers of Health   Financial Resource Strain: Low Risk  (08/12/2022)   Overall Financial Resource Strain (CARDIA)    Difficulty of Paying Living Expenses: Not hard at all  Food Insecurity: No Food Insecurity (08/12/2022)   Hunger Vital Sign    Worried  About Running Out of Food in the Last Year: Never true    Ran Out of Food in the Last Year: Never true  Transportation Needs: No Transportation Needs (08/12/2022)   PRAPARE - Administrator, Civil Service (Medical): No    Lack of Transportation (Non-Medical): No  Physical Activity: Inactive (08/12/2022)   Exercise Vital Sign    Days of Exercise per Week: 0 days    Minutes of Exercise per Session: 0 min  Stress: No Stress Concern Present (08/12/2022)   [employer] of Occupational Health - Occupational Stress Questionnaire    Feeling of Stress : Not at all  Social Connections: Moderately Integrated (08/12/2022)   Social Connection and Isolation Panel [NHANES]    Frequency of Communication with Friends and Family: More than three times a week    Frequency of Social Gatherings with Friends and Family: Twice a week    Attends Religious Services: 1 to 4 times per year    Active Member of [employer] or Organizations: No    Attends Banker Meetings: Never    Marital Status: Married  Catering manager Violence: Not At Risk (08/12/2022)   Humiliation, Afraid, Rape, and Kick questionnaire    Fear of Current or Ex-Partner: No    Emotionally Abused: No    Physically Abused: No    Sexually Abused: No    Physical Exam: Vital signs in last 24 hours: @BP  (!) 150/93   Pulse (!) 56   Temp (!) 97.5 F (36.4 C) (Temporal)   Ht 4\' 9"  (1.448 m)   Wt 142 lb (64.4 kg)   SpO2 98%   BMI 30.73 kg/m  GEN: NAD EYE: Sclerae  anicteric ENT: MMM CV: Non-tachycardic Pulm: CTA b/l GI: Soft, NT/ND NEURO:  Alert & Oriented x 3   Harry Lindau, DO [facility]   04/04/2024 11:19 AM

## 2024-04-05 ENCOUNTER — Telehealth: Payer: Self-pay | Admitting: *Deleted

## 2024-04-05 NOTE — Telephone Encounter (Signed)
  Follow up Call-     04/04/2024   10:34 AM  Call back number  Post procedure Call Back phone  # [phone]  Permission to leave phone message Yes     Patient questions:  Do you have a fever, pain , or abdominal swelling? No. Pain Score  0 *  Have you tolerated food without any problems? Yes.    Have you been able to return to your normal activities? Yes.    Do you have any questions about your discharge instructions: Diet   No. Medications  No. Follow up visit  No.  Do you have questions or concerns about your Care? No.  Actions: * If pain score is 4 or above: No action needed, pain <4.

## 2024-04-17 ENCOUNTER — Other Ambulatory Visit: Payer: Self-pay | Admitting: Family Medicine

## 2024-04-17 DIAGNOSIS — M170 Bilateral primary osteoarthritis of knee: Secondary | ICD-10-CM

## 2024-06-05 ENCOUNTER — Telehealth: Payer: Self-pay | Admitting: Family Medicine

## 2024-06-05 NOTE — Telephone Encounter (Signed)
 Copied from CRM [id number]. Topic: Medicare AWV >> Jun 05, 2024  9:32 AM Nathanel DEL wrote: Reason for CRM: LVM 06/05/2024 to schedule AWV. Please schedule Virtual or Telehealth visits ONLY.   Nathanel Paschal; Care Guide Ambulatory Clinical Support [place] [facility] Direct Dial: [phone]

## 2024-06-12 ENCOUNTER — Encounter: Payer: Self-pay | Admitting: Family Medicine

## 2024-07-17 ENCOUNTER — Other Ambulatory Visit: Payer: Self-pay | Admitting: Family Medicine

## 2024-07-17 DIAGNOSIS — Z8711 Personal history of peptic ulcer disease: Secondary | ICD-10-CM

## 2024-07-24 ENCOUNTER — Other Ambulatory Visit: Payer: Self-pay | Admitting: Family Medicine

## 2024-07-24 DIAGNOSIS — M170 Bilateral primary osteoarthritis of knee: Secondary | ICD-10-CM

## 2024-07-28 NOTE — Patient Instructions (Incomplete)
 It was great to see you today Please be sure to get a flu shot and I also recommend a COVID booster this fall I would also recommend getting 1 dose of RSV vaccine, and the shingles vaccine series   We will get an ultrasound to check on your gallbladder   Assuming all is well please see me in about 6 months

## 2024-07-28 NOTE — Progress Notes (Addendum)
 [facility] [address] [place], [place] [address] [phone]  Date:  07/31/2024   Name:  Kathy Thornton   DOB:  1934-01-14   MRN:  [id number]  PCP:  Watt Harlene BROCKS, MD    Chief Complaint: Medical Management of Chronic Issues   History of Present Illness:  Kathy Thornton is a 89 y.o. very pleasant female patient who presents with the following:  Patient seen today for periodic up.  Most recent visit with myself was in February History of breast cancer 2018, chronic arthritis pain, osteoporosis, chronic renal insufficiency, atrial fibrillation. She had an ulcer in 2018 or 2019 due to NSAID use, we now use tramadol  for her knee pain-  She uses furosemide  for lower extremity edema which is helpful  Lives with her husband, daughters [name] and Santana are involved in their care When I saw her in the spring we found that she had subacute GI bleed with a drop in her hemoglobin and positive FOBT She saw gastroenterology who planned an upper GI  She saw her cardiologist, Dr.Tobb in March prior to procedure follow-up and procedure clearance: Paroxysmal atrial fibrillation -On Eliquis  with bleeding risk concerns. Discussed alternative treatments like Watchman if bleeding persists. - Hold Eliquis  two days prior to the endoscopic procedure. - Consider Watchman procedure if bleeding persists and anticoagulation needs to be stopped. Gastrointestinal bleeding Suspected due to ulcers and hematochezia. Scheduled for endoscopy to identify source. Informed consent obtained. - Hold Eliquis  two days prior to the endoscopic procedure. - Resume Eliquis  after the procedure based on findings. - Proceed with endoscopic procedure to investigate gastrointestinal bleeding. - Chronic anemia with hemoglobin at 9.8 g/dL, likely due to gastrointestinal bleeding. Requires monitoring. - Monitor hemoglobin levels closely. Peripheral edema -On  furosemide  with stable kidney function. Legs less swollen, active lifestyle. Adjusting furosemide  dosing. - Adjust furosemide  to every other day. - Monitor for swelling and adjust furosemide  dosing as needed. - Continue potassium supplementation every other day.  Dr. San did an EGD in May-they did not see any source of bleeding, but did identify a hiatal hernia She notes her stomach will hurt after eating - she indicates her RUQ It will hurt more with certain foods- rice, corn are examples she gives She is not having vomiting or diarrhea She notes am nausea at times No fevers  She is eating a bit less due to her sx and has lost a few lbs- she is also on lasix  for fluid retention  She will occasionally notice some a fib sx but not often   We need to follow-up on her blood counts today  Eliquis  5 twice daily Iron  supplementation by mouth Furosemide  40 once daily/potassium 10 mEq daily Toprol  XL 25 Tramadol  B12 by mouth  She had an abnormal screening mammogram in April, follow-up may have been delayed by GI bleed situation  She notes both her sisters had cholecystectomy   Wt Readings from Last 3 Encounters:  07/31/24 137 lb (62.1 kg)  04/04/24 142 lb (64.4 kg)  02/19/24 142 lb 6.4 oz (64.6 kg)      Patient Active Problem List   Diagnosis Date Noted   Knee pain 05/17/2021   Cataract 05/17/2021   Genetic testing 03/27/2018   Family history of colon cancer    Family history of breast cancer    Malignant neoplasm of upper-inner quadrant of left breast in female, estrogen receptor positive (HCC) 01/02/2018  Osteoporosis 01/02/2018   Cameron lesion, acute: Per EGD 11/06/2015 11/06/2015   Erosive gastritis: per EGD 11/05/2105 11/06/2015   Hemorrhoids; Grade 1 per colonoscopy 11/06/2015 11/06/2015   Anemia, iron  deficiency    Acute gastric ulcer    Anemia 11/05/2015   Symptomatic anemia 11/05/2015   Heart murmur 11/05/2015   Lower urinary tract infectious disease  11/05/2015   Lower extremity edema 11/05/2015   Arthritis of both knees 11/04/2015    Past Medical History:  Diagnosis Date   Breast cancer (HCC) 2019   Left Breast Cancer   Breast cancer in female [facility])    Left   Cameron lesion, acute: Per EGD 11/06/2015 11/06/2015   Cataract    Erosive gastritis: per EGD 11/05/2105 11/06/2015   Family history of breast cancer    Family history of colon cancer    Heart murmur    Hemorrhoids; Grade 1 per colonoscopy 11/06/2015 11/06/2015   Knee pain     Past Surgical History:  Procedure Laterality Date   BREAST LUMPECTOMY Left 03/30/2018   BREAST LUMPECTOMY WITH RADIOACTIVE SEED LOCALIZATION Left 03/30/2018   Procedure: RADIOACTIVE SEED GUIDED LEFT BREAST LUMPECTOMY;  Surgeon: Gail Favorite, MD;  Location: [facility] OR;  Service: General;  Laterality: Left;   COLONOSCOPY WITH PROPOFOL  N/A 11/06/2015   Procedure: COLONOSCOPY WITH PROPOFOL ;  Surgeon: Gwendlyn ONEIDA Buddy, MD;  Location: [facility];  Service: Endoscopy;  Laterality: N/A;   ESOPHAGOGASTRODUODENOSCOPY (EGD) WITH PROPOFOL  N/A 11/06/2015   Procedure: ESOPHAGOGASTRODUODENOSCOPY (EGD) WITH PROPOFOL ;  Surgeon: Gwendlyn ONEIDA Buddy, MD;  Location: [facility];  Service: Endoscopy;  Laterality: N/A;   EYE SURGERY      Social History   Tobacco Use   Smoking status: Never   Smokeless tobacco: Never  Vaping Use   Vaping status: Never Used  Substance Use Topics   Alcohol use: No   Drug use: No    Family History  Problem Relation Age of Onset   Aneurysm Mother        brain   Heart disease Father    Heart disease Brother    AAA (abdominal aortic aneurysm) Maternal Uncle    Colon cancer Paternal Uncle        dx >50   Breast cancer Cousin        pat cousin, dx in her late 24s    Allergies  Allergen Reactions   Sulfa Antibiotics Hives    Medication list has been reviewed and updated.  Current Outpatient Medications on File Prior to Visit  Medication Sig Dispense Refill   Ascorbic Acid  (VITAMIN C) 500 MG CAPS Take 500 mg by mouth daily.     Cholecalciferol (VITAMIN D3 PO) Take by mouth daily.     Cinnamon 500 MG capsule Take 1 capsule (500 mg total) by mouth daily.     ELIQUIS  5 MG TABS tablet TAKE 1 TABLET(5 MG) BY MOUTH TWICE DAILY 60 tablet 5   ferrous sulfate  325 (65 FE) MG tablet Take 1 tablet (325 mg total) by mouth daily with breakfast.     furosemide  (LASIX ) 20 MG tablet      furosemide  (LASIX ) 40 MG tablet Take 1 tablet (40 mg total) by mouth daily. 90 tablet 3   metoprolol  succinate (TOPROL -XL) 25 MG 24 hr tablet TAKE 1 TABLET(25 MG) BY MOUTH DAILY 90 tablet 3   Multiple Vitamins-Minerals (WOMENS MULTIVITAMIN PO) Take by mouth.     pantoprazole  (PROTONIX ) 40 MG tablet TAKE 1 TABLET BY MOUTH DAILY 90 tablet 1  potassium chloride  (KLOR-CON ) 10 MEQ tablet TAKE 1 TABLET BY MOUTH DAILY AS NEEDED WHEN TAKING FUROSEMIDE  90 tablet 0   traMADol  (ULTRAM ) 50 MG tablet TAKE 1 TABLET BY MOUTH EVERY 8 HOURS AS NEEDED FOR MODERATE PAIN 90 tablet 1   Turmeric (QC TUMERIC COMPLEX PO) Take by mouth.     vitamin B-12 (CYANOCOBALAMIN ) 100 MCG tablet Take 100 mcg by mouth daily.     No current facility-administered medications on file prior to visit.    Review of Systems:  As per HPI- otherwise negative.   Physical Examination: Vitals:   07/31/24 1325 07/31/24 1345  BP: (!) 156/72 (!) 142/75  Pulse: 65   Temp: 97.7 F (36.5 C)   SpO2: 95%    Vitals:   07/31/24 1325  Weight: 137 lb (62.1 kg)  Height: 4' 9 (1.448 m)   Body mass index is 29.65 kg/m. Ideal Body Weight: Weight in (lb) to have BMI = 25: 115.3  GEN: no acute distress.  Mild overweight, looks well  HEENT: Atraumatic, Normocephalic.  Ears and Nose: No external deformity. CV: RRR, No M/G/R. No JVD. No thrill. No extra heart sounds. PULM: CTA B, no wheezes, crackles, rhonchi. No retractions. No resp. distress. No accessory muscle use. ABD: S, NT, ND, +BS. No rebound. No HSM.  Belly is benign at this time   EXTR: No c/c/e PSYCH: Normally interactive. Conversant.   BP Readings from Last 3 Encounters:  07/31/24 (!) 142/75  04/04/24 (!) 111/52  02/19/24 (!) 128/58    Assessment and Plan: Primary osteoarthritis of both knees  History of gastric ulcer - Plan: CBC, Ferritin  Malignant neoplasm of upper-inner quadrant of left breast in female, estrogen receptor positive (HCC)  Atrial fibrillation, unspecified type (HCC)  Screening for diabetes mellitus - Plan: Basic metabolic panel with GFR  RUQ abdominal mass - Plan: US  Abdomen Limited RUQ (LIVER/GB) Pt notes chronic knee pain, tramadol  helps,  she uses a cane Discussed her mammo- she does not want further imaging now, reasonable given her age A fib on anticoagulation Set up RUQ us  for possible gallbladder colic   Signed Harlene Schroeder, MD  Received her labs and called [name] to go over results  Results for orders placed or performed in visit on 07/31/24  CBC   Collection Time: 07/31/24  2:02 PM  Result Value Ref Range   WBC 5.8 4.0 - 10.5 K/uL   RBC 3.71 (L) 3.87 - 5.11 Mil/uL   Platelets 178.0 150.0 - 400.0 K/uL   Hemoglobin 11.8 (L) 12.0 - 15.0 g/dL   HCT 64.1 (L) 63.9 - 53.9 %   MCV 96.4 78.0 - 100.0 fl   MCHC 33.0 30.0 - 36.0 g/dL   RDW 84.7 88.4 - 84.4 %  Ferritin   Collection Time: 07/31/24  2:02 PM  Result Value Ref Range   Ferritin 51.1 10.0 - 291.0 ng/mL  Basic metabolic panel with GFR   Collection Time: 07/31/24  2:02 PM  Result Value Ref Range   Sodium 137 135 - 145 mEq/L   Potassium 4.8 3.5 - 5.1 mEq/L   Chloride 97 96 - 112 mEq/L   CO2 27 19 - 32 mEq/L   Glucose, Bld 96 70 - 99 mg/dL   BUN 19 6 - 23 mg/dL   Creatinine, Ser 8.74 (H) 0.40 - 1.20 mg/dL   GFR 61.50 (L) >39.99 mL/min   Calcium 9.2 8.4 - 10.5 mg/dL  Hg continues to trend up- can drop iron  to every other day  Kidney function stable  Will continue to monitor  Lasix  as needed only  Recheck in 6 months

## 2024-07-31 ENCOUNTER — Ambulatory Visit (INDEPENDENT_AMBULATORY_CARE_PROVIDER_SITE_OTHER): Payer: Medicare Other | Admitting: Family Medicine

## 2024-07-31 ENCOUNTER — Encounter: Payer: Self-pay | Admitting: Family Medicine

## 2024-07-31 VITALS — BP 142/75 | HR 65 | Temp 97.7°F | Ht <= 58 in | Wt 137.0 lb

## 2024-07-31 DIAGNOSIS — C50212 Malignant neoplasm of upper-inner quadrant of left female breast: Secondary | ICD-10-CM | POA: Diagnosis not present

## 2024-07-31 DIAGNOSIS — M170 Bilateral primary osteoarthritis of knee: Secondary | ICD-10-CM

## 2024-07-31 DIAGNOSIS — I4891 Unspecified atrial fibrillation: Secondary | ICD-10-CM

## 2024-07-31 DIAGNOSIS — Z8711 Personal history of peptic ulcer disease: Secondary | ICD-10-CM | POA: Diagnosis not present

## 2024-07-31 DIAGNOSIS — Z170 Estrogen receptor positive status [ER+]: Secondary | ICD-10-CM | POA: Diagnosis not present

## 2024-07-31 DIAGNOSIS — Z131 Encounter for screening for diabetes mellitus: Secondary | ICD-10-CM | POA: Diagnosis not present

## 2024-07-31 DIAGNOSIS — R1901 Right upper quadrant abdominal swelling, mass and lump: Secondary | ICD-10-CM

## 2024-08-01 LAB — BASIC METABOLIC PANEL WITH GFR
BUN: 19 mg/dL (ref 6–23)
CO2: 27 meq/L (ref 19–32)
Calcium: 9.2 mg/dL (ref 8.4–10.5)
Chloride: 97 meq/L (ref 96–112)
Creatinine, Ser: 1.25 mg/dL — ABNORMAL HIGH (ref 0.40–1.20)
GFR: 38.49 mL/min — ABNORMAL LOW (ref >60.00)
Glucose, Bld: 96 mg/dL (ref 70–99)
Potassium: 4.8 meq/L (ref 3.5–5.1)
Sodium: 137 meq/L (ref 135–145)

## 2024-08-01 LAB — CBC
HCT: 35.8 % — ABNORMAL LOW (ref 36.0–46.0)
Hemoglobin: 11.8 g/dL — ABNORMAL LOW (ref 12.0–15.0)
MCHC: 33 g/dL (ref 30.0–36.0)
MCV: 96.4 fl (ref 78.0–100.0)
Platelets: 178 K/uL (ref 150.0–400.0)
RBC: 3.71 Mil/uL — ABNORMAL LOW (ref 3.87–5.11)
RDW: 15.2 % (ref 11.5–15.5)
WBC: 5.8 K/uL (ref 4.0–10.5)

## 2024-08-01 LAB — FERRITIN: Ferritin: 51.1 ng/mL (ref 10.0–291.0)

## 2024-08-21 ENCOUNTER — Other Ambulatory Visit: Payer: Self-pay | Admitting: Family Medicine

## 2024-08-21 ENCOUNTER — Ambulatory Visit (HOSPITAL_BASED_OUTPATIENT_CLINIC_OR_DEPARTMENT_OTHER)
Admission: RE | Admit: 2024-08-21 | Discharge: 2024-08-21 | Disposition: A | Discharge status: Home / Self Care | Source: Ambulatory Visit | Attending: Family Medicine | Admitting: Family Medicine

## 2024-08-21 DIAGNOSIS — Z8711 Personal history of peptic ulcer disease: Secondary | ICD-10-CM

## 2024-08-21 DIAGNOSIS — R1901 Right upper quadrant abdominal swelling, mass and lump: Secondary | ICD-10-CM | POA: Diagnosis not present

## 2024-08-21 DIAGNOSIS — I4891 Unspecified atrial fibrillation: Secondary | ICD-10-CM

## 2024-08-21 DIAGNOSIS — R1011 Right upper quadrant pain: Secondary | ICD-10-CM | POA: Insufficient documentation

## 2024-08-21 DIAGNOSIS — K8020 Calculus of gallbladder without cholecystitis without obstruction: Secondary | ICD-10-CM | POA: Diagnosis not present

## 2024-08-21 DIAGNOSIS — Z170 Estrogen receptor positive status [ER+]: Secondary | ICD-10-CM

## 2024-08-21 DIAGNOSIS — Z131 Encounter for screening for diabetes mellitus: Secondary | ICD-10-CM

## 2024-08-21 DIAGNOSIS — M170 Bilateral primary osteoarthritis of knee: Secondary | ICD-10-CM

## 2024-08-23 ENCOUNTER — Telehealth: Payer: Self-pay

## 2024-08-23 ENCOUNTER — Ambulatory Visit: Payer: Self-pay

## 2024-08-23 ENCOUNTER — Emergency Department (HOSPITAL_COMMUNITY)

## 2024-08-23 ENCOUNTER — Encounter (HOSPITAL_COMMUNITY): Payer: Self-pay

## 2024-08-23 ENCOUNTER — Other Ambulatory Visit: Payer: Self-pay

## 2024-08-23 ENCOUNTER — Emergency Department (HOSPITAL_COMMUNITY)
Admission: EM | Admit: 2024-08-23 | Discharge: 2024-08-24 | Disposition: A | Discharge status: Home / Self Care | Attending: Emergency Medicine | Admitting: Emergency Medicine

## 2024-08-23 DIAGNOSIS — R911 Solitary pulmonary nodule: Secondary | ICD-10-CM | POA: Diagnosis not present

## 2024-08-23 DIAGNOSIS — D7389 Other diseases of spleen: Secondary | ICD-10-CM | POA: Diagnosis not present

## 2024-08-23 DIAGNOSIS — K8020 Calculus of gallbladder without cholecystitis without obstruction: Secondary | ICD-10-CM | POA: Diagnosis not present

## 2024-08-23 DIAGNOSIS — K769 Liver disease, unspecified: Secondary | ICD-10-CM | POA: Diagnosis not present

## 2024-08-23 DIAGNOSIS — R918 Other nonspecific abnormal finding of lung field: Secondary | ICD-10-CM | POA: Diagnosis not present

## 2024-08-23 DIAGNOSIS — K869 Disease of pancreas, unspecified: Secondary | ICD-10-CM | POA: Diagnosis not present

## 2024-08-23 DIAGNOSIS — Z853 Personal history of malignant neoplasm of breast: Secondary | ICD-10-CM | POA: Insufficient documentation

## 2024-08-23 DIAGNOSIS — K8689 Other specified diseases of pancreas: Secondary | ICD-10-CM | POA: Diagnosis not present

## 2024-08-23 DIAGNOSIS — Z7901 Long term (current) use of anticoagulants: Secondary | ICD-10-CM | POA: Diagnosis not present

## 2024-08-23 DIAGNOSIS — C787 Secondary malignant neoplasm of liver and intrahepatic bile duct: Secondary | ICD-10-CM | POA: Diagnosis not present

## 2024-08-23 DIAGNOSIS — R109 Unspecified abdominal pain: Secondary | ICD-10-CM | POA: Diagnosis present

## 2024-08-23 LAB — COMPREHENSIVE METABOLIC PANEL WITH GFR
ALT: 8 U/L (ref 0–44)
AST: 17 U/L (ref 15–41)
Albumin: 4.2 g/dL (ref 3.5–5.0)
Alkaline Phosphatase: 81 U/L (ref 38–126)
Anion gap: 12 (ref 5–15)
BUN: 16 mg/dL (ref 8–23)
CO2: 23 mmol/L (ref 22–32)
Calcium: 9.6 mg/dL (ref 8.9–10.3)
Chloride: 104 mmol/L (ref 98–111)
Creatinine, Ser: 1 mg/dL (ref 0.44–1.00)
GFR, Estimated: 54 mL/min — ABNORMAL LOW (ref >60)
Glucose, Bld: 103 mg/dL — ABNORMAL HIGH (ref 70–99)
Potassium: 4.4 mmol/L (ref 3.5–5.1)
Sodium: 139 mmol/L (ref 135–145)
Total Bilirubin: 0.4 mg/dL (ref 0.0–1.2)
Total Protein: 7.1 g/dL (ref 6.5–8.1)

## 2024-08-23 LAB — CBC
HCT: 36.1 % (ref 36.0–46.0)
Hemoglobin: 11.4 g/dL — ABNORMAL LOW (ref 12.0–15.0)
MCH: 31.5 pg (ref 26.0–34.0)
MCHC: 31.6 g/dL (ref 30.0–36.0)
MCV: 99.7 fL (ref 80.0–100.0)
Platelets: 169 K/uL (ref 150–400)
RBC: 3.62 MIL/uL — ABNORMAL LOW (ref 3.87–5.11)
RDW: 14.6 % (ref 11.5–15.5)
WBC: 6.1 K/uL (ref 4.0–10.5)
nRBC: 0 % (ref 0.0–0.2)

## 2024-08-23 LAB — LIPASE, BLOOD: Lipase: 26 U/L (ref 11–51)

## 2024-08-23 MED ORDER — IOHEXOL 300 MG/ML  SOLN
100.0000 mL | Freq: Once | INTRAMUSCULAR | Status: AC | PRN
  Administered 2024-08-23: 100 mL via INTRAVENOUS

## 2024-08-23 NOTE — ED Provider Notes (Signed)
 [place] EMERGENCY DEPARTMENT AT [facility] Provider Note  CSN: [id number] Arrival date & time: 08/23/24 1737  Chief Complaint(s) Abdominal Pain and Nausea  HPI Kathy Thornton is a 89 y.o. female with past medical history as below, significant for breast cancer follows with Dr. Loretha in remission, hemorrhoids, anemia, cataracts who presents to the ED with complaint of dental pain for months  Patient reports diffuse abdominal pain over the past 4 months.  Has also been losing weight, pain worsened when she eats.  Pain is sharp and stabbing, burning at times.  Feels as though the pain encompasses most of her abdomen.  She has been having worsening fatigue, body aches.  No fevers or chills.  No blood in emesis or blood in stool.  Having some nausea but no vomiting.  She has tried antacid medication which helped somewhat.  Took tramadol  and Tylenol  which also provide some relief.  She had recent right upper quadrant ultrasound but has not yet received results.  Past Medical History Past Medical History:  Diagnosis Date   Breast cancer (HCC) 2019   Left Breast Cancer   Breast cancer in female [facility])    Left   Cameron lesion, acute: Per EGD 11/06/2015 11/06/2015   Cataract    Erosive gastritis: per EGD 11/05/2105 11/06/2015   Family history of breast cancer    Family history of colon cancer    Heart murmur    Hemorrhoids; Grade 1 per colonoscopy 11/06/2015 11/06/2015   Knee pain    Patient Active Problem List   Diagnosis Date Noted   Knee pain 05/17/2021   Cataract 05/17/2021   Genetic testing 03/27/2018   Family history of colon cancer    Family history of breast cancer    Malignant neoplasm of upper-inner quadrant of left breast in female, estrogen receptor positive (HCC) 01/02/2018   Osteoporosis 01/02/2018   Cameron lesion, acute: Per EGD 11/06/2015 11/06/2015   Erosive gastritis: per EGD 11/05/2105 11/06/2015   Hemorrhoids; Grade 1 per colonoscopy 11/06/2015 11/06/2015    Anemia, iron  deficiency    Acute gastric ulcer    Anemia 11/05/2015   Symptomatic anemia 11/05/2015   Heart murmur 11/05/2015   Lower urinary tract infectious disease 11/05/2015   Lower extremity edema 11/05/2015   Arthritis of both knees 11/04/2015   Home Medication(s) Prior to Admission medications   Medication Sig Start Date End Date Taking? Authorizing Provider  acetaminophen  (TYLENOL ) 325 MG tablet Take 2 tablets (650 mg total) by mouth every 6 (six) hours as needed. 08/24/24  Yes Elnor Savant A, DO  ondansetron  (ZOFRAN ) 4 MG tablet Take 1 tablet (4 mg total) by mouth every 4 (four) hours as needed for nausea or vomiting. 08/24/24  Yes Elnor Savant A, DO  oxyCODONE  (ROXICODONE ) 5 MG immediate release tablet Take 1 tablet (5 mg total) by mouth every 6 (six) hours as needed for severe pain (pain score 7-10) or breakthrough pain. 08/24/24  Yes Elnor Savant A, DO  Ascorbic Acid (VITAMIN C) 500 MG CAPS Take 500 mg by mouth daily.    [provider]  Cholecalciferol (VITAMIN D3 PO) Take by mouth daily.    [provider]  Cinnamon 500 MG capsule Take 1 capsule (500 mg total) by mouth daily. 06/11/18   Magrinat, Sandria BROCKS, MD  ELIQUIS  5 MG TABS tablet TAKE 1 TABLET(5 MG) BY MOUTH TWICE DAILY 03/06/24   Tobb, Kardie, DO  ferrous sulfate  325 (65 FE) MG tablet Take 1 tablet (325 mg  total) by mouth daily with breakfast. 02/15/24   Craig Alan SAUNDERS, PA-C  furosemide  (LASIX ) 20 MG tablet  12/20/23   [provider]  furosemide  (LASIX ) 40 MG tablet Take 1 tablet (40 mg total) by mouth daily. 10/20/23 07/31/24  Tobb, Kardie, DO  metoprolol  succinate (TOPROL -XL) 25 MG 24 hr tablet TAKE 1 TABLET(25 MG) BY MOUTH DAILY 01/18/24   Copland, Harlene BROCKS, MD  Multiple Vitamins-Minerals (WOMENS MULTIVITAMIN PO) Take by mouth.    [provider]  pantoprazole  (PROTONIX ) 40 MG tablet TAKE 1 TABLET BY MOUTH DAILY 07/17/24   Copland, Jessica C, MD  potassium chloride  (KLOR-CON ) 10 MEQ tablet  TAKE 1 TABLET BY MOUTH DAILY AS NEEDED WHEN TAKING FUROSEMIDE  11/23/23   Copland, Harlene BROCKS, MD  sucralfate  (CARAFATE ) 1 g tablet Take 1 tablet (1 g total) by mouth with breakfast, with lunch, and with evening meal. 08/26/24   Copland, Harlene BROCKS, MD  traMADol  (ULTRAM ) 50 MG tablet TAKE 1 TABLET BY MOUTH EVERY 8 HOURS AS NEEDED FOR MODERATE PAIN 07/24/24   Copland, Jessica C, MD  Turmeric (QC TUMERIC COMPLEX PO) Take by mouth.    [provider]  vitamin B-12 (CYANOCOBALAMIN ) 100 MCG tablet Take 100 mcg by mouth daily.    [provider]                                                                                                                                    Past Surgical History Past Surgical History:  Procedure Laterality Date   BREAST LUMPECTOMY Left 03/30/2018   BREAST LUMPECTOMY WITH RADIOACTIVE SEED LOCALIZATION Left 03/30/2018   Procedure: RADIOACTIVE SEED GUIDED LEFT BREAST LUMPECTOMY;  Surgeon: Gail Favorite, MD;  Location: [facility] OR;  Service: General;  Laterality: Left;   COLONOSCOPY WITH PROPOFOL  N/A 11/06/2015   Procedure: COLONOSCOPY WITH PROPOFOL ;  Surgeon: Gwendlyn ONEIDA Buddy, MD;  Location: [facility] ENDOSCOPY;  Service: Endoscopy;  Laterality: N/A;   ESOPHAGOGASTRODUODENOSCOPY (EGD) WITH PROPOFOL  N/A 11/06/2015   Procedure: ESOPHAGOGASTRODUODENOSCOPY (EGD) WITH PROPOFOL ;  Surgeon: Gwendlyn ONEIDA Buddy, MD;  Location: [facility] ENDOSCOPY;  Service: Endoscopy;  Laterality: N/A;   EYE SURGERY     Family History Family History  Problem Relation Age of Onset   Aneurysm Mother        brain   Heart disease Father    Heart disease Brother    AAA (abdominal aortic aneurysm) Maternal Uncle    Colon cancer Paternal Uncle        dx >50   Breast cancer Cousin        pat cousin, dx in her late 50s    Social History Social History   Tobacco Use   Smoking status: Never   Smokeless tobacco: Never  Vaping Use   Vaping status: Never Used  Substance Use Topics   Alcohol use: No    Drug use: No   Allergies Sulfa antibiotics  Review of Systems A  thorough review of systems was obtained and all systems are negative except as noted in the HPI and PMH.   Physical Exam Vital Signs  I have reviewed the triage vital signs BP (!) 146/80   Pulse 60   Temp 97.7 F (36.5 C) (Oral)   Resp 18   SpO2 100%  Physical Exam Vitals and nursing note reviewed.  Constitutional:      General: She is not in acute distress.    Appearance: Normal appearance.  HENT:     Head: Normocephalic and atraumatic.     Right Ear: External ear normal.     Left Ear: External ear normal.     Nose: Nose normal.     Mouth/Throat:     Mouth: Mucous membranes are moist.  Eyes:     General: No scleral icterus.       Right eye: No discharge.        Left eye: No discharge.  Cardiovascular:     Rate and Rhythm: Normal rate and regular rhythm.     Pulses: Normal pulses.     Heart sounds: Normal heart sounds.  Pulmonary:     Effort: Pulmonary effort is normal. No respiratory distress.     Breath sounds: Normal breath sounds. No stridor.  Abdominal:     General: Abdomen is flat. There is no distension.     Palpations: Abdomen is soft.     Tenderness: There is generalized abdominal tenderness.  Musculoskeletal:     Cervical back: No rigidity.     Right lower leg: No edema.     Left lower leg: No edema.  Skin:    General: Skin is warm and dry.     Capillary Refill: Capillary refill takes less than 2 seconds.  Neurological:     Mental Status: She is alert.  Psychiatric:        Mood and Affect: Mood normal.        Behavior: Behavior normal. Behavior is cooperative.     ED Results and Treatments Labs (all labs ordered are listed, but only abnormal results are displayed) Labs Reviewed  COMPREHENSIVE METABOLIC PANEL WITH GFR - Abnormal; Notable for the following components:      Result Value   Glucose, Bld 103 (*)    GFR, Estimated 54 (*)    All other components within normal limits   CBC - Abnormal; Notable for the following components:   RBC 3.62 (*)    Hemoglobin 11.4 (*)    All other components within normal limits  LIPASE, BLOOD                                                                                                                          Radiology No results found.   Pertinent labs & imaging results that were available during my care of the patient were reviewed by me and considered in my medical decision making (see MDM for details).  Medications Ordered in ED Medications  iohexol  (OMNIPAQUE ) 300 MG/ML solution 100 mL (100 mLs Intravenous Contrast Given 08/23/24 2323)                                                                                                                                     Procedures Procedures  (including critical care time)  Medical Decision Making / ED Course    Medical Decision Making:    BRYANNA YIM is a 89 y.o. female  with past medical history as below, significant for breast cancer follows with Dr. Loretha in remission, hemorrhoids, anemia, cataracts who presents to the ED with complaint of dental pain for months. The complaint involves an extensive differential diagnosis and also carries with it a high risk of complications and morbidity.  Serious etiology was considered. Ddx includes but is not limited to: Differential diagnosis includes but is not exclusive to acute cholecystitis, intrathoracic causes for epigastric abdominal pain, gastritis, duodenitis, pancreatitis, small bowel or large bowel obstruction, abdominal aortic aneurysm, hernia, gastritis, etc.   Complete initial physical exam performed, notably the patient was in no acute distress, resting comfortably.    Reviewed and confirmed nursing documentation for past medical history, family history, social history.  Vital signs reviewed.    Generalized abdominal pain Unintentional weight loss Presumed pancreatic adenocarcinoma with metastatic disease  > - Patient with generalized abdominal pain over the past few months, weight loss, poor p.o. intake, fatigue.  Lab results today are reassuring.  Obtained CT abdomen pelvis which is concerning for primary pancreatic carcinoma with metastatic disease to spleen, lungs, colon.  Patient feels her pain is well-controlled with oral medications, she is tolerating p.o. despite intermittent nausea.  She is HDS.  Labs are stable.  Discussed treatment options going forward, she prefers to follow-up with her outpatient oncology team as she has spouse at home that she is the primary caretaker for.  I sent a staff message to Dr.Iruku regarding patient's presentation in the ER today, advised the patient to call oncology office Monday morning as well as her PCP office Monday morning to coordinate care.  Patient's symptoms today likely secondary to metastatic disease, I did prescribe her narcotic pain medication, antiemetic.  Gave opiate medication precautions.  Abdominal pain precautions as well.  Patient in no distress and overall condition is stable. Detailed discussions were had with the patient/guardian regarding current findings, and need for close f/u with PCP or on call doctor. The patient/guardian has been instructed to return immediately if the symptoms worsen in any way for re-evaluation. Patient/guardian verbalized understanding and is in agreement with current care plan. All questions answered prior to discharge.                       Additional history obtained: -Additional history obtained from family -External records from outside source obtained and reviewed including: Chart review including previous notes, labs, imaging, consultation notes including  Primary care  documentation Prior labs Home meds   Lab Tests: -I ordered, reviewed, and interpreted labs.   The pertinent results include:   Labs Reviewed  COMPREHENSIVE METABOLIC PANEL WITH GFR - Abnormal; Notable for the following  components:      Result Value   Glucose, Bld 103 (*)    GFR, Estimated 54 (*)    All other components within normal limits  CBC - Abnormal; Notable for the following components:   RBC 3.62 (*)    Hemoglobin 11.4 (*)    All other components within normal limits  LIPASE, BLOOD    Notable for labs stable  EKG   EKG Interpretation Date/Time:    Ventricular Rate:    PR Interval:    QRS Duration:    QT Interval:    QTC Calculation:   R Axis:      Text Interpretation:           Imaging Studies ordered: I ordered imaging studies including CTAP I independently visualized the following imaging with scope of interpretation limited to determining acute life threatening conditions related to emergency care; findings noted above I agree with the radiologist interpretation If any imaging was obtained with contrast I closely monitored patient for any possible adverse reaction a/w contrast administration in the emergency department   Medicines ordered and prescription drug management: Meds ordered this encounter  Medications   iohexol  (OMNIPAQUE ) 300 MG/ML solution 100 mL   ondansetron  (ZOFRAN ) 4 MG tablet    Sig: Take 1 tablet (4 mg total) by mouth every 4 (four) hours as needed for nausea or vomiting.    Dispense:  12 tablet    Refill:  0   oxyCODONE  (ROXICODONE ) 5 MG immediate release tablet    Sig: Take 1 tablet (5 mg total) by mouth every 6 (six) hours as needed for severe pain (pain score 7-10) or breakthrough pain.    Dispense:  15 tablet    Refill:  0   acetaminophen  (TYLENOL ) 325 MG tablet    Sig: Take 2 tablets (650 mg total) by mouth every 6 (six) hours as needed.    Dispense:  36 tablet    Refill:  0   DISCONTD: sucralfate  (CARAFATE ) 1 g tablet    Sig: Take 1 tablet (1 g total) by mouth with breakfast, with lunch, and with evening meal for 7 days.    Dispense:  21 tablet    Refill:  0    -I have reviewed the patients home medicines and have made adjustments as  needed   Consultations Obtained: na   Cardiac Monitoring: Continuous pulse oximetry interpreted by myself, 100% on RA.    Social Determinants of Health:  Diagnosis or treatment significantly limited by social determinants of health: na   Reevaluation: After the interventions noted above, I reevaluated the patient and found that they have improved  Co morbidities that complicate the patient evaluation  Past Medical History:  Diagnosis Date   Breast cancer (HCC) 2019   Left Breast Cancer   Breast cancer in female [facility])    Left   Ole lesion, acute: Per EGD 11/06/2015 11/06/2015   Cataract    Erosive gastritis: per EGD 11/05/2105 11/06/2015   Family history of breast cancer    Family history of colon cancer    Heart murmur    Hemorrhoids; Grade 1 per colonoscopy 11/06/2015 11/06/2015   Knee pain       Dispostion: Disposition decision including need for hospitalization was considered, and patient discharged  from emergency department.    Final Clinical Impression(s) / ED Diagnoses Final diagnoses:  Pancreatic mass  Liver lesion  Splenic lesion  Pulmonary nodules/lesions, multiple        Elnor Jayson LABOR, DO 08/27/24 1546

## 2024-08-23 NOTE — ED Triage Notes (Signed)
 Pt came in for bilateral abdominal pain that's been going on for months. Pt has not vomited but has been nauseous every time with eating.   Hx diverticulitis

## 2024-08-23 NOTE — Telephone Encounter (Signed)
 FYI Only or Action Required?: FYI only for provider.  Patient was last seen in primary care on 07/31/2024 by Copland, Harlene BROCKS, MD.  Called Nurse Triage for Results.  Triage Disposition: Call PCP Within 24 Hours  Patient/caregiver understands and will follow disposition?: Yes      Copied from CRM #[id number]. Topic: Clinical - Red Word Triage >> Aug 23, 2024  4:41 PM Zebedee SAUNDERS wrote: Red Word that prompted transfer to Nurse Triage: Pt's daughter Radford,Cynthia [phone] calling for imaging results from US  ABDOMEN LIMITED RUQ (LIVER/GB) (Accession [id number]) (Order [id number]), pt is in pain. Daughter wants to take pt to hospital.      Reason for Disposition  Caller requesting lab results  (Exception: Routine or non-urgent lab result.)  Answer Assessment - Initial Assessment Questions Patient's daughter calling for ultrasound results. I advised that the results have not come back yet. She reports that the patient is in pain and she is planning on taking her to the ED. No further information about symptoms given at this time.       1. REASON FOR CALL or QUESTION: What is your reason for calling today? or How can I best     Ultrasound results  2. CALLER: Document the source of call. (e.g., laboratory staff, caregiver or patient).     Patient's daughter [name], [place]  Protocols used: PCP Call - No Triage-A-AH

## 2024-08-23 NOTE — Telephone Encounter (Signed)
 Imaging delayed .    Copied from CRM [id number]. Topic: Clinical - Lab/Test Results >> Aug 23, 2024 10:14 AM Tinnie BROCKS wrote: Reason for CRM: FYI only- Daughter [name] is calling to get ultrasound results. No results available yet. She requested I send a message to Dr. Watt to let her know they need the results as soon as possible.

## 2024-08-24 MED ORDER — ONDANSETRON HCL 4 MG PO TABS: 4.0000 mg | ORAL_TABLET | ORAL | 0 refills | Status: DC | PRN

## 2024-08-24 MED ORDER — ACETAMINOPHEN 325 MG PO TABS: 650.0000 mg | ORAL_TABLET | Freq: Four times a day (QID) | ORAL | 0 refills | Status: DC | PRN

## 2024-08-24 MED ORDER — OXYCODONE HCL 5 MG PO TABS: 5.0000 mg | ORAL_TABLET | Freq: Four times a day (QID) | ORAL | 0 refills | Status: DC | PRN

## 2024-08-24 MED ORDER — SUCRALFATE 1 G PO TABS: 1.0000 g | ORAL_TABLET | Freq: Three times a day (TID) | ORAL | 0 refills | Status: DC

## 2024-08-24 NOTE — Discharge Instructions (Addendum)
 It was a pleasure caring for you today in the emergency department.  Your imaging today is concerning for cancer to your pancreas.  It looks like the cancer has potentially spread to other parts of your body concerning for metastatic disease.  Please call Dr. Walter office on Monday to arrange follow-up. Please return to the emergency department for any worsening or worrisome symptoms including but not limited to worsening abdominal pain, vomiting, fevers, yellowing of skin, blood in your stool, etc.

## 2024-08-26 ENCOUNTER — Telehealth (INDEPENDENT_AMBULATORY_CARE_PROVIDER_SITE_OTHER): Admitting: Family Medicine

## 2024-08-26 ENCOUNTER — Telehealth: Payer: Self-pay | Admitting: Family Medicine

## 2024-08-26 DIAGNOSIS — C253 Malignant neoplasm of pancreatic duct: Secondary | ICD-10-CM

## 2024-08-26 DIAGNOSIS — K2970 Gastritis, unspecified, without bleeding: Secondary | ICD-10-CM

## 2024-08-26 MED ORDER — SUCRALFATE 1 G PO TABS: 1.0000 g | ORAL_TABLET | Freq: Three times a day (TID) | ORAL | 1 refills | Status: DC

## 2024-08-26 NOTE — Progress Notes (Signed)
 [facility] ([facility]) [address], [address] [place], [place] [address] [phone]  Date:  08/26/2024   Name:  Kathy Thornton   DOB:  1934/01/10   MRN:  [id number]  PCP:  Watt Harlene BROCKS, MD    Chief Complaint: Follow-up   History of Present Illness:  Kathy Thornton is a 89 y.o. very pleasant female patient who presents with the following:  Virtual visit today to discuss recent dx of pancreatic cancer-telephone only as they do not have MyChart available Spoke with pt and daughter [name] on the phone  Their location is home, my location is office.  Patient identity confirmed with 2 factors, she gives consent for virtual visit today. She went to the ER 9/19 with concern of belly pain and had a CT which revealed almost certain metastatic pancreatic cancer as below.  She had complained of nausea and I ordered a RUQ US  in August which also came back, it was actually normal   IMPRESSION: 1. Suspected pancreatic adenocarcinoma involving the distal pancreatic tail. 2. Involvement of the splenic hilum with possible splenic infarct. Possible involvement of the descending colon near the splenic flexure. 3. Suspected hepatic metastases. 4. Multifocal pulmonary metastases, incompletely visualized.  Discussed the use of AI scribe software for clinical note transcription with the patient, who gave verbal consent to proceed.  Discussed the use of AI scribe software for clinical note transcription with the patient, who gave verbal consent to proceed.  History of Present Illness Kathy Thornton is an 89 year old female with pancreatic cancer who presents for pain management and follow-up on recent diagnostic findings. She is accompanied by her family members, including a person named [place].  A CT scan from 2020 was normal, indicating the cancer developed after that time.  For pain management, she is taking tramadol  for knee pain and Tylenol ,  which she finds effective. She has been prescribed sucralfate  to coat her stomach, improving her ability to eat without pain. Oxycodone  was prescribed but she finds it too strong and prefers not to use it unless necessary.  She has a history of gallstones, which are currently asymptomatic. Her family reports improved eating since starting sucralfate , as she previously experienced pain after eating.     Patient Active Problem List   Diagnosis Date Noted   Knee pain 05/17/2021   Cataract 05/17/2021   Genetic testing 03/27/2018   Family history of colon cancer    Family history of breast cancer    Malignant neoplasm of upper-inner quadrant of left breast in female, estrogen receptor positive (HCC) 01/02/2018   Osteoporosis 01/02/2018   Cameron lesion, acute: Per EGD 11/06/2015 11/06/2015   Erosive gastritis: per EGD 11/05/2105 11/06/2015   Hemorrhoids; Grade 1 per colonoscopy 11/06/2015 11/06/2015   Anemia, iron  deficiency    Acute gastric ulcer    Anemia 11/05/2015   Symptomatic anemia 11/05/2015   Heart murmur 11/05/2015   Lower urinary tract infectious disease 11/05/2015   Lower extremity edema 11/05/2015   Arthritis of both knees 11/04/2015    Past Medical History:  Diagnosis Date   Breast cancer (HCC) 2019   Left Breast Cancer   Breast cancer in female [facility])    Left   Ole lesion, acute: Per EGD 11/06/2015 11/06/2015   Cataract    Erosive gastritis: per EGD 11/05/2105 11/06/2015   Family history of breast cancer    Family history of colon cancer    Heart  murmur    Hemorrhoids; Grade 1 per colonoscopy 11/06/2015 11/06/2015   Knee pain     Past Surgical History:  Procedure Laterality Date   BREAST LUMPECTOMY Left 03/30/2018   BREAST LUMPECTOMY WITH RADIOACTIVE SEED LOCALIZATION Left 03/30/2018   Procedure: RADIOACTIVE SEED GUIDED LEFT BREAST LUMPECTOMY;  Surgeon: Gail Favorite, MD;  Location: [facility] OR;  Service: General;  Laterality: Left;   COLONOSCOPY WITH PROPOFOL  N/A  11/06/2015   Procedure: COLONOSCOPY WITH PROPOFOL ;  Surgeon: Gwendlyn ONEIDA Buddy, MD;  Location: [facility] ENDOSCOPY;  Service: Endoscopy;  Laterality: N/A;   ESOPHAGOGASTRODUODENOSCOPY (EGD) WITH PROPOFOL  N/A 11/06/2015   Procedure: ESOPHAGOGASTRODUODENOSCOPY (EGD) WITH PROPOFOL ;  Surgeon: Gwendlyn ONEIDA Buddy, MD;  Location: [facility] ENDOSCOPY;  Service: Endoscopy;  Laterality: N/A;   EYE SURGERY      Social History   Tobacco Use   Smoking status: Never   Smokeless tobacco: Never  Vaping Use   Vaping status: Never Used  Substance Use Topics   Alcohol use: No   Drug use: No    Family History  Problem Relation Age of Onset   Aneurysm Mother        brain   Heart disease Father    Heart disease Brother    AAA (abdominal aortic aneurysm) Maternal Uncle    Colon cancer Paternal Uncle        dx >50   Breast cancer Cousin        pat cousin, dx in her late 42s    Allergies  Allergen Reactions   Sulfa Antibiotics Hives    Medication list has been reviewed and updated.  Current Outpatient Medications on File Prior to Visit  Medication Sig Dispense Refill   acetaminophen  (TYLENOL ) 325 MG tablet Take 2 tablets (650 mg total) by mouth every 6 (six) hours as needed. 36 tablet 0   Ascorbic Acid (VITAMIN C) 500 MG CAPS Take 500 mg by mouth daily.     Cholecalciferol (VITAMIN D3 PO) Take by mouth daily.     Cinnamon 500 MG capsule Take 1 capsule (500 mg total) by mouth daily.     ELIQUIS  5 MG TABS tablet TAKE 1 TABLET(5 MG) BY MOUTH TWICE DAILY 60 tablet 5   ferrous sulfate  325 (65 FE) MG tablet Take 1 tablet (325 mg total) by mouth daily with breakfast.     furosemide  (LASIX ) 20 MG tablet      furosemide  (LASIX ) 40 MG tablet Take 1 tablet (40 mg total) by mouth daily. 90 tablet 3   metoprolol  succinate (TOPROL -XL) 25 MG 24 hr tablet TAKE 1 TABLET(25 MG) BY MOUTH DAILY 90 tablet 3   Multiple Vitamins-Minerals (WOMENS MULTIVITAMIN PO) Take by mouth.     ondansetron  (ZOFRAN ) 4 MG tablet Take 1 tablet (4 mg  total) by mouth every 4 (four) hours as needed for nausea or vomiting. 12 tablet 0   oxyCODONE  (ROXICODONE ) 5 MG immediate release tablet Take 1 tablet (5 mg total) by mouth every 6 (six) hours as needed for severe pain (pain score 7-10) or breakthrough pain. 15 tablet 0   pantoprazole  (PROTONIX ) 40 MG tablet TAKE 1 TABLET BY MOUTH DAILY 90 tablet 1   potassium chloride  (KLOR-CON ) 10 MEQ tablet TAKE 1 TABLET BY MOUTH DAILY AS NEEDED WHEN TAKING FUROSEMIDE  90 tablet 0   traMADol  (ULTRAM ) 50 MG tablet TAKE 1 TABLET BY MOUTH EVERY 8 HOURS AS NEEDED FOR MODERATE PAIN 90 tablet 1   Turmeric (QC TUMERIC COMPLEX PO) Take by mouth.     vitamin  B-12 (CYANOCOBALAMIN ) 100 MCG tablet Take 100 mcg by mouth daily.     No current facility-administered medications on file prior to visit.    Review of Systems:  As per HPI- otherwise negative.   Physical Examination: There were no vitals filed for this visit. There were no vitals filed for this visit. There is no height or weight on file to calculate BMI. Ideal Body Weight:        Assessment and Plan: Malignant neoplasm of pancreatic duct (HCC) - Plan: Ambulatory referral to Hematology / Oncology  Gastritis without bleeding, unspecified chronicity, unspecified gastritis type - Plan: sucralfate  (CARAFATE ) 1 g tablet  Assessment & Plan Metastatic pancreatic cancer with liver, spleen, and lung involvement Discussed unfortunate likely diagnosis of pancreatic cancer with metastases to liver, spleen, and lungs with patient and her daughter and husband.  Most likely this cancer is aggressive with poor prognosis. Primary tumor in pancreatic tail.  They asked me to estimate her survival time, I advised I am not an expert oncologist but I would expect about 6 to 12 months - Refer to oncologist Dr. [name] for management. - Discuss possible palliative treatments including radiation and chemotherapy with oncologist. - Discuss option of no treatment if  preferred.  Offered support to patient and matter what she chooses  Neoplasm-related chronic pain Chronic pain from metastatic pancreatic cancer. Managed with tramadol  and acetaminophen .  She does have some oxycodone  on hand but has not used it due to concern that it is too strong- Allow breaking oxycodone  tablets if needed.  However for now she is happy with just tramadol  and acetaminophen , she will let me know if I can help with pain control  Gastritis responding to sucralfate .  Overall this seems to be helping with the symptoms that actually took her to the ER recently. - Extend sucralfate  for another month.  General Health Maintenance Discussed importance of flu vaccine before cancer treatment. - Administer flu vaccine as soon as possible.  Goals of Care Discussed palliative care goals to maintain quality of life. Cancer not curable. Patient understands prognosis and is open to treatment options. Values quality of life and would also consider no treatment option. - Discuss treatment options with oncologist for quality of life. - Consider patient's preferences in decision-making.  Signed Harlene Schroeder, MD

## 2024-08-26 NOTE — Telephone Encounter (Signed)
 Pt has scheduled OV this afternoon to discuss results.

## 2024-08-26 NOTE — Telephone Encounter (Signed)
 Copied from CRM [id number]. Topic: Clinical - Lab/Test Results >> Aug 23, 2024 10:14 AM Tinnie BROCKS wrote: Reason for CRM: FYI only- Daughter [name] is calling to get ultrasound results. No results available yet. She requested I send a message to Dr. Watt to let her know they need the results as soon as possible.

## 2024-08-27 ENCOUNTER — Encounter: Payer: Self-pay | Admitting: *Deleted

## 2024-08-27 ENCOUNTER — Other Ambulatory Visit: Payer: Self-pay | Admitting: *Deleted

## 2024-08-27 ENCOUNTER — Telehealth: Payer: Self-pay | Admitting: Family Medicine

## 2024-08-27 NOTE — Progress Notes (Signed)
 Reached out to Kathy Thornton to introduce myself as the office RN Navigator and explain our new patient process. Spoke with her daughter,  Kathy Thornton. Reviewed the reason for their referral and scheduled their new patient appointment along with labs. Provided address and directions to the office including call back phone number. Reviewed with patient any concerns they may have or any possible barriers to attending their appointment.   Informed patient about my role as a navigator and that I will meet with them prior to their New Patient appointment and more fully discuss what services I can provide. At this time patient has no further questions or needs.    Oncology Nurse Navigator Documentation     08/27/2024    8:15 AM  Oncology Nurse Navigator Flowsheets  Abnormal Finding Date 08/23/2024  Diagnosis Status Additional Work Up  Navigator Follow Up Date: 08/29/2024  Navigator Follow Up Reason: New Patient Appointment  Navigator Location [facility]  Referral Date to RadOnc/MedOnc 08/26/2024  Navigator Encounter Type Introductory Phone Call  Patient Visit Type MedOnc  Treatment Phase Abnormal Scans  Barriers/Navigation Needs Coordination of Care;Education  Education Other  Interventions Coordination of Care;Education  Acuity Level 2-Minimal Needs (1-2 Barriers Identified)  Coordination of Care Appts  Education Method Verbal;Teach-back  Support Groups/Services Friends and Family  Time Spent with Patient 30

## 2024-08-27 NOTE — Telephone Encounter (Signed)
 Copied from CRM [id number]. Topic: General - Call Back - No Documentation >> Aug 26, 2024 12:45 PM Robinson H wrote: Reason for CRM: Patients daughter states she was disconnected from a call with office, no notes or messages in system.  [name] [phone]

## 2024-08-29 ENCOUNTER — Encounter: Payer: Self-pay | Admitting: *Deleted

## 2024-08-29 ENCOUNTER — Encounter: Payer: Self-pay | Admitting: Hematology & Oncology

## 2024-08-29 ENCOUNTER — Inpatient Hospital Stay (HOSPITAL_BASED_OUTPATIENT_CLINIC_OR_DEPARTMENT_OTHER): Admitting: Hematology & Oncology

## 2024-08-29 ENCOUNTER — Inpatient Hospital Stay: Attending: Hematology & Oncology

## 2024-08-29 VITALS — BP 157/64 | HR 65 | Temp 97.7°F | Resp 20 | Ht <= 58 in | Wt 132.1 lb

## 2024-08-29 DIAGNOSIS — R5383 Other fatigue: Secondary | ICD-10-CM

## 2024-08-29 DIAGNOSIS — Z79899 Other long term (current) drug therapy: Secondary | ICD-10-CM | POA: Diagnosis not present

## 2024-08-29 DIAGNOSIS — K8010 Calculus of gallbladder with chronic cholecystitis without obstruction: Secondary | ICD-10-CM | POA: Diagnosis not present

## 2024-08-29 DIAGNOSIS — Z8249 Family history of ischemic heart disease and other diseases of the circulatory system: Secondary | ICD-10-CM

## 2024-08-29 DIAGNOSIS — Z803 Family history of malignant neoplasm of breast: Secondary | ICD-10-CM | POA: Insufficient documentation

## 2024-08-29 DIAGNOSIS — Z7901 Long term (current) use of anticoagulants: Secondary | ICD-10-CM | POA: Diagnosis not present

## 2024-08-29 DIAGNOSIS — Z882 Allergy status to sulfonamides status: Secondary | ICD-10-CM | POA: Insufficient documentation

## 2024-08-29 DIAGNOSIS — Z800 Family history of malignant neoplasm of digestive organs: Secondary | ICD-10-CM | POA: Insufficient documentation

## 2024-08-29 DIAGNOSIS — R1909 Other intra-abdominal and pelvic swelling, mass and lump: Secondary | ICD-10-CM

## 2024-08-29 DIAGNOSIS — Z853 Personal history of malignant neoplasm of breast: Secondary | ICD-10-CM

## 2024-08-29 DIAGNOSIS — C50212 Malignant neoplasm of upper-inner quadrant of left female breast: Secondary | ICD-10-CM

## 2024-08-29 DIAGNOSIS — D735 Infarction of spleen: Secondary | ICD-10-CM | POA: Insufficient documentation

## 2024-08-29 DIAGNOSIS — R1110 Vomiting, unspecified: Secondary | ICD-10-CM | POA: Diagnosis not present

## 2024-08-29 DIAGNOSIS — Z8719 Personal history of other diseases of the digestive system: Secondary | ICD-10-CM | POA: Diagnosis not present

## 2024-08-29 DIAGNOSIS — K769 Liver disease, unspecified: Secondary | ICD-10-CM | POA: Insufficient documentation

## 2024-08-29 DIAGNOSIS — R918 Other nonspecific abnormal finding of lung field: Secondary | ICD-10-CM | POA: Insufficient documentation

## 2024-08-29 DIAGNOSIS — R634 Abnormal weight loss: Secondary | ICD-10-CM

## 2024-08-29 DIAGNOSIS — K2970 Gastritis, unspecified, without bleeding: Secondary | ICD-10-CM

## 2024-08-29 DIAGNOSIS — R109 Unspecified abdominal pain: Secondary | ICD-10-CM | POA: Insufficient documentation

## 2024-08-29 DIAGNOSIS — K8689 Other specified diseases of pancreas: Secondary | ICD-10-CM

## 2024-08-29 DIAGNOSIS — Z8711 Personal history of peptic ulcer disease: Secondary | ICD-10-CM

## 2024-08-29 LAB — CBC WITH DIFFERENTIAL (CANCER CENTER ONLY)
Abs Immature Granulocytes: 0.02 K/uL (ref 0.00–0.07)
Basophils Absolute: 0.1 K/uL (ref 0.0–0.1)
Basophils Relative: 1 %
Eosinophils Absolute: 0.2 K/uL (ref 0.0–0.5)
Eosinophils Relative: 2 %
HCT: 35 % — ABNORMAL LOW (ref 36.0–46.0)
Hemoglobin: 11.6 g/dL — ABNORMAL LOW (ref 12.0–15.0)
Immature Granulocytes: 0 %
Lymphocytes Relative: 31 %
Lymphs Abs: 2.5 K/uL (ref 0.7–4.0)
MCH: 32.1 pg (ref 26.0–34.0)
MCHC: 33.1 g/dL (ref 30.0–36.0)
MCV: 97 fL (ref 80.0–100.0)
Monocytes Absolute: 0.7 K/uL (ref 0.1–1.0)
Monocytes Relative: 8 %
Neutro Abs: 4.6 K/uL (ref 1.7–7.7)
Neutrophils Relative %: 58 %
Platelet Count: 188 K/uL (ref 150–400)
RBC: 3.61 MIL/uL — ABNORMAL LOW (ref 3.87–5.11)
RDW: 14.3 % (ref 11.5–15.5)
WBC Count: 8.1 K/uL (ref 4.0–10.5)
nRBC: 0 % (ref 0.0–0.2)

## 2024-08-29 LAB — CMP (CANCER CENTER ONLY)
ALT: 7 U/L (ref 0–44)
AST: 17 U/L (ref 15–41)
Albumin: 4.6 g/dL (ref 3.5–5.0)
Alkaline Phosphatase: 83 U/L (ref 38–126)
Anion gap: 10 (ref 5–15)
BUN: 18 mg/dL (ref 8–23)
CO2: 26 mmol/L (ref 22–32)
Calcium: 9.7 mg/dL (ref 8.9–10.3)
Chloride: 102 mmol/L (ref 98–111)
Creatinine: 1.06 mg/dL — ABNORMAL HIGH (ref 0.44–1.00)
GFR, Estimated: 50 mL/min — ABNORMAL LOW (ref >60)
Glucose, Bld: 113 mg/dL — ABNORMAL HIGH (ref 70–99)
Potassium: 4.4 mmol/L (ref 3.5–5.1)
Sodium: 138 mmol/L (ref 135–145)
Total Bilirubin: 0.4 mg/dL (ref 0.0–1.2)
Total Protein: 7.2 g/dL (ref 6.5–8.1)

## 2024-08-29 LAB — PREALBUMIN: Prealbumin: 16 mg/dL — ABNORMAL LOW (ref 18–38)

## 2024-08-29 NOTE — Progress Notes (Signed)
 Initial RN Navigator Patient Visit  Name: [name] Date of Referral : 08/26/2024 Diagnosis: Pancreatic Mass  Met with patient prior to their visit with MD. Kathy Thornton patient Your Patient Navigator handout which explains my role, areas in which I am able to help, and all the contact information for myself and the office. Also gave patient MD and Navigator business card. Reviewed with patient the general overview of expected course after initial diagnosis and time frame for all steps to be completed.  New patient packet given to patient which includes: orientation to office and staff; campus directory; education on My Chart and Advance Directives; and patient centered education on pancreatic cancer.   Patient comes in with her husband and three of her eight children. She lives with her husband, but children and grandchildren all live nearby and are very supportive. Her daughter [name] is her primary transportation to medical appointments, but has many back ups.   Referral to social work and nutrition placed per protocol. Information provided to them about possible genetics. Of note she had genetic testing for breast cancer previously, so this might not be necessary.   Patient completed visit with Dr. [name]. Once office note dictated and orders placed will begin scheduling.   Patient understands all follow up procedures and expectations. They have my number to reach out for any further clarification or additional needs.   Oncology Nurse Navigator Documentation     08/29/2024    2:00 PM  Oncology Nurse Navigator Flowsheets  Navigator Follow Up Date: 08/30/2024  Navigator Follow Up Reason: Appointment Review  Navigator Location [facility]  Navigator Encounter Type Initial MedOnc  Patient Visit Type MedOnc  Treatment Phase Abnormal Scans  Barriers/Navigation Needs Coordination of Care;Education  Education Newly Diagnosed Cancer Education;Pain/ Symptom Management  Interventions  Education;Psycho-Social Support;Referrals  Acuity Level 2-Minimal Needs (1-2 Barriers Identified)  Referrals Nutrition/dietician;Social Work  Nature conservation officer Groups/Services Friends and Family  Time Spent with Patient 30

## 2024-08-29 NOTE — Progress Notes (Unsigned)
 BP remains elevated, 157/64, patient monitors tid at home. BP is always WNL. Instructed to continue and notify PCP if it is over 140/90. Verbalized understanding.

## 2024-08-30 ENCOUNTER — Inpatient Hospital Stay

## 2024-08-30 ENCOUNTER — Encounter: Payer: Self-pay | Admitting: *Deleted

## 2024-08-30 LAB — CANCER ANTIGEN 19-9: CA 19-9: 12 U/mL (ref 0–35)

## 2024-08-30 MED ORDER — PANTOPRAZOLE SODIUM 40 MG PO TBEC: 40.0000 mg | DELAYED_RELEASE_TABLET | Freq: Two times a day (BID) | ORAL | 2 refills | Status: DC

## 2024-08-30 MED ORDER — SUCRALFATE 1 G PO TABS: 1.0000 g | ORAL_TABLET | Freq: Three times a day (TID) | ORAL | 4 refills | Status: DC

## 2024-08-30 NOTE — Progress Notes (Signed)
 Patient was seen yesterday and given recommendations. At this time her and her family are going to consider all options and let us  know how they want to proceed.   Received a call from [place]. She calls with her sister and her mother on the line. They are interested on trying Ivermectin and have found a pharmacy that will dispense with an MD order. Spoke to Dr [name] and he is unable to provide a prescription with his medical licence.   Called [name] back and notified her of response. Dr [name] would like some additional lab work done. Orders were placed and patient's daughter transferred to scheduling.   Oncology Nurse Navigator Documentation     08/30/2024    3:00 PM  Oncology Nurse Navigator Flowsheets  Navigator Follow Up Date: 09/03/2024  Navigator Follow Up Reason: Test Results  Navigator Location [facility]  Navigator Encounter Type Appt/Treatment Plan Review;Telephone  Telephone Incoming Call;Outgoing Call  Patient Visit Type MedOnc  Treatment Phase Abnormal Scans  Barriers/Navigation Needs Coordination of Care;Education  Education Other  Interventions Education;Medication Assistance;Coordination of Care  Acuity Level 2-Minimal Needs (1-2 Barriers Identified)  Coordination of Care Appts  Education Method Verbal  Support Groups/Services Friends and Family  Time Spent with Patient 30

## 2024-08-30 NOTE — Progress Notes (Signed)
 Referral MD  Reason for Referral: Pancreatic mass with metastasis.  Chief Complaint  Patient presents with   New Patient (Initial Visit)     I have pancreatic cancer.  : I have pancreatic cancer   HPI: Kathy Thornton is a very charming 89 year old white female.  She is certainly in quite good shape.  She does have bad knees.  She was having some issues back in August.  She is having some nausea.  She I think was not eating as much.  She was not having any obvious change in bowel habits.  There is no urinary symptoms.  She had ultrasound that was done on 08/21/2024.  This was relatively unremarkable.  She had some gallstones but nothing that looks like cholecystitis.  She has symptoms that appear to continue.  She then had a CT of the abdomen pelvis.  She had gone to the emergency room because of abdominal pain.  This was on October 19.  A CT scan was done.  This showed multiple pulmonary nodules.  She had a 4.2 x 3.9 cm mass in the distal pancreatic tail.  She had some liver lesions.  She had a splenic infarct.  Because of this, she was kindly referred to the [facility] and [facility].  Patient is been taking tramadol  for the pain.  She actually looks quite good.  She comes in with several family members.  She has had no bleeding.  She has had no fever.  There is been no night sweats.  She has had no leg swelling.  She has had no rashes.  She has had no issues with COVID recently.  She has had no problems with weight gain.  She may have lost maybe 5 pounds.  There has been no urinary symptoms.  Overall, I would have said that her performance status is probably ECOG 2.   Past Medical History:  Diagnosis Date   Breast cancer (HCC) 2019   Left Breast Cancer   Breast cancer in female [facility])    Left   Ole lesion, acute: Per EGD 11/06/2015 11/06/2015   Cataract    Erosive gastritis: per EGD 11/05/2105 11/06/2015   Family history of breast cancer    Family history of colon  cancer    Heart murmur    Hemorrhoids; Grade 1 per colonoscopy 11/06/2015 11/06/2015   Knee pain   :   Past Surgical History:  Procedure Laterality Date   BREAST LUMPECTOMY Left 03/30/2018   BREAST LUMPECTOMY WITH RADIOACTIVE SEED LOCALIZATION Left 03/30/2018   Procedure: RADIOACTIVE SEED GUIDED LEFT BREAST LUMPECTOMY;  Surgeon: Gail Favorite, MD;  Location: [facility] OR;  Service: General;  Laterality: Left;   COLONOSCOPY WITH PROPOFOL  N/A 11/06/2015   Procedure: COLONOSCOPY WITH PROPOFOL ;  Surgeon: Gwendlyn ONEIDA Buddy, MD;  Location: [facility] ENDOSCOPY;  Service: Endoscopy;  Laterality: N/A;   ESOPHAGOGASTRODUODENOSCOPY (EGD) WITH PROPOFOL  N/A 11/06/2015   Procedure: ESOPHAGOGASTRODUODENOSCOPY (EGD) WITH PROPOFOL ;  Surgeon: Gwendlyn ONEIDA Buddy, MD;  Location: [facility] ENDOSCOPY;  Service: Endoscopy;  Laterality: N/A;   EYE SURGERY    :   Current Outpatient Medications:    acetaminophen  (TYLENOL ) 325 MG tablet, Take 2 tablets (650 mg total) by mouth every 6 (six) hours as needed., Disp: 36 tablet, Rfl: 0   Ascorbic Acid (VITAMIN C) 500 MG CAPS, Take 500 mg by mouth daily., Disp: , Rfl:    Biotin 1 MG CAPS, Take 2 mg by mouth daily., Disp: , Rfl:    Cholecalciferol (VITAMIN D3 PO),  Take by mouth daily. (Patient taking differently: Take 5,000 Units by mouth daily.), Disp: , Rfl:    ELIQUIS  5 MG TABS tablet, TAKE 1 TABLET(5 MG) BY MOUTH TWICE DAILY, Disp: 60 tablet, Rfl: 5   ferrous sulfate  325 (65 FE) MG tablet, Take 1 tablet (325 mg total) by mouth daily with breakfast. (Patient taking differently: Take 325 mg by mouth every other day.), Disp: , Rfl:    furosemide  (LASIX ) 20 MG tablet, , Disp: , Rfl:    metoprolol  succinate (TOPROL -XL) 25 MG 24 hr tablet, TAKE 1 TABLET(25 MG) BY MOUTH DAILY, Disp: 90 tablet, Rfl: 3   Multiple Vitamins-Minerals (WOMENS MULTIVITAMIN PO), Take by mouth., Disp: , Rfl:    ondansetron  (ZOFRAN ) 4 MG tablet, Take 1 tablet (4 mg total) by mouth every 4 (four) hours as needed for nausea or  vomiting., Disp: 12 tablet, Rfl: 0   oxyCODONE  (ROXICODONE ) 5 MG immediate release tablet, Take 1 tablet (5 mg total) by mouth every 6 (six) hours as needed for severe pain (pain score 7-10) or breakthrough pain., Disp: 15 tablet, Rfl: 0   pantoprazole  (PROTONIX ) 40 MG tablet, TAKE 1 TABLET BY MOUTH DAILY, Disp: 90 tablet, Rfl: 1   potassium chloride  (KLOR-CON ) 10 MEQ tablet, TAKE 1 TABLET BY MOUTH DAILY AS NEEDED WHEN TAKING FUROSEMIDE , Disp: 90 tablet, Rfl: 0   psyllium (METAMUCIL) 58.6 % packet, Take 1 packet by mouth daily., Disp: , Rfl:    sucralfate  (CARAFATE ) 1 g tablet, Take 1 tablet (1 g total) by mouth with breakfast, with lunch, and with evening meal., Disp: 90 tablet, Rfl: 1   traMADol  (ULTRAM ) 50 MG tablet, TAKE 1 TABLET BY MOUTH EVERY 8 HOURS AS NEEDED FOR MODERATE PAIN, Disp: 90 tablet, Rfl: 1   vitamin B-12 (CYANOCOBALAMIN ) 100 MCG tablet, Take 100 mcg by mouth daily., Disp: , Rfl:    furosemide  (LASIX ) 40 MG tablet, Take 1 tablet (40 mg total) by mouth daily. (Patient not taking: Reported on 08/29/2024), Disp: 90 tablet, Rfl: 3:  :   Allergies  Allergen Reactions   Sulfa Antibiotics Hives  :   Family History  Problem Relation Age of Onset   Aneurysm Mother        brain   Heart disease Father    Heart disease Brother    AAA (abdominal aortic aneurysm) Maternal Uncle    Colon cancer Paternal Uncle        dx >50   Breast cancer Cousin        pat cousin, dx in her late 70s  :   Social History   Socioeconomic History   Marital status: Married    Spouse name: Not on file   Number of children: Not on file   Years of education: Not on file   Highest education level: Not on file  Occupational History   Not on file  Tobacco Use   Smoking status: Never   Smokeless tobacco: Never  Vaping Use   Vaping status: Never Used  Substance and Sexual Activity   Alcohol use: No   Drug use: No   Sexual activity: Not Currently  Other Topics Concern   Not on file  Social  History Narrative   Not on file   Social Drivers of Health   Financial Resource Strain: Low Risk  (08/29/2024)   Overall Financial Resource Strain (CARDIA)    Difficulty of Paying Living Expenses: Not hard at all  Food Insecurity: No Food Insecurity (08/29/2024)   Hunger Vital Sign  Worried About Programme researcher, broadcasting/film/video in the Last Year: Never true    Ran Out of Food in the Last Year: Never true  Transportation Needs: No Transportation Needs (08/29/2024)   PRAPARE - Administrator, Civil Service (Medical): No    Lack of Transportation (Non-Medical): No  Physical Activity: Sufficiently Active (08/29/2024)   Exercise Vital Sign    Days of Exercise per Week: 5 days    Minutes of Exercise per Session: 30 min  Stress: No Stress Concern Present (08/29/2024)   [employer] of Occupational Health - Occupational Stress Questionnaire    Feeling of Stress: Not at all  Social Connections: Moderately Isolated (08/29/2024)   Social Connection and Isolation Panel    Frequency of Communication with Friends and Family: More than three times a week    Frequency of Social Gatherings with Friends and Family: More than three times a week    Attends Religious Services: Never    Database administrator or Organizations: No    Attends Banker Meetings: Never    Marital Status: Married  Catering manager Violence: Not At Risk (08/12/2022)   Humiliation, Afraid, Rape, and Kick questionnaire    Fear of Current or Ex-Partner: No    Emotionally Abused: No    Physically Abused: No    Sexually Abused: No  :  Review of Systems  Constitutional:  Positive for malaise/fatigue and weight loss.  HENT: Negative.    Eyes: Negative.   Respiratory: Negative.    Cardiovascular: Negative.   Gastrointestinal:  Positive for abdominal pain and nausea.  Genitourinary: Negative.   Musculoskeletal:  Positive for joint pain.  Skin: Negative.   Neurological: Negative.   Endo/Heme/Allergies: Negative.    Psychiatric/Behavioral: Negative.       Exam:  Vital signs show temperature of 97.7.  Pulse 65.  Blood pressure 157/64.  Weight is 132 pounds.  @IPVITALS @ Physical Exam Vitals reviewed.  HENT:     Head: Normocephalic and atraumatic.  Eyes:     Pupils: Pupils are equal, round, and reactive to light.  Cardiovascular:     Rate and Rhythm: Normal rate and regular rhythm.     Heart sounds: Normal heart sounds.     Comments: Cardiac exam is regular rate and rhythm with no murmurs, rubs or bruits. Pulmonary:     Effort: Pulmonary effort is normal.     Breath sounds: Normal breath sounds.  Abdominal:     General: Bowel sounds are normal.     Palpations: Abdomen is soft.     Comments: Abdominal exam is soft.  She has decent bowel sounds.  There is no guarding or rebound tenderness.  She has no fluid wave.  There is no palpable hepatomegaly.  Musculoskeletal:        General: No tenderness or deformity. Normal range of motion.     Cervical back: Normal range of motion.     Comments: Extremities show some swelling with her knees bilaterally.  She has some decreased range of motion of her joints.  Lymphadenopathy:     Cervical: No cervical adenopathy.  Skin:    General: Skin is warm and dry.     Findings: No erythema or rash.  Neurological:     Mental Status: She is alert and oriented to person, place, and time.  Psychiatric:        Behavior: Behavior normal.        Thought Content: Thought content normal.  Judgment: Judgment normal.     Recent Labs    08/29/24 1352  WBC 8.1  HGB 11.6*  HCT 35.0*  PLT 188    Recent Labs    08/29/24 1352  NA 138  K 4.4  CL 102  CO2 26  GLUCOSE 113*  BUN 18  CREATININE 1.06*  CALCIUM 9.7    Blood smear review: None  Pathology: None    Assessment and Plan: Kathy Thornton is a very charming 89 year old white female.  I am I said that she is in good shape outside of her poor knees.  What is interesting I think is the fact  that her CA 19-9 is only 12.  I think this would somehow make adenocarcinoma a little bit unusual.  I know that I would really hate to avoid having to do biopsy.  However, a biopsy might be interesting at this time.  It is also possible this might be some kind of neuroendocrine carcinoma.  I suppose a good could also be some type of islet cell tumor although I cannot see anything that would suggest active hormone secretion.  I will have to talk to her family about the possibility of a biopsy.  Maybe, I can do some additional blood work that might help us  out.  Again, this is something that were not going to cure.  As such, I want to make sure that quality of life is our focus and our goal.  I think this would be a priority.  I know that for Kathy Thornton, quality of life is important.  We did talk about the possibility of Hospice.  We will have the family talk about this.  I will plan to get her back once we have some more information as to what we might be dealing with.  Hopefully, this is not pancreatic adenocarcinoma.  If this is pancreatic adenocarcinoma, then I would think that we are probably looking at about 3-4 months at most as far as prognosis goes.  I know that this could always change if we find that we might be dealing with something different.

## 2024-08-30 NOTE — Progress Notes (Signed)
 [facility] Clinical Social Work  Clinical Social Work was referred by Statistician for assessment of psychosocial needs.  Clinical Social Worker contacted caregiver by phone to offer support and assess for needs.     Interventions: Provided patient with information about CSW role, including advance directives, counseling and resources.  Spoke with patient's daughter, [name].  She stated patient has advance directives.  CSW encouraged her to bring them into the office to be scanned and placed in patient's chart.       Follow Up Plan:  CSW will follow-up with patient by phone     Macario CHRISTELLA Au, LCSW  Clinical Social Worker [facility]

## 2024-09-02 ENCOUNTER — Inpatient Hospital Stay

## 2024-09-02 DIAGNOSIS — Z8249 Family history of ischemic heart disease and other diseases of the circulatory system: Secondary | ICD-10-CM | POA: Diagnosis not present

## 2024-09-02 DIAGNOSIS — Z7901 Long term (current) use of anticoagulants: Secondary | ICD-10-CM | POA: Diagnosis not present

## 2024-09-02 DIAGNOSIS — K8689 Other specified diseases of pancreas: Secondary | ICD-10-CM

## 2024-09-02 DIAGNOSIS — Z800 Family history of malignant neoplasm of digestive organs: Secondary | ICD-10-CM | POA: Diagnosis not present

## 2024-09-02 DIAGNOSIS — Z79899 Other long term (current) drug therapy: Secondary | ICD-10-CM | POA: Diagnosis not present

## 2024-09-02 DIAGNOSIS — R109 Unspecified abdominal pain: Secondary | ICD-10-CM | POA: Diagnosis not present

## 2024-09-02 DIAGNOSIS — R634 Abnormal weight loss: Secondary | ICD-10-CM | POA: Diagnosis not present

## 2024-09-02 DIAGNOSIS — R1909 Other intra-abdominal and pelvic swelling, mass and lump: Secondary | ICD-10-CM | POA: Diagnosis not present

## 2024-09-02 DIAGNOSIS — Z8719 Personal history of other diseases of the digestive system: Secondary | ICD-10-CM | POA: Diagnosis not present

## 2024-09-02 DIAGNOSIS — R1110 Vomiting, unspecified: Secondary | ICD-10-CM | POA: Diagnosis not present

## 2024-09-02 DIAGNOSIS — R918 Other nonspecific abnormal finding of lung field: Secondary | ICD-10-CM | POA: Diagnosis not present

## 2024-09-02 DIAGNOSIS — K769 Liver disease, unspecified: Secondary | ICD-10-CM | POA: Diagnosis not present

## 2024-09-02 DIAGNOSIS — Z853 Personal history of malignant neoplasm of breast: Secondary | ICD-10-CM | POA: Diagnosis not present

## 2024-09-02 DIAGNOSIS — D735 Infarction of spleen: Secondary | ICD-10-CM | POA: Diagnosis not present

## 2024-09-02 DIAGNOSIS — K8010 Calculus of gallbladder with chronic cholecystitis without obstruction: Secondary | ICD-10-CM | POA: Diagnosis not present

## 2024-09-02 DIAGNOSIS — R5383 Other fatigue: Secondary | ICD-10-CM | POA: Diagnosis not present

## 2024-09-02 DIAGNOSIS — Z882 Allergy status to sulfonamides status: Secondary | ICD-10-CM | POA: Diagnosis not present

## 2024-09-02 DIAGNOSIS — Z803 Family history of malignant neoplasm of breast: Secondary | ICD-10-CM | POA: Diagnosis not present

## 2024-09-02 LAB — LACTATE DEHYDROGENASE: LDH: 253 U/L — ABNORMAL HIGH (ref 98–192)

## 2024-09-03 LAB — CHROMOGRANIN A: Chromogranin A (ng/mL): 232.7 ng/mL — ABNORMAL HIGH (ref 0.0–101.8)

## 2024-09-04 LAB — NEURON-SPECIFIC ENOLASE(NSE), BLOOD: Neuron-specific Enolase, Serum: 13.5 ng/mL (ref 0.0–17.6)

## 2024-09-05 ENCOUNTER — Inpatient Hospital Stay: Attending: Hematology & Oncology | Admitting: Dietician

## 2024-09-05 ENCOUNTER — Other Ambulatory Visit: Payer: Self-pay | Admitting: Hematology & Oncology

## 2024-09-05 DIAGNOSIS — K769 Liver disease, unspecified: Secondary | ICD-10-CM

## 2024-09-05 NOTE — Progress Notes (Addendum)
 Nutrition Assessment: Reached out to patient's daughter  at preferred telephone number.    Reason for Assessment: New Patient Assessment   ASSESSMENT: Patient is an 89 year old female who has been referred to Dr. [name] after a CT scan revealed metastatic pancreatic cancer with liver, spleen, and lung involvement.  Treatment is still under consideration and per MD note hospice has been discussed and family will consider.   Patient's daughter was not with patient at time.  There have been man visitors in home so she had to get grocery supplies.  Daughter reports patient is eating small meals and craving sweets.    Oatmeal, blueberries, pineapple Cabbage and pinto beans Yogurt Drinking coffee all day electrolyte drinks, Ensure or Boost QD usually in afternoon  Nutrition Focused Physical Exam: unable to perform NFPE   Medications: Turmeric, B12, hair skin and nails, Vit D3, Probiotic, mushroom extract   Labs: 08/29/24  GFR 50, Creat 1.06   Anthropometrics: Some gradual weight loss past couple of years, with 10# (7%) past 6 months which is not clinically significant.  Height: 57 Weight: 132# UBW: 145# BMI: 28.59   Estimated Energy Needs  Kcals: 1800-2100 Protein: 60-72 g Fluid: 2.5L   NUTRITION DIAGNOSIS: Food and Nutrition Related Knowledge Deficit related to cancer and associated treatments as evidenced by no prior need for nutrition related information.    INTERVENTION:   Relayed that nutrition services are wrap around service provided at no charge and encouraged continued communication if experiencing any nutritional impact symptoms (NIS). Educated on importance of adequate nourishment with calorie and protein energy intake with nutrient dense foods when possible to maintain weight/strength and QOL.   Discussed ways to add calories/protein to foods (adding cheese, cooking with butter, creamy sauces/gravy)  Encouraged soft moist high protein foods as well as small  frequent meals/snacks.  Suggested oral nutrition supplement after dinner to promote more high protein foods earlier. Encouraged hydration to preserve renal function and help with constipation. Emailed Nutrition Tip sheet for High Protein Snacking, Soft moist protein foods with contact information provided to daughter's email [email or web address].  MONITORING, EVALUATION, GOAL: weight trends, nutrition impact symptoms, PO intake, labs   Next Visit: Remote 2 weeks.  [name], RDN, LDN Registered Dietitian, [facility] Part Time Remote (Usual office hours: Tuesday-Thursday) Mobile: [phone]

## 2024-09-06 ENCOUNTER — Encounter: Payer: Self-pay | Admitting: *Deleted

## 2024-09-06 NOTE — Progress Notes (Signed)
 Lab results from earlier this week show mass is likely a neuroendocrine tumor. She will now need a Dotatate PET. Patient scheduled for 09/12/2024.  Oncology Nurse Navigator Documentation     09/06/2024    8:30 AM  Oncology Nurse Navigator Flowsheets  Navigator Follow Up Date: 09/12/2024  Navigator Follow Up Reason: Scan Review  Navigator Location [facility]  Navigator Encounter Type Appt/Treatment Plan Review  Patient Visit Type MedOnc  Treatment Phase Abnormal Scans  Barriers/Navigation Needs Coordination of Care;Education  Interventions Coordination of Care  Acuity Level 2-Minimal Needs (1-2 Barriers Identified)  Coordination of Care Radiology  Support Groups/Services Friends and Family  Time Spent with Patient 15

## 2024-09-06 NOTE — Progress Notes (Signed)
 Patient is having bright red blood with bowel movements. Blood seen on tissue, some clots/thick. Water slightly pink. Some constipation. Sounds like there isn't much. This has been going on 2-3 days but at first the patient thought it was from a red-color rich diet.  Reviewed above with Dr [name]. He would like patient to start bowel program to include daily miralax (stop if stools become loose). Monitor bleeding. If blood loss picks up or patient develops other symptoms, go to the ED for assessment.   Oncology Nurse Navigator Documentation     09/06/2024    3:15 PM  Oncology Nurse Navigator Flowsheets  Navigator Follow Up Date: 09/12/2024  Navigator Follow Up Reason: Scan Review  Navigator Location [facility]  Navigator Encounter Type Telephone  Telephone Symptom Mgt;Incoming Call  Patient Visit Type MedOnc  Treatment Phase Abnormal Scans  Barriers/Navigation Needs Coordination of Care;Education  Education Pain/ Symptom Management  Interventions Education  Acuity Level 2-Minimal Needs (1-2 Barriers Identified)  Support Groups/Services Friends and Family  Time Spent with Patient 15

## 2024-09-12 ENCOUNTER — Encounter (HOSPITAL_COMMUNITY)
Admission: RE | Admit: 2024-09-12 | Discharge: 2024-09-12 | Disposition: A | Discharge status: Home / Self Care | Source: Ambulatory Visit | Attending: Hematology & Oncology | Admitting: Hematology & Oncology

## 2024-09-12 DIAGNOSIS — R911 Solitary pulmonary nodule: Secondary | ICD-10-CM | POA: Diagnosis not present

## 2024-09-12 DIAGNOSIS — K769 Liver disease, unspecified: Secondary | ICD-10-CM | POA: Diagnosis not present

## 2024-09-12 MED ORDER — COPPER CU 64 DOTATATE 1 MCI/ML IV SOLN
4.0000 | Freq: Once | INTRAVENOUS | Status: AC
  Administered 2024-09-12: 3.938 via INTRAVENOUS

## 2024-09-13 ENCOUNTER — Encounter: Payer: Self-pay | Admitting: *Deleted

## 2024-09-13 ENCOUNTER — Other Ambulatory Visit: Payer: Self-pay | Admitting: Hematology & Oncology

## 2024-09-13 MED ORDER — ACETAMINOPHEN-CODEINE 300-30 MG PO TABS: 1.0000 | ORAL_TABLET | Freq: Three times a day (TID) | ORAL | 0 refills | Status: DC | PRN

## 2024-09-13 NOTE — Progress Notes (Signed)
 Reviewed Dotatate PET which shows no evidence of neuroendocrine tumor.   Oncology Nurse Navigator Documentation     09/13/2024    1:15 PM  Oncology Nurse Navigator Flowsheets  Navigator Follow Up Date: 10/10/2024  Navigator Follow Up Reason: Follow-up Appointment  Navigator Location [facility]  Navigator Encounter Type Scan Review  Patient Visit Type MedOnc  Treatment Phase Abnormal Scans  Barriers/Navigation Needs Coordination of Care;Education  Interventions None Required  Acuity Level 2-Minimal Needs (1-2 Barriers Identified)  Support Groups/Services Friends and Family  Time Spent with Patient 15

## 2024-09-17 ENCOUNTER — Inpatient Hospital Stay: Admitting: Dietician

## 2024-09-17 NOTE — Progress Notes (Signed)
 Nutrition Follow Up: Reached out to patient at daughter's phone which is preferred telephone number.  Both were on speaker on call.  Patient is an 89 year old female who has been referred to Dr. [name] after a CT scan revealed metastatic pancreatic cancer with liver, spleen, and lung involvement.  Treatment is still under consideration and per MD note hospice has been discussed and family will consider.   Daughter reports patient is sleeping a lot and missing small feeds because she sleeps so much. Bowels are regular and she is no longer having any blood in stool. Patient reports she can't eat a lot at one time, preferred foods now: Yogurt, Jello, doesn't drink milk "hurts stomach", scrambled eggs, sausage and bacon at breakfast, chicken tenders.  Doesn't eat much red meat. Drinking coffee,electrolyte drinks, Gingerale or Sprite, green tea. Urine is pale or clear most of the time. Using Enterex diabetic shakes (220 cal, 12 g pro) or Core Power (26-42g pro), also has some Orgain (21 g pro) powder  Nutrition Focused Physical Exam: unable to perform NFPE   Medications: Turmeric, B12, hair skin and nails, Vit D3, Probiotic, mushroom extract   Labs: 08/29/24  GFR 50, Creat 1.06   Anthropometrics: Some gradual weight loss past couple of years, with 10# (7%) past 6 months which is not clinically significant.  Height: 57 Weight:  09/17/24  122# (weight at home, patient weighs daily) 08/29/24  132# UBW: 145# BMI: 28.59   Estimated Energy Needs  Kcals: 1800-2100 Protein: 60-72 g Fluid: 2.5L   NUTRITION DIAGNOSIS: Food and Nutrition Related Knowledge Deficit related to cancer and associated treatments as evidenced by no prior need for nutrition related information.    INTERVENTION:   Encouraged alternating liquids with meals and eating/drinking every 2-3 hours. Encouraged high calorie liquids and use of Gingerale to aid with nausea. Encouraged 1-2 ONS daily (2 if using 12g pro, 1 if  using 30-40g pro).  Suggested getting samples of 350 cal ONS from [facility].  Goal is 20g pro per meal or drink TID.  Suggested mixing protein powders into applesauce or oatmeal.  Daughter's email. [email or web address].  MONITORING, EVALUATION, GOAL: weight trends, nutrition impact symptoms, PO intake, labs   Next Visit: Remote next month   Kathy Thornton, RDN, LDN Registered Dietitian, [facility] Part Time Remote (Usual office hours: Tuesday-Thursday) Mobile: [phone]

## 2024-09-18 ENCOUNTER — Telehealth: Payer: Self-pay | Admitting: Hematology & Oncology

## 2024-09-18 NOTE — Telephone Encounter (Signed)
 Lvm for patient to return call for scheduling appt with DR [name] early November with labs per inbasket.

## 2024-09-19 ENCOUNTER — Other Ambulatory Visit: Payer: Self-pay | Admitting: Cardiology

## 2024-09-19 DIAGNOSIS — I4891 Unspecified atrial fibrillation: Secondary | ICD-10-CM

## 2024-09-20 ENCOUNTER — Telehealth: Payer: Self-pay | Admitting: Cardiology

## 2024-09-20 ENCOUNTER — Other Ambulatory Visit: Payer: Self-pay

## 2024-09-20 DIAGNOSIS — I4891 Unspecified atrial fibrillation: Secondary | ICD-10-CM

## 2024-09-20 MED ORDER — APIXABAN 5 MG PO TABS: 5.0000 mg | ORAL_TABLET | Freq: Two times a day (BID) | ORAL | 1 refills | Status: DC

## 2024-09-20 NOTE — Telephone Encounter (Signed)
*  STAT* If patient is at the pharmacy, call can be transferred to refill team.   1. Which medications need to be refilled? (please list name of each medication and dose if known)  ELIQUIS  5 MG TABS tablet   2. Which pharmacy/location (including street and city if local pharmacy) is medication to be sent to? [id number] - [place], [place] - [address] AT [facility] [address]   3. Do they need a 30 day or 90 day supply?  90 day supply

## 2024-09-20 NOTE — Telephone Encounter (Signed)
 Prescription refill request for Eliquis  received. Indication:afib Last office visit:3/25 Scr:1.06  9/25 Age: 89 Weight:59.9  kg  Prescription refilled

## 2024-10-04 ENCOUNTER — Other Ambulatory Visit: Payer: Self-pay | Admitting: Hematology & Oncology

## 2024-10-09 ENCOUNTER — Encounter: Payer: Self-pay | Admitting: *Deleted

## 2024-10-09 NOTE — Progress Notes (Signed)
 Received a call from patient's daughter [name] that patient has had a decline in status over the last 7-10 days. She is unable to eat solid food. When she does eat liquids, she gets severe pain. She does have pain medication, but she sleeps for several hours after each dose. She continues to have some bloody stools and recently some nose bleeds as well. The family is interested in hospice support and equipment support in the home. Offered to placed hospice referral today, but [name] would like to get her mother to her appointment tomorrow, so they can all discuss current condition and come up with a plan.   Above shared with Dr [name].   Oncology Nurse Navigator Documentation     10/09/2024    3:00 PM  Oncology Nurse Navigator Flowsheets  Navigator Follow Up Date: 10/10/2024  Navigator Follow Up Reason: Follow-up Appointment  Navigator Location [facility]  Navigator Encounter Type Telephone  Telephone Incoming Call  Patient Visit Type MedOnc  Treatment Phase Abnormal Scans  Barriers/Navigation Needs Coordination of Care;Education  Education Other  Interventions Education;Psycho-Social Support  Acuity Level 2-Minimal Needs (1-2 Barriers Identified)  Support Groups/Services Friends and Family  Time Spent with Patient 30

## 2024-10-10 ENCOUNTER — Inpatient Hospital Stay: Attending: Hematology & Oncology

## 2024-10-10 ENCOUNTER — Inpatient Hospital Stay: Admitting: Hematology & Oncology

## 2024-10-10 ENCOUNTER — Encounter: Payer: Self-pay | Admitting: *Deleted

## 2024-10-10 ENCOUNTER — Encounter: Payer: Self-pay | Admitting: Hematology & Oncology

## 2024-10-10 ENCOUNTER — Other Ambulatory Visit: Payer: Self-pay

## 2024-10-10 VITALS — BP 141/58 | HR 78 | Temp 97.7°F | Resp 18 | Ht <= 58 in | Wt 128.0 lb

## 2024-10-10 DIAGNOSIS — Z79899 Other long term (current) drug therapy: Secondary | ICD-10-CM | POA: Insufficient documentation

## 2024-10-10 DIAGNOSIS — R519 Headache, unspecified: Secondary | ICD-10-CM | POA: Insufficient documentation

## 2024-10-10 DIAGNOSIS — R109 Unspecified abdominal pain: Secondary | ICD-10-CM | POA: Insufficient documentation

## 2024-10-10 DIAGNOSIS — Z7901 Long term (current) use of anticoagulants: Secondary | ICD-10-CM | POA: Insufficient documentation

## 2024-10-10 DIAGNOSIS — C50212 Malignant neoplasm of upper-inner quadrant of left female breast: Secondary | ICD-10-CM

## 2024-10-10 DIAGNOSIS — M791 Myalgia, unspecified site: Secondary | ICD-10-CM | POA: Insufficient documentation

## 2024-10-10 DIAGNOSIS — Z170 Estrogen receptor positive status [ER+]: Secondary | ICD-10-CM | POA: Diagnosis not present

## 2024-10-10 DIAGNOSIS — C259 Malignant neoplasm of pancreas, unspecified: Secondary | ICD-10-CM | POA: Diagnosis present

## 2024-10-10 DIAGNOSIS — R110 Nausea: Secondary | ICD-10-CM | POA: Insufficient documentation

## 2024-10-10 DIAGNOSIS — K921 Melena: Secondary | ICD-10-CM | POA: Insufficient documentation

## 2024-10-10 DIAGNOSIS — K769 Liver disease, unspecified: Secondary | ICD-10-CM | POA: Insufficient documentation

## 2024-10-10 DIAGNOSIS — K8689 Other specified diseases of pancreas: Secondary | ICD-10-CM

## 2024-10-10 DIAGNOSIS — R42 Dizziness and giddiness: Secondary | ICD-10-CM | POA: Insufficient documentation

## 2024-10-10 DIAGNOSIS — Z882 Allergy status to sulfonamides status: Secondary | ICD-10-CM | POA: Diagnosis not present

## 2024-10-10 LAB — LACTATE DEHYDROGENASE: LDH: 217 U/L — ABNORMAL HIGH (ref 98–192)

## 2024-10-10 LAB — CMP (CANCER CENTER ONLY)
ALT: 6 U/L (ref 0–44)
AST: 18 U/L (ref 15–41)
Albumin: 4.1 g/dL (ref 3.5–5.0)
Alkaline Phosphatase: 82 U/L (ref 38–126)
Anion gap: 12 (ref 5–15)
BUN: 20 mg/dL (ref 8–23)
CO2: 23 mmol/L (ref 22–32)
Calcium: 10 mg/dL (ref 8.9–10.3)
Chloride: 105 mmol/L (ref 98–111)
Creatinine: 1 mg/dL (ref 0.44–1.00)
GFR, Estimated: 54 mL/min — ABNORMAL LOW (ref >60)
Glucose, Bld: 125 mg/dL — ABNORMAL HIGH (ref 70–99)
Potassium: 4.5 mmol/L (ref 3.5–5.1)
Sodium: 140 mmol/L (ref 135–145)
Total Bilirubin: 0.4 mg/dL (ref 0.0–1.2)
Total Protein: 6.7 g/dL (ref 6.5–8.1)

## 2024-10-10 LAB — CBC WITH DIFFERENTIAL (CANCER CENTER ONLY)
Abs Immature Granulocytes: 0.03 K/uL (ref 0.00–0.07)
Basophils Absolute: 0.1 K/uL (ref 0.0–0.1)
Basophils Relative: 1 %
Eosinophils Absolute: 0.2 K/uL (ref 0.0–0.5)
Eosinophils Relative: 3 %
HCT: 29.1 % — ABNORMAL LOW (ref 36.0–46.0)
Hemoglobin: 9.6 g/dL — ABNORMAL LOW (ref 12.0–15.0)
Immature Granulocytes: 0 %
Lymphocytes Relative: 23 %
Lymphs Abs: 2 K/uL (ref 0.7–4.0)
MCH: 32 pg (ref 26.0–34.0)
MCHC: 33 g/dL (ref 30.0–36.0)
MCV: 97 fL (ref 80.0–100.0)
Monocytes Absolute: 0.8 K/uL (ref 0.1–1.0)
Monocytes Relative: 9 %
Neutro Abs: 5.4 K/uL (ref 1.7–7.7)
Neutrophils Relative %: 64 %
Platelet Count: 174 K/uL (ref 150–400)
RBC: 3 MIL/uL — ABNORMAL LOW (ref 3.87–5.11)
RDW: 14.5 % (ref 11.5–15.5)
WBC Count: 8.5 K/uL (ref 4.0–10.5)
nRBC: 0 % (ref 0.0–0.2)

## 2024-10-10 LAB — PREALBUMIN: Prealbumin: 10 mg/dL — ABNORMAL LOW (ref 18–38)

## 2024-10-10 MED ORDER — FENTANYL 12 MCG/HR TD PT72: 1.0000 | MEDICATED_PATCH | TRANSDERMAL | 0 refills | Status: DC

## 2024-10-10 NOTE — Progress Notes (Signed)
 Hematology and Oncology Follow Up Visit  Kathy Thornton [id number] 1934/04/01 89 y.o. 10/10/2024   Principle Diagnosis:  Metastatic adenocarcinoma of the pancreas  Current Therapy:   Observation     Interim History:  Ms. Oglesby is back for a second office visit.  We have personally saw her on the back and September.  At that time, it was not clear as to what we are actually dealing with.  However, after additional testing, it seemed to be suggestive and consistent with adenocarcinoma of the pancreas.  She has been declining over the past week or so.  She has been having some hematochezia.  She is on Eliquis .  I think we are to have to stop the Eliquis .  She has not been able to eat much.  She may have a lot of pain when she eats.  I suspect that a lot of this is secondary to her cancer progressing.  She has lost a little bit of weight.  She is having more in the way of pain.  I think we are to have to get her on a long-term pain medication.  I will consider getting her on a Duragesic  patch.  I also think that we probably are now looking at Hospice.  2 of her daughters came in with her.  We all agree that we need to focus on her comfort, respect, and dignity.  I think that we can probably do this with Hospice.  She just feels weak.  She has not been eating much.  I told her that I did not think that she is probably can be able to eat all that much now.  She has had no hematemesis or hemoptysis.  She has had no fever.  She has had no rashes.  There has been no cough.  She has been having occasional headaches.  Currently, I would have to say that her performance status is probably ECOG 2-3.  Medications:  Current Outpatient Medications:    fentaNYL  (DURAGESIC ) 12 MCG/HR, Place 1 patch onto the skin every 3 (three) days., Disp: 10 patch, Rfl: 0   acetaminophen  (TYLENOL ) 325 MG tablet, Take 2 tablets (650 mg total) by mouth every 6 (six) hours as needed., Disp: 36 tablet, Rfl: 0    acetaminophen -codeine (TYLENOL  #3) 300-30 MG tablet, Take 1-2 tablets by mouth every 8 (eight) hours as needed for moderate pain (pain score 4-6)., Disp: 60 tablet, Rfl: 0   apixaban  (ELIQUIS ) 5 MG TABS tablet, Take 1 tablet (5 mg total) by mouth 2 (two) times daily., Disp: 180 tablet, Rfl: 1   Ascorbic Acid (VITAMIN C) 500 MG CAPS, Take 500 mg by mouth daily., Disp: , Rfl:    Biotin 1 MG CAPS, Take 2 mg by mouth daily., Disp: , Rfl:    Cholecalciferol (VITAMIN D3 PO), Take by mouth daily. (Patient taking differently: Take 5,000 Units by mouth daily.), Disp: , Rfl:    furosemide  (LASIX ) 20 MG tablet, , Disp: , Rfl:    furosemide  (LASIX ) 40 MG tablet, Take 1 tablet (40 mg total) by mouth daily. (Patient not taking: Reported on 08/29/2024), Disp: 90 tablet, Rfl: 3   metoprolol  succinate (TOPROL -XL) 25 MG 24 hr tablet, TAKE 1 TABLET(25 MG) BY MOUTH DAILY, Disp: 90 tablet, Rfl: 3   Multiple Vitamins-Minerals (WOMENS MULTIVITAMIN PO), Take by mouth., Disp: , Rfl:    ondansetron  (ZOFRAN ) 4 MG tablet, Take 1 tablet (4 mg total) by mouth every 4 (four) hours as needed for nausea or vomiting.,  Disp: 12 tablet, Rfl: 0   pantoprazole  (PROTONIX ) 40 MG tablet, Take 1 tablet (40 mg total) by mouth 2 (two) times daily., Disp: 180 tablet, Rfl: 2   potassium chloride  (KLOR-CON ) 10 MEQ tablet, TAKE 1 TABLET BY MOUTH DAILY AS NEEDED WHEN TAKING FUROSEMIDE , Disp: 90 tablet, Rfl: 0   psyllium (METAMUCIL) 58.6 % packet, Take 1 packet by mouth daily., Disp: , Rfl:    sucralfate  (CARAFATE ) 1 g tablet, Take 1 tablet (1 g total) by mouth with breakfast, with lunch, and with evening meal., Disp: 90 tablet, Rfl: 4  Allergies:  Allergies  Allergen Reactions   Sulfa Antibiotics Hives    Past Medical History, Surgical history, Social history, and Family History were reviewed and updated.  Review of Systems: Review of Systems  Constitutional:  Positive for appetite change and unexpected weight change.  HENT:  Negative.     Eyes: Negative.   Respiratory: Negative.    Cardiovascular: Negative.   Gastrointestinal:  Positive for abdominal pain, blood in stool and nausea.  Endocrine: Negative.   Genitourinary: Negative.    Musculoskeletal:  Positive for myalgias.  Skin: Negative.   Neurological:  Positive for dizziness.  Hematological: Negative.   Psychiatric/Behavioral: Negative.      Physical Exam:  height is 4' 9 (1.448 m) and weight is 128 lb (58.1 kg). Her oral temperature is 97.7 F (36.5 C). Her blood pressure is 141/58 (abnormal) and her pulse is 78. Her respiration is 18 and oxygen saturation is 99%.   Wt Readings from Last 3 Encounters:  10/10/24 128 lb (58.1 kg)  08/29/24 132 lb 1.9 oz (59.9 kg)  07/31/24 137 lb (62.1 kg)    Physical Exam Vitals reviewed.  HENT:     Head: Normocephalic and atraumatic.  Eyes:     Pupils: Pupils are equal, round, and reactive to light.  Cardiovascular:     Rate and Rhythm: Normal rate and regular rhythm.     Heart sounds: Normal heart sounds.  Pulmonary:     Effort: Pulmonary effort is normal.     Breath sounds: Normal breath sounds.  Abdominal:     General: Bowel sounds are normal.     Palpations: Abdomen is soft.  Musculoskeletal:        General: No tenderness or deformity. Normal range of motion.     Cervical back: Normal range of motion.  Lymphadenopathy:     Cervical: No cervical adenopathy.  Skin:    General: Skin is warm and dry.     Findings: No erythema or rash.  Neurological:     Mental Status: She is alert and oriented to person, place, and time.  Psychiatric:        Behavior: Behavior normal.        Thought Content: Thought content normal.        Judgment: Judgment normal.      Lab Results  Component Value Date   WBC 8.5 10/10/2024   HGB 9.6 (L) 10/10/2024   HCT 29.1 (L) 10/10/2024   MCV 97.0 10/10/2024   PLT 174 10/10/2024     Chemistry      Component Value Date/Time   NA 140 10/10/2024 1515   NA 141 10/20/2023  1141   K 4.5 10/10/2024 1515   CL 105 10/10/2024 1515   CO2 23 10/10/2024 1515   BUN 20 10/10/2024 1515   BUN 15 10/20/2023 1141   CREATININE 1.00 10/10/2024 1515   CREATININE 1.11 (H) 01/19/2016 1126  Component Value Date/Time   CALCIUM 10.0 10/10/2024 1515   ALKPHOS 82 10/10/2024 1515   AST 18 10/10/2024 1515   ALT 6 10/10/2024 1515   BILITOT 0.4 10/10/2024 1515      Impression and Plan: Ms. Sheaffer is a very nice 89 year old white female.  She has metastatic pancreatic cancer.  She is declining which I am not surprised by.  She she has done quite well.  Again, at this point, I can certainly consider her for Hospice.  I think she would be a great candidate for Hospice.  Again we will get a stop the Eliquis .  I really think that this is going to be more of a problem then a solution for her.  I think with her liver disease, that she probably has a somewhat thin blood.  Again, I think that Hospice would be a very good option for her.  I will see about getting hospice involved and see if they cannot call her.  I did not discuss end-of-life issues right now.  Hopefully, the Duragesic  patch will help her out.  Against a very low dose.  I told her that I would be somewhat surprised if she mated to the end of the year.  Again she has declined somewhat since I first saw her.  I think that she probably will not want to eat much.  I would not force her to eat.  When she wants to eat I think that would be reasonable.  I just do not think she could eat large amounts.  She really has done nicely considering the underlying process.  I told her family that they can always give us  a call if she does have any problems.  I know that Hospice will do a very good job with her.   Maude JONELLE Crease, MD 11/6/20255:16 PM

## 2024-10-11 ENCOUNTER — Telehealth: Payer: Self-pay | Admitting: Cardiology

## 2024-10-11 NOTE — Telephone Encounter (Signed)
 Pts daughter calling to let Dr. Sheena know she has been diagnosed with pancreatic cancer and starting hospice care. Her medications have been adjusted, she is no longer taking Eliquis . Daughter ask they call her for any questions. Please advise.

## 2024-10-11 NOTE — Telephone Encounter (Signed)
Message sent to Dr.Tobb. 

## 2024-10-11 NOTE — Progress Notes (Signed)
 Patient and family have decided to seek out hospice care. Will discontinue active navigation at this time but be available to the patient/family as needed.   Oncology Nurse Navigator Documentation     10/10/2024    3:30 PM  Oncology Nurse Navigator Flowsheets  Navigation Complete Date: 10/11/2024  Post Navigation: Continue to Follow Patient? No  Reason Not Navigating Patient: Pharmacologist Encounter Type Follow-up Appt  Interventions Referrals  Referrals Other  Time Spent with Patient 15

## 2024-10-17 ENCOUNTER — Telehealth: Payer: Self-pay | Admitting: Dietician

## 2024-10-17 ENCOUNTER — Inpatient Hospital Stay: Admitting: Dietician

## 2024-10-17 ENCOUNTER — Ambulatory Visit: Admitting: Cardiology

## 2024-10-17 NOTE — Telephone Encounter (Signed)
 Attempted to reach patient's daughter for a scheduled remote nutrition consult. Patient now under hospice care.  Offered to address any questions or concerns.  With goals of comfort let daughter know there was no needs to suggest ONS or extra supplements, and to let patient eat when she wants when she wants.  Offered support if there were needs in for.  [name], RDN, LDN Registered Dietitian, [facility] Part Time Remote (Usual office hours: Tuesday-Thursday) Cell: [phone]

## 2024-11-26 ENCOUNTER — Telehealth: Payer: Self-pay | Admitting: Family Medicine

## 2024-11-26 ENCOUNTER — Telehealth: Payer: Self-pay | Admitting: *Deleted

## 2024-12-05 NOTE — Telephone Encounter (Signed)
 Called and spoke with Kathy Thornton and offered my deepest condolences to her and her father

## 2024-12-05 NOTE — Telephone Encounter (Signed)
 Received notification of patient death 10-Dec-2024 at 75am via fax from [facility]

## 2024-12-05 DEATH — deceased

## 2025-01-29 ENCOUNTER — Ambulatory Visit: Admitting: Family Medicine
